# Patient Record
Sex: Female | Born: 1985 | Race: Black or African American | Hispanic: No | Marital: Married | Smoking: Former smoker
Health system: Southern US, Community
[De-identification: ages and names within clinical notes are randomized; demographics above are authoritative.]

## PROBLEM LIST (undated history)

## (undated) ENCOUNTER — Inpatient Hospital Stay (HOSPITAL_COMMUNITY): Payer: BC Managed Care – PPO

## (undated) DIAGNOSIS — F32A Depression, unspecified: Secondary | ICD-10-CM

## (undated) DIAGNOSIS — E1165 Type 2 diabetes mellitus with hyperglycemia: Secondary | ICD-10-CM

## (undated) DIAGNOSIS — F418 Other specified anxiety disorders: Secondary | ICD-10-CM

## (undated) DIAGNOSIS — E119 Type 2 diabetes mellitus without complications: Secondary | ICD-10-CM

## (undated) DIAGNOSIS — I10 Essential (primary) hypertension: Secondary | ICD-10-CM

## (undated) DIAGNOSIS — F419 Anxiety disorder, unspecified: Secondary | ICD-10-CM

## (undated) DIAGNOSIS — F329 Major depressive disorder, single episode, unspecified: Secondary | ICD-10-CM

## (undated) DIAGNOSIS — N970 Female infertility associated with anovulation: Secondary | ICD-10-CM

## (undated) HISTORY — DX: Other specified anxiety disorders: F41.8

## (undated) HISTORY — DX: Female infertility associated with anovulation: N97.0

## (undated) HISTORY — DX: Type 2 diabetes mellitus with hyperglycemia: E11.65

## (undated) HISTORY — DX: Essential (primary) hypertension: I10

## (undated) HISTORY — PX: CHOLECYSTECTOMY: SHX55

## (undated) HISTORY — DX: Morbid (severe) obesity due to excess calories: E66.01

---
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
## 2003-03-10 ENCOUNTER — Emergency Department (HOSPITAL_COMMUNITY): Admission: EM | Admit: 2003-03-10 | Discharge: 2003-03-10 | Payer: Self-pay | Admitting: Emergency Medicine

## 2003-03-10 ENCOUNTER — Encounter: Payer: Self-pay | Admitting: Emergency Medicine

## 2003-07-05 ENCOUNTER — Encounter: Admission: RE | Admit: 2003-07-05 | Discharge: 2003-07-05 | Payer: Self-pay | Admitting: Family Medicine

## 2003-07-05 ENCOUNTER — Other Ambulatory Visit: Admission: RE | Admit: 2003-07-05 | Discharge: 2003-07-05 | Payer: Self-pay | Admitting: Family Medicine

## 2003-07-05 ENCOUNTER — Encounter (INDEPENDENT_AMBULATORY_CARE_PROVIDER_SITE_OTHER): Payer: Self-pay | Admitting: *Deleted

## 2003-12-16 ENCOUNTER — Emergency Department (HOSPITAL_COMMUNITY): Admission: EM | Admit: 2003-12-16 | Discharge: 2003-12-16 | Payer: Self-pay | Admitting: Emergency Medicine

## 2004-04-04 ENCOUNTER — Encounter: Admission: RE | Admit: 2004-04-04 | Discharge: 2004-04-04 | Payer: Self-pay | Admitting: Family Medicine

## 2004-04-13 ENCOUNTER — Encounter: Admission: RE | Admit: 2004-04-13 | Discharge: 2004-04-13 | Payer: Self-pay | Admitting: Sports Medicine

## 2004-06-15 ENCOUNTER — Emergency Department (HOSPITAL_COMMUNITY): Admission: EM | Admit: 2004-06-15 | Discharge: 2004-06-15 | Payer: Self-pay | Admitting: *Deleted

## 2004-06-20 ENCOUNTER — Encounter: Admission: RE | Admit: 2004-06-20 | Discharge: 2004-06-20 | Payer: Self-pay | Admitting: Family Medicine

## 2004-06-20 ENCOUNTER — Other Ambulatory Visit: Admission: RE | Admit: 2004-06-20 | Discharge: 2004-06-20 | Payer: Self-pay | Admitting: Family Medicine

## 2004-06-20 ENCOUNTER — Encounter (INDEPENDENT_AMBULATORY_CARE_PROVIDER_SITE_OTHER): Payer: Self-pay | Admitting: *Deleted

## 2004-06-23 ENCOUNTER — Encounter: Admission: RE | Admit: 2004-06-23 | Discharge: 2004-06-23 | Payer: Self-pay | Admitting: Sports Medicine

## 2004-07-03 ENCOUNTER — Encounter (INDEPENDENT_AMBULATORY_CARE_PROVIDER_SITE_OTHER): Payer: Self-pay | Admitting: *Deleted

## 2004-07-03 LAB — CONVERTED CEMR LAB

## 2004-09-06 ENCOUNTER — Ambulatory Visit: Payer: Self-pay | Admitting: Family Medicine

## 2005-02-22 ENCOUNTER — Emergency Department (HOSPITAL_COMMUNITY): Admission: EM | Admit: 2005-02-22 | Discharge: 2005-02-22 | Payer: Self-pay | Admitting: Family Medicine

## 2005-02-26 ENCOUNTER — Ambulatory Visit: Payer: Self-pay | Admitting: Family Medicine

## 2007-01-31 ENCOUNTER — Encounter (INDEPENDENT_AMBULATORY_CARE_PROVIDER_SITE_OTHER): Payer: Self-pay | Admitting: *Deleted

## 2007-02-13 ENCOUNTER — Emergency Department (HOSPITAL_COMMUNITY): Admission: EM | Admit: 2007-02-13 | Discharge: 2007-02-13 | Payer: Self-pay | Admitting: Family Medicine

## 2007-07-06 ENCOUNTER — Emergency Department (HOSPITAL_COMMUNITY): Admission: EM | Admit: 2007-07-06 | Discharge: 2007-07-06 | Payer: Self-pay | Admitting: Emergency Medicine

## 2007-12-29 ENCOUNTER — Emergency Department (HOSPITAL_COMMUNITY): Admission: EM | Admit: 2007-12-29 | Discharge: 2007-12-29 | Payer: Self-pay | Admitting: Emergency Medicine

## 2008-05-02 ENCOUNTER — Inpatient Hospital Stay (HOSPITAL_COMMUNITY): Admission: AD | Admit: 2008-05-02 | Discharge: 2008-05-02 | Payer: Self-pay | Admitting: Obstetrics and Gynecology

## 2008-05-06 ENCOUNTER — Ambulatory Visit: Payer: Self-pay | Admitting: *Deleted

## 2008-05-06 DIAGNOSIS — E1169 Type 2 diabetes mellitus with other specified complication: Secondary | ICD-10-CM | POA: Insufficient documentation

## 2008-05-06 DIAGNOSIS — I10 Essential (primary) hypertension: Secondary | ICD-10-CM | POA: Insufficient documentation

## 2008-05-06 DIAGNOSIS — E119 Type 2 diabetes mellitus without complications: Secondary | ICD-10-CM | POA: Insufficient documentation

## 2008-05-06 DIAGNOSIS — N939 Abnormal uterine and vaginal bleeding, unspecified: Secondary | ICD-10-CM

## 2008-05-06 DIAGNOSIS — E1165 Type 2 diabetes mellitus with hyperglycemia: Secondary | ICD-10-CM

## 2008-05-06 DIAGNOSIS — IMO0002 Reserved for concepts with insufficient information to code with codable children: Secondary | ICD-10-CM

## 2008-05-06 DIAGNOSIS — N926 Irregular menstruation, unspecified: Secondary | ICD-10-CM | POA: Insufficient documentation

## 2008-05-06 HISTORY — DX: Type 2 diabetes mellitus with hyperglycemia: E11.65

## 2008-05-06 HISTORY — DX: Morbid (severe) obesity due to excess calories: E66.01

## 2008-05-06 HISTORY — DX: Essential (primary) hypertension: I10

## 2008-05-06 HISTORY — DX: Reserved for concepts with insufficient information to code with codable children: IMO0002

## 2008-05-06 LAB — CONVERTED CEMR LAB
Beta hcg, urine, semiquantitative: NEGATIVE
FSH: 6.4 milliintl units/mL
HCT: 37.4 % (ref 36.0–46.0)
Hemoglobin: 11.8 g/dL — ABNORMAL LOW (ref 12.0–15.0)
LH: 10.9 milliintl units/mL
MCHC: 31.6 g/dL (ref 30.0–36.0)
MCV: 83.7 fL (ref 78.0–100.0)
Platelets: 322 10*3/uL (ref 150–400)
Prolactin: 8.4 ng/mL
RBC: 4.47 M/uL (ref 3.87–5.11)
RDW: 14.9 % (ref 11.5–15.5)
Sex Hormone Binding: 26 nmol/L (ref 18–114)
TSH: 1.745 microintl units/mL (ref 0.350–5.50)
Testosterone Free: 11.4 pg/mL — ABNORMAL HIGH (ref 0.6–6.8)
Testosterone-% Free: 2.1 % (ref 0.4–2.4)
Testosterone: 55.44 ng/dL (ref 10–70)
WBC: 5.6 10*3/uL (ref 4.0–10.5)

## 2008-07-29 ENCOUNTER — Emergency Department (HOSPITAL_COMMUNITY): Admission: EM | Admit: 2008-07-29 | Discharge: 2008-07-29 | Payer: Self-pay | Admitting: Emergency Medicine

## 2008-09-01 ENCOUNTER — Encounter: Admission: RE | Admit: 2008-09-01 | Discharge: 2008-10-07 | Payer: Self-pay | Admitting: Occupational Medicine

## 2009-01-24 ENCOUNTER — Ambulatory Visit: Payer: Self-pay | Admitting: Family Medicine

## 2009-01-25 ENCOUNTER — Encounter: Payer: Self-pay | Admitting: Family Medicine

## 2009-02-07 ENCOUNTER — Ambulatory Visit: Payer: Self-pay | Admitting: Family Medicine

## 2009-02-10 ENCOUNTER — Encounter: Payer: Self-pay | Admitting: Sports Medicine

## 2009-08-12 ENCOUNTER — Encounter: Admission: RE | Admit: 2009-08-12 | Discharge: 2009-08-12 | Payer: Self-pay | Admitting: Internal Medicine

## 2009-09-09 ENCOUNTER — Ambulatory Visit (HOSPITAL_COMMUNITY): Admission: RE | Admit: 2009-09-09 | Discharge: 2009-09-09 | Payer: Self-pay | Admitting: Obstetrics

## 2009-12-09 ENCOUNTER — Encounter: Admission: RE | Admit: 2009-12-09 | Discharge: 2009-12-09 | Payer: Self-pay | Admitting: Occupational Medicine

## 2009-12-09 ENCOUNTER — Encounter: Payer: Self-pay | Admitting: Family Medicine

## 2010-01-09 ENCOUNTER — Ambulatory Visit: Payer: Self-pay | Admitting: Family Medicine

## 2010-01-09 DIAGNOSIS — M25559 Pain in unspecified hip: Secondary | ICD-10-CM | POA: Insufficient documentation

## 2010-01-09 DIAGNOSIS — M25569 Pain in unspecified knee: Secondary | ICD-10-CM | POA: Insufficient documentation

## 2010-01-10 ENCOUNTER — Encounter: Payer: Self-pay | Admitting: Family Medicine

## 2010-05-10 ENCOUNTER — Encounter: Payer: Self-pay | Admitting: Family Medicine

## 2010-07-05 ENCOUNTER — Ambulatory Visit: Payer: Self-pay | Admitting: Obstetrics & Gynecology

## 2010-07-05 ENCOUNTER — Inpatient Hospital Stay (HOSPITAL_COMMUNITY): Admission: AD | Admit: 2010-07-05 | Discharge: 2010-07-05 | Payer: Self-pay | Admitting: Obstetrics & Gynecology

## 2010-10-04 ENCOUNTER — Encounter
Admission: RE | Admit: 2010-10-04 | Discharge: 2010-11-21 | Payer: Self-pay | Source: Home / Self Care | Attending: Occupational Medicine | Admitting: Occupational Medicine

## 2011-01-02 NOTE — Miscellaneous (Signed)
Summary: [facility]  [facility]   Imported ByRanda Evens WATT 02/10/2009 11:21:21  _____________________________________________________________________  External Attachment:    Type:   Image     Comment:   External Document

## 2011-01-02 NOTE — Letter (Signed)
Summary: Generic Letter  [facility]  [address]   [place], [place] [address]   Phone: [phone]  Fax: [phone]    05/10/2010  ANDRENA MARGERUM [facility] [address] [place], [place]  [address]  Dear Ms. ISLEY,   We have not seen you at the [facility] clinic in over a year.  With your diabetes and blood pressure it is important to have regular medical care.  Please call us for an appointment or call us to let us know if you have found another doctor.     Sincerely,   Eustaquio Boyden  MD   Appended Document: Generic Letter letter mailed.

## 2011-01-02 NOTE — Assessment & Plan Note (Signed)
Summary: NP W/LEFT KNEE/HIP PAIN WORKERS COMP/JW   Vital Signs:  Patient Profile:   25 Years Old Female Weight:      363 pounds Pulse rate:   88 / minute BP sitting:   142 / 81  Vitals Entered By: Lillia Pauls CMA (January 24, 2009 3:45 PM)                 PCP:  Wilburt Finlay MD  Chief Complaint:  NP WC WITH L HIP AND L KNEE PAIN.  History of Present Illness: new patient sent for further evaluation and management by Dr Marny Lowenstein of left knee and hip pain, initial date of injury 07/29/08.  Fell forward onto knees on a carpeted (over cement or tile she thinks) floor. Had immediate knee pain and swelling. No initial hip pain, that occurred a few weeks later.  Knee pain is sharp,intermittent, at times is 10/10, other times 4/10. worse with prolonged standing or stair climbing. Hurts too bad to kneel on that knee. Initially had a lot of swelling--now intermittently it feels somewhat tight and looks a little puffy. Knee does not fell like it is going to give out, no leg weakness  After a few weeks from her initial fall she began to have rleft hip pain--that has worsened over time. she describes it as aching, always present, 6-8/10, worse with movement or extended standing. No giving way or catching sensation.  Pertinent PMH/PSH :long hx of morbid obesity, no hx of injurty or surgery to either hip or knee. Pertinent SH: she works 2 jobs, does a lot of standing and weight bearing.    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 Discussed possible complications of infection, bleeding or skin atrophy at site of injection. Possible side effect of avascular  necrosis (focal area of bone death), site atrophy, skin lightening  due to steroid use.Appropriate verbal time out taken Are cleaned and prepped in usual sterile fashion. A -2 cc--- cc kennalog plus ---4-cc 1% lidocaine without epinephrine was injected into the---left greater trochanteric bursa using a 3 1/2 inch 22 g spinal needle. Patient tolerated procedure well with no complications.   Patient given informed consent for injection. Discussed possible complications of infection, bleeding or skin atrophy at site of injection. Possible side effect of avascular necrosis (focal area of bone death) due to steroid use.Appropriate verbal time out taken Are cleaned and prepped in usual sterile fashion. A -1/2 cc--- cc kennalog plus ---1.5 cc-cc 1% lidocaine without epinephrine was injected into the--prepatellar bursa-. Patient tolerated procedure well with no complications.    Knee Exam  Knee Exam:    Right:    Inspection:  Normal    Palpation:  Normal    Left:    Palpation:  Abnormal    Stability:  stable    Tenderness:  patellar    Swelling:  no    Erythema:  no    left knee is very tender over area of pre patellar bursa. no tenderness of medial or lateral joint lines. Knee seems  ligamentously intact and I see no evident joint effusion--this exam is a little limited by habitus so we did US exam of knee and saw no effusion--there was a large collection of fluid in teh prepatellar bursa   Hip Exam  Hip Exam:    Right:    Inspection:  Normal    Palpation:  Normal    Stability:  stable    Tenderness:  no    Swelling:  no    Erythema:  no    Range of Motion:       Flexion-Active: 135       Extension-Active: 50       Internal Rotation-Active: 35       External Rotation-Active: 80    Left:    Inspection:  Normal    Palpation:  Abnormal       Location:  trochanteric bursa    Stability:  stable    Tenderness:  trochanteric bursa    Swelling:  no    Erythema:  no    Range of Motion:        Flexion-Active: 135       Extension-Active: 50       Internal Rotation-Active: 35       External Rotation-Active: 80    No pain on IR or ER. She has a little limit to her ROM due to habitus, but symmetrical motion r and l sides.      Impression & Recommendations:  Problem # 1:  GREATER TROCHANTERIC BURSITIS (ICD-726.5) Assessment: New I suspect her hip is bothering her from altered gait after her knee injury. we did injection today and will f/u in 1 week at which time we will discuss rehab program Orders: Joint Aspirate / Injection, Intermediate (20605)   Problem # 2:  BURSITIS, LEFT KNEE (ICD-726.60) prepatellar bursitis which sounds chronic at this point. Will try injection--rtc 1 week. discussed weight loss. Will discuss knee strengthenong program at next visit Orders: Joint Aspirate / Injection, Small ([id number])   Complete Medication List: 1)  Ortho-cyclen (28) 0.25-35 Mg-mcg Tabs (Norgestimate-eth estradiol) .... One tablet by mouth once daily as directed

## 2011-01-02 NOTE — Assessment & Plan Note (Signed)
Summary: FU HIP/JW   Vital Signs:  Patient Profile:   25 Years Old Female Pulse rate:   66 / minute BP sitting:   140 / 82  Vitals Entered By: Lillia Pauls CMA (February 07, 2009 3:03 PM)                 PCP:  Wilburt Finlay MD  Chief Complaint:  FU HIP PAIN.  History of Present Illness: f/u knee and hip pain. Both injections helped her--her knee is 75% better and her hip about 50% better. We had talked about starting a rehab program and she is ready for instruction        Physical Exam  General:     overweight-appearing.     Hip Exam  Hip Exam:    Right:    Inspection:  Normal    Palpation:  Normal    Left:    Inspection:  Normal       Location:  mild tenderness to deep palpation over greater trochanteric areas B   Knee Exam  Knee Exam:    Right:    Inspection:  Normal       Location:  very slight  tenderness prepatellar bursa but improved from previous exam    Painless full flexion and extension of knee.    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 One tablet by mouth once daily as directed

## 2011-01-02 NOTE — Letter (Signed)
Summary: *Consult Note  [facility]  [address]   [place], [place] [address]   Phone: [phone]  Fax: [phone]    Re:    Krista Adams DOB:    July 10, 1986   Dear:    Danae Chen you for requesting that we see the above patient for consultation.  A copy of the detailed office note will be sent under separate cover, for your review.  Evaluation today is consistent with: chronic prepatellar bursitis, greater trochanteric (hip ) bursitis   Our recommendation is for: we did injection therapy at todays visit--will consider rehab when she returns and see how she did with the injectiom. We also briefly discussed her obesity which she is aware contributes to the problem.   New Orders include: NONE   New Medications started today include: NONE:    After today's visit, the patients current medications include: 1)  ORTHO-CYCLEN (28) 0.25-35 MG-MCG  TABS (NORGESTIMATE-ETH ESTRADIOL) One tablet by mouth once daily as directed   Thank you for this consultation.  If you have any further questions regarding the care of this patient, please do not hesitate to contact me @ [phone] pager [phone]  Thank you for this opportunity to look after your patient.  Sincerely,   Denny Levy MD

## 2011-01-02 NOTE — Letter (Signed)
Summary: *Consult Note  [facility]  [address]   [place], [place] [address]   Phone: [phone]  Fax: [phone]    Re:    Krista Adams DOB:    04-24-1986   Dear:    Danae Chen you for requesting that we see the above patient for consultation.  A copy of the detailed office note will be sent under separate cover, for your review.  Evaluation today is consistent with: 1)knee contusion, no evidence of pre-patellar bursitis.2) chronic greater trochanteric bursitis.   Our recommendation is for:1) No work limitations.  2)weight reduction.  3)resume exercise program for greater trochanteric bursitis   N o medicine changes. I have referred her back to you for further discussion of work status.  After today's visit, the patients current medications include: 1)  ORTHO-CYCLEN (28) 0.25-35 MG-MCG  TABS (NORGESTIMATE-ETH ESTRADIOL) One tablet by mouth once daily as directed   Thank you for this consultation.  If you have any further questions regarding the care of this patient, please do not hesitate to contact me @ [phone].  Thank you for this opportunity to look after your patient.  Sincerely,   Denny Levy MD

## 2011-01-02 NOTE — Assessment & Plan Note (Signed)
Summary: WC,LEG/HIP PAIN,MC   Vital Signs:  Patient profile:   25 year old female Height:      65 inches Weight:      350 pounds BMI:     58.45 BP sitting:   133 / 85  Vitals Entered By: Lillia Pauls CMA (January 09, 2010 3:28 PM)  Primary Care Provider:  Eustaquio Boyden  MD  CC:  L knee and hip pain.  History of Present Illness: Seen in consult at request of Dr Alto Denver for furthr eval/management of left knee and left hip pain.  25 y/o morbidly obese female here for L knee and hip pain following fall at work1/06/2010. Second fall at work in lthe last 18 months. Had a similar injury >1 year ago. pt  evaluated at [facility] then and was given steroid injections in her knee and hip which she states did not help. She reports some degree of knee and hip pain have persisted since then,  She describes pain over left knee cap  area . Also  has  pain occasional shoots up in L hip area. pain Both of these types of pain areworsened by prolonged standing, but not necessarily made worse by walking. No or sensation that her knee is going to give out. Describes pain as 8/10. rest helps some.  She is currently working seated duty only.  Allergies (verified): No Known Drug Allergies  Review of Systems  The patient denies fever, weight loss, weight gain, muscle weakness, and difficulty walking.    Physical Exam  General:  morbidly obese female, NAD.  Msk:  Left knee has no obvious effusion, however exam is limited by habitus. She will barely let me examine her knee--even just palpating the patella and joint lines, because she says it is too painful. For this reasone, and with her informed consent, I injected 1 1/2 cc of 1% lidocaine into the pore-patellar bursa area so we could perform necessary exam maneuvers. After the injection, which she tolerated very well, exam of her patella revealed no ballottment, no crepitus. Her medial and lateral joint line were non tender to deep palpation. She has full  extension and flexion. The calf is non tender. Anterior drawer test is nomral with good end point.   Appley compression test was negative.  B hips have intact and full IR/ER that is painless. Minimally tender to palpation over area of greater trochanteric bursa. Gait is a bit wide stanced secondary to her habitus, but otherwise normal.   Additional Exam:  US exam was performed. Exam limited somewhat by large amount of adiposity but no effusion was noted. No mensical pathology was noted. The popliteal space did not have any cystic lesions.   Impression & Recommendations:  Problem # 1:  KNEE PAIN, LEFT (ICD-719.46) no evidence of bursitis on exam or U/S imaging. I see no obvious meniscal pathology and combined with negative clinical exam I think this is reassuring that we are not dealing with an acute meniscal tear. Given her habitus, she likely has some relatively early thinning and degeneration of both knee menisci, but I do not see anything acute. I think her pain is from a knee contusion and discussed this with her. She seemed reassured to some extent. Issuspect that it will continue to be painful for next 4-6 weeks (natural course of bone bruise may take 2-3 months before pain resolves totally. She does not seem to be in danger of further injuring her knee by returning to regular work status. I  told her to check back with you regarding her work capacity/status.  Problem # 2:  HIP PAIN, LEFT (ICD-719.45) patient reports minimal improvement with steroid injection in past; therefore, injection deferred. no further work up for now. She likely has some component of chronic greater trochanteric bursitis. We discussed briefly with her that weight reduction may help this and I advised her to return to her exercises (esp the ABduction leg raises form a lying on side position. She expressed understanding.  Complete Medication List: 1)  Ortho-cyclen (28) 0.25-35 Mg-mcg Tabs (Norgestimate-eth estradiol)  .... One tablet by mouth once daily as directed

## 2011-01-02 NOTE — Assessment & Plan Note (Signed)
Summary: bleeding x 3 wks wp   Vital Signs:  Patient Profile:   25 Years Old Female Weight:      346.2 pounds Temp:     98.4 degrees F Pulse rate:   84 / minute BP sitting:   138 / 85  (left arm)  Pt. in pain?   no  Vitals Entered By: Jacki Cones RN (May 06, 2008 3:53 PM)                  Visit Type:  Acute visit PCP:  Wilburt Finlay MD  Chief Complaint:  vaginal bleeding x3 weeks.  History of Present Illness:     This is a 25 year old female gravid 0 presenting with vaginal bleeding for three weeks.  She states she was seen at [facility] on Sunday and was evaluated with ultrasound and blood tests.  She was set up with an appointment at GYN clinic at [facility] next week, but she is leaving to go to [place] for a week and wanted to be seen earlier.  She states she has had bleeding requiring two to three pads/tampons a day for the past three weeks.  She has also noted some clots.  She states her periods are very irregular and it was three months time between this current episode of bleeding and her last period.   Her periods are anywhere from 1-3 months apart.  She states they have always been about seven days duration requiring 2-3 pads on the heaviest day.  She has never had bleeding that soaks through to her underwaer or to the sheets when sleeping.  In review of her records the [place] showed a uterus that is normal in size with normal endometrium.  Ovaries were dificult to visualize.  Her hemoglobin was in the 12 range.  She does have a history of hypertension and diabetes.  She denies smoking or history of deep venous thromboembolism.      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  CV      Denies chest pain or discomfort, fainting, fatigue, and lightheadness.  Resp      Denies cough.  GI      Complains of abdominal pain.      Denies change in bowel habits, constipation, diarrhea, nausea, and vomiting.  GU      Complains of abnormal vaginal bleeding.      Denies discharge and dysuria.   Physical Exam  General:     Well-appearing, morbidly obese female in no distress Head:     Normocephalic and atraumatic without obvious abnormalities. No apparent alopecia or balding. Lungs:     Normal respiratory effort, chest expands symmetrically. Lungs are clear to auscultation, no crackles or wheezes. Heart:     Normal rate and regular rhythm. S1 and S2 normal without gallop, murmur, click, rub or other extra sounds. Abdomen:     Soft, obese, mild  tenderness in suprapubic and bilateral lower quadrants without rebound tenderness or guarding.  Bowel sounds noted in all quadrants. Genitalia:     Normal external genitalia.  Vaginal mucosa is pink and moist.  Small amoutn of dark red blood in vagina.  Cervix is midline with blood from os.  Uterus and ovaries not palpable on bimanual exam secondary to body habitus.  Mild tenderness with displacement of uterus anteriorly and to right.   Neurologic:     No cranial nerve deficits noted. Station and gait are normal. Plantar reflexes are down-going bilaterally. DTRs are symmetrical throughout. Sensory, motor and coordinative functions appear intact. Psych:     Cognition and judgment appear intact. Alert and cooperative with normal attention span and concentration. No apparent delusions, illusions, hallucinations    Impression & Recommendations:  Problem # 1:  VAGINAL BLEEDING, ABNORMAL (ICD-626.9)    Will evaluate  today for sources of abnormal bleeding with TSH, prolaction, FSH, LH, testosterone.  Urine pregnancy negative.  Though limited ultrasoudn in past week was normal.  Patient likely with PCOS.  Will give prescription of OCP to control bleeding for now, but patient needs close follow-up.  She understands this and plans to follow at at clinic and at [facility] GYN clinic.   Her updated medication list for this problem includes:    Ortho-cyclen (28) 0.25-35 Mg-mcg Tabs (Norgestimate-eth estradiol) ..... One tablet by mouth once daily as directed  Orders: U Preg-[facility] (81025) TSH-[facility] [phone]) Prolactin-[facility] [phone]) Testosterone-[facility] [phone]) Miscellaneous Lab Charge-[facility] [phone]) CBC-[facility] (20254) [facility]- Est Level  3 (27062) LH-[facility] ([id number]) FSH-[facility] ([id number])   Problem # 2:  MORBID OBESITY (ICD-278.01)  Orders: [facility]- Est Level  3 (26948)   Problem # 3:  DIABETES MELLITUS, TYPE II, CONTROLLED (ICD-250.00) On Janumet but does not know dosing.  Problem # 4:  ESSENTIAL HYPERTENSION, BENIGN (ICD-401.1) On Benicar-HCT but does not know dosing.    Complete Medication List: 1)  Ortho-cyclen (28) 0.25-35 Mg-mcg Tabs (Norgestimate-eth estradiol) .... One tablet by mouth once daily as directed   Patient Instructions: 1)  Please schedule a follow-up appointment in 2 weeks. 2)  Use birth control pill as directed.   3)  Follow-up sooner if you have any concerns.     Prescriptions: ORTHO-CYCLEN (28) 0.25-35 MG-MCG  TABS (NORGESTIMATE-ETH ESTRADIOL) One tablet by mouth once daily as directed  #One pack x 1   Entered and Authorized by:   Karlton Lemon MD   Signed by:   Karlton Lemon MD on 05/06/2008   Method used:   Print then Give to Patient   RxID:   [id number]  ] Laboratory Results   Urine Tests  Date/Time Received: May 06, 2008 4:23 PM  Date/Time Reported: May 06, 2008 4:39 PM     Urine HCG: negative Comments: ...........test performed  by...........Marland KitchenTerese Door, CMA

## 2011-01-02 NOTE — Consult Note (Signed)
Summary: [facility]  [facility]   Imported By: Marily Memos 02/28/2010 13:41:51  _____________________________________________________________________  External Attachment:    Type:   Image     Comment:   External Document

## 2011-01-22 ENCOUNTER — Encounter: Payer: Self-pay | Admitting: *Deleted

## 2011-02-16 LAB — SAMPLE TO BLOOD BANK

## 2011-02-16 LAB — WET PREP, GENITAL
Trich, Wet Prep: NONE SEEN
Yeast Wet Prep HPF POC: NONE SEEN

## 2011-02-16 LAB — CBC
HCT: 38.9 % (ref 36.0–46.0)
Hemoglobin: 12.9 g/dL (ref 12.0–15.0)
MCH: 28.5 pg (ref 26.0–34.0)
MCHC: 33.1 g/dL (ref 30.0–36.0)
MCV: 86.1 fL (ref 78.0–100.0)
Platelets: 305 10*3/uL (ref 150–400)
RBC: 4.52 MIL/uL (ref 3.87–5.11)
RDW: 15 % (ref 11.5–15.5)
WBC: 4.5 10*3/uL (ref 4.0–10.5)

## 2011-02-16 LAB — GC/CHLAMYDIA PROBE AMP, GENITAL
Chlamydia, DNA Probe: NEGATIVE
GC Probe Amp, Genital: NEGATIVE

## 2011-02-16 LAB — POCT PREGNANCY, URINE: Preg Test, Ur: NEGATIVE

## 2011-08-23 LAB — INFLUENZA A AND B ANTIGEN (CONVERTED LAB)
Inflenza A Ag: NEGATIVE
Influenza B Ag: NEGATIVE

## 2011-08-23 LAB — POCT RAPID STREP A: Streptococcus, Group A Screen (Direct): NEGATIVE

## 2011-08-29 LAB — URINALYSIS, ROUTINE W REFLEX MICROSCOPIC
Bilirubin Urine: NEGATIVE
Glucose, UA: NEGATIVE
Ketones, ur: NEGATIVE
Leukocytes, UA: NEGATIVE
Nitrite: NEGATIVE
Protein, ur: NEGATIVE
Specific Gravity, Urine: >1.03 — ABNORMAL HIGH
Urobilinogen, UA: 0.2
pH: 5.5

## 2011-08-29 LAB — GC/CHLAMYDIA PROBE AMP, GENITAL
Chlamydia, DNA Probe: NEGATIVE
GC Probe Amp, Genital: NEGATIVE

## 2011-08-29 LAB — URINE MICROSCOPIC-ADD ON

## 2011-08-29 LAB — BASIC METABOLIC PANEL
BUN: 12
CO2: 28
Calcium: 9.3
Chloride: 104
Creatinine, Ser: 0.72
GFR calc Af Amer: >60
GFR calc non Af Amer: >60
Glucose, Bld: 155 — ABNORMAL HIGH
Potassium: 4.1
Sodium: 139

## 2011-08-29 LAB — CBC
HCT: 38.2
Hemoglobin: 12.7
MCHC: 33.3
MCV: 84.3
Platelets: 325
RBC: 4.52
RDW: 14.7
WBC: 6.4

## 2011-08-29 LAB — WET PREP, GENITAL
Trich, Wet Prep: NONE SEEN
Yeast Wet Prep HPF POC: NONE SEEN

## 2011-08-29 LAB — POCT PREGNANCY, URINE
Operator id: 13340
Preg Test, Ur: NEGATIVE

## 2011-09-21 ENCOUNTER — Ambulatory Visit (INDEPENDENT_AMBULATORY_CARE_PROVIDER_SITE_OTHER): Payer: Self-pay | Admitting: Family Medicine

## 2011-09-21 ENCOUNTER — Encounter: Payer: Self-pay | Admitting: Family Medicine

## 2011-09-21 VITALS — BP 157/94 | HR 66 | Temp 98.3°F | Ht 64.0 in | Wt 348.0 lb

## 2011-09-21 DIAGNOSIS — D2370 Other benign neoplasm of skin of unspecified lower limb, including hip: Secondary | ICD-10-CM

## 2011-09-21 DIAGNOSIS — Z23 Encounter for immunization: Secondary | ICD-10-CM

## 2011-09-21 DIAGNOSIS — I10 Essential (primary) hypertension: Secondary | ICD-10-CM

## 2011-09-21 DIAGNOSIS — D2270 Melanocytic nevi of unspecified lower limb, including hip: Secondary | ICD-10-CM

## 2011-09-21 DIAGNOSIS — IMO0001 Reserved for inherently not codable concepts without codable children: Secondary | ICD-10-CM

## 2011-09-21 LAB — CBC
HCT: 42 % (ref 36.0–46.0)
Hemoglobin: 13.4 g/dL (ref 12.0–15.0)
MCH: 27.2 pg (ref 26.0–34.0)
MCHC: 31.9 g/dL (ref 30.0–36.0)
MCV: 85.4 fL (ref 78.0–100.0)
Platelets: 303 10*3/uL (ref 150–400)
RBC: 4.92 MIL/uL (ref 3.87–5.11)
RDW: 14.4 % (ref 11.5–15.5)
WBC: 3.8 10*3/uL — ABNORMAL LOW (ref 4.0–10.5)

## 2011-09-21 LAB — COMPREHENSIVE METABOLIC PANEL
ALT: <8 U/L (ref 0–35)
AST: 12 U/L (ref 0–37)
Albumin: 4.2 g/dL (ref 3.5–5.2)
Alkaline Phosphatase: 106 U/L (ref 39–117)
BUN: 10 mg/dL (ref 6–23)
CO2: 25 mEq/L (ref 19–32)
Calcium: 9.8 mg/dL (ref 8.4–10.5)
Chloride: 99 mEq/L (ref 96–112)
Creat: 0.6 mg/dL (ref 0.50–1.10)
Glucose, Bld: 304 mg/dL — ABNORMAL HIGH (ref 70–99)
Potassium: 4.3 mEq/L (ref 3.5–5.3)
Sodium: 137 mEq/L (ref 135–145)
Total Bilirubin: 0.6 mg/dL (ref 0.3–1.2)
Total Protein: 7.4 g/dL (ref 6.0–8.3)

## 2011-09-21 LAB — LIPID PANEL
Cholesterol: 151 mg/dL (ref 0–200)
HDL: 39 mg/dL — ABNORMAL LOW (ref >39)
LDL Cholesterol: 87 mg/dL (ref 0–99)
Total CHOL/HDL Ratio: 3.9 Ratio
Triglycerides: 124 mg/dL (ref <150)
VLDL: 25 mg/dL (ref 0–40)

## 2011-09-21 LAB — POCT GLYCOSYLATED HEMOGLOBIN (HGB A1C): Hemoglobin A1C: 9.6

## 2011-09-21 NOTE — Patient Instructions (Signed)
It was nice meeting you today.  I would like for you to change over to Lantus as your long acting insulin.  I will send this over to the [facility] program.  Once you get this I would like for you to start at 75 units in the morning.  I would like for you to increase this by one unit each day that your blood glucose is over 150.  I will also forward your name to our health coach so that you can meet with her.  I will see you back in two weeks.  Please be sure to bring all your medications and what your sliding scale is.  Please take your blood pressure medicine before coming so that I can see what your blood pressure looks like on your medication.

## 2011-09-23 MED ORDER — INSULIN GLARGINE 100 UNIT/ML ~~LOC~~ SOLN: 75.0000 [IU] | SUBCUTANEOUS | Status: DC

## 2011-09-24 DIAGNOSIS — D2270 Melanocytic nevi of unspecified lower limb, including hip: Secondary | ICD-10-CM | POA: Insufficient documentation

## 2011-09-24 NOTE — Assessment & Plan Note (Signed)
>>  ASSESSMENT AND PLAN FOR DM (DIABETES MELLITUS), TYPE 2, UNCONTROLLED WRITTEN ON 09/24/2011 10:21 PM BY MATTHEWS, CODY   Hemoglobin A1c indicates that patient is still uncontrolled regarding her Glucose.   I would like to change her over to Lantus just because this is slightly longer acting and it may help with A little better glycemic control, she is agreeable to this. I will start her Lantus at 75 units and have her go up  Heone unit each day that her blood glucose is over 150. Unfortunately we do not have samples of Lantus today, but she think she has enough levemir to get her until she can get this filled at the [facility] program.  I did give her samples of NovoLog to hopefully get her to where she can get this filled at the [facility] program. I will have her keep a log of her sugars for the next couple weeks to see where we need to adjust her insulin.

## 2011-09-24 NOTE — Assessment & Plan Note (Addendum)
Hemoglobin A1c indicates that patient is still uncontrolled regarding her Glucose.   I would like to change her over to Lantus just because this is slightly longer acting and it may help with A little better glycemic control, she is agreeable to this. I will start her Lantus at 75 units and have her go up  Heone unit each day that her blood glucose is over 150. Unfortunately we do not have samples of Lantus today, but she think she has enough levemir to get her until she can get this filled at the [facility] program.  I did give her samples of NovoLog to hopefully get her to where she can get this filled at the [facility] program. I will have her keep a log of her sugars for the next couple weeks to see where we need to adjust her insulin.

## 2011-09-24 NOTE — Assessment & Plan Note (Signed)
Spot consistent with  Nevus Of the toe.  There is no asymmetry, irregular border, or color change. I reassured patient that this looks like a normal mole. I advised her to keep an eye on this area to be sure that it is unchanging.

## 2011-09-24 NOTE — Assessment & Plan Note (Signed)
Blood pressure elevated today, however patient has not taken her blood pressure medication.  A letter to her blood pressure is on her blood pressure medication I will have her  Continue the Benicar recheck her blood pressure  When she returns in 2 weeks.

## 2011-09-24 NOTE — Progress Notes (Signed)
  Subjective:    Patient ID: Krista Adams, female    DOB: [birth year], 25 y.o.   MRN: [id number]  HPI  1. Diabetes mellitus:  Patient is here to followup on her diabetes. She is recently returned to our office due to losing her insurance. She was being seen by Dr. Dorothyann Peng. She currently reports that she is on NovoLog sliding scale, Levemir 76 units q.a.m., victoza, and metformin. She has been out of NovoLog for the past 3 days. She is unsure of what her NovoLog sliding scale Is off the top of her head. She does still have a small amount of Levemir. She is currently applying for the orange- card and has been  Approved for the map program and hopes to have this in place by next week. Her sugars fasting have been in the low 200s on her current regimen. She typically runs in the 300s most of the time. She has been working on weight loss and has lost 20 pounds over the past year. She currently does walk for exercise.  2. Hypertension: Currently on Benicar for her hypertension. She has not taken his medication today. She states that when she has checked her blood pressure on her medication that she has been well controlled and has been well controlled at her previous physician's office on Benicar. She denies any headaches, chest pain, shortness of breath, nausea, vision changes. 3. Spot On foot: Has noticed dark area on side of her  Right third toe. Has been there for quite some time. Does not think it is change in size or shape. Area is nonpainful.   Review of Systems     Objective:   Physical Exam  Constitutional: No distress.       Obese   Neck: Neck supple. No thyromegaly present.  Cardiovascular: Normal rate and regular rhythm.  Exam reveals no gallop and no friction rub.   No murmur heard. Pulmonary/Chest: Effort normal and breath sounds normal. No respiratory distress.  Musculoskeletal: She exhibits no edema.  Neurological: She is alert.  Skin: Skin is warm and dry.            Assessment & Plan:

## 2011-10-15 NOTE — Progress Notes (Signed)
Scheduled co-visit for 11/14 2:15 pm.

## 2011-10-17 ENCOUNTER — Encounter: Payer: Self-pay | Admitting: Family Medicine

## 2011-10-17 ENCOUNTER — Ambulatory Visit (INDEPENDENT_AMBULATORY_CARE_PROVIDER_SITE_OTHER): Payer: Self-pay | Admitting: Family Medicine

## 2011-10-17 DIAGNOSIS — IMO0001 Reserved for inherently not codable concepts without codable children: Secondary | ICD-10-CM

## 2011-10-17 DIAGNOSIS — Z309 Encounter for contraceptive management, unspecified: Secondary | ICD-10-CM

## 2011-10-17 MED ORDER — LIRAGLUTIDE 18 MG/3ML ~~LOC~~ SOLN: 1.8000 mg | Freq: Every day | SUBCUTANEOUS | Status: DC

## 2011-10-17 MED ORDER — INSULIN ASPART 100 UNIT/ML ~~LOC~~ SOLN: SUBCUTANEOUS | Status: DC

## 2011-10-17 MED ORDER — INSULIN GLARGINE 100 UNIT/ML ~~LOC~~ SOLN: 75.0000 [IU] | SUBCUTANEOUS | Status: DC

## 2011-10-17 MED ORDER — METFORMIN HCL 1000 MG PO TABS: 1000.0000 mg | ORAL_TABLET | Freq: Every day | ORAL | Status: DC

## 2011-10-17 MED ORDER — OLMESARTAN MEDOXOMIL-HCTZ 40-25 MG PO TABS: 1.0000 | ORAL_TABLET | Freq: Every day | ORAL | Status: DC

## 2011-10-17 NOTE — Progress Notes (Signed)
  Subjective:    Patient ID: Krista Adams, female    DOB: [birth year], 25 y.o.   MRN: [id number]  HPI DIABETES Disease Monitoring: Blood Sugar ranges-140-230 Polyuria/phagia/dipsia- Denies      Visual problems- Denies  Medications: Compliance- Pt reports taking medications when she has them Hypoglycemic symptoms- none  Has had difficulty affording medications recently.  She is now enrolled in the MAP program and will be getting her medications through there.  OBESITY Current weight/BMI :  350 lbs 57 BMI   How long have been obese:  10 + years Course:  worsening Problems or symptoms it causes:  DM, HTN   Things have tried to improve:  Portion reduction/exercise   Patient Identified Concern:  Weight loss, future pregnancy Stage of Change Patient Is In:  Contemplation (thinking about making changes in next 6 months.) Patient Reported Barriers:  Laziness, habit, boyfriend doesn't support some of her behaviors. Patient Reported Perceived Benefits:  Feeling better, looking good, pregnancy Patient Reports Self-Efficacy:   Pt displays some self-efficacy Behavior Change Supports:  Mother is trying to lose weight, boyfriend occasionally works on weight loss.  Goals:  Lose weight, manage HTN/DM, pregnancy Patient Education:  We dicussed importance of exercise, types of exercise, eating small meals throughout the day versus once or twice a day.   CONTRACEPTION Education given regarding options for contraception, including barrier methods, injectable contraception, oral contraceptives.  She wishes to get pregnant at some point but not currently, as she desires to lose weight first.     Review of Systems     Objective:   Physical Exam  Constitutional:       Obese, pleasant, NAD         Assessment & Plan:

## 2011-10-17 NOTE — Patient Instructions (Addendum)
1. Follow up with MAP.  2. Start walking plan, follow up with Rosalita Chessman [phone] by next Wednesday.  3. Continue taking blood sugar and blood pressure every day.  4. Try keeping a food/activity journal for 24 hours.

## 2011-10-17 NOTE — Assessment & Plan Note (Signed)
Assessment:  Condition worsening. 2 lb weight gain since last appointment.     Plan: Pt concerned about weight gain and has set goal to start walking several times a week.  Pt will also start keeping food/activity journal to be reviewed.  Continued counseling with health educator and weekly phone calls.

## 2011-10-19 ENCOUNTER — Other Ambulatory Visit: Payer: Self-pay | Admitting: Family Medicine

## 2011-10-19 MED ORDER — INSULIN GLARGINE 100 UNIT/ML ~~LOC~~ SOLN: 75.0000 [IU] | SUBCUTANEOUS | Status: DC

## 2011-10-19 MED ORDER — INSULIN ASPART 100 UNIT/ML ~~LOC~~ SOLN: SUBCUTANEOUS | Status: DC

## 2011-10-19 NOTE — Telephone Encounter (Signed)
Ms. Ernest Mallick is calling about her MAP medicaitons.  She said that she thinks the Rx was for a vial, but she prefers the Pen for the Novolog.  Please call her if you have any questions.  They also need to know if it is the 70/30 or regular.  She needs this to happen today because she is completely out.

## 2011-10-19 NOTE — Telephone Encounter (Signed)
Calling back and wanted to reiterate any novolog that is called in needs to be in the pens.

## 2011-10-19 NOTE — Telephone Encounter (Signed)
Fwd. To PCP .Daiton Cowles  

## 2011-10-19 NOTE — Telephone Encounter (Signed)
Changed to pens and faxed back to [facility] pharmacy

## 2011-10-28 DIAGNOSIS — Z309 Encounter for contraceptive management, unspecified: Secondary | ICD-10-CM | POA: Insufficient documentation

## 2011-10-28 NOTE — Assessment & Plan Note (Signed)
>>  ASSESSMENT AND PLAN FOR DM (DIABETES MELLITUS), TYPE 2, UNCONTROLLED WRITTEN ON 10/28/2011  9:38 PM BY MATTHEWS, CODY  Difficulty obtaining medications contributing to non-compliance.  Now enrolled in MAP program.  Will have health coach continue to follow up to see is she is continuing to be compliant once she receives her meds.

## 2011-10-28 NOTE — Assessment & Plan Note (Signed)
Difficulty obtaining medications contributing to non-compliance.  Now enrolled in MAP program.  Will have health coach continue to follow up to see is she is continuing to be compliant once she receives her meds.

## 2011-10-28 NOTE — Assessment & Plan Note (Signed)
Patient with desire to get pregnant in the future after her diabetes is under better control and she has lost weight.  Discussed contraception options available to her. Was on OCP prior, does not wish to be on this currently.  Does not want injectable either. She elects to use condoms at this time.  Warned against getting pregnant while on ARB, states she will use condom each time during intercourse.  Given bag of condoms prior to leaving office today.

## 2011-10-31 ENCOUNTER — Telehealth: Payer: Self-pay | Admitting: Family Medicine

## 2011-10-31 NOTE — Telephone Encounter (Signed)
Called [facility] pharmacy.  Patient is waiting for order for Lantus & Novolog.  They have samples of Lantus she can pick up and will check on Novolog samples.  Called patient and informed her that she can pick up Lantus samples at [facility] pharmacy.  She will call our office back for Novolog samples if they do not have any.Gaylene Brooks, RN

## 2011-10-31 NOTE — Telephone Encounter (Signed)
Is having a hard time getting her insulin from MAP - she is now out and wants to know if we have any samples to last until they can get some for her.

## 2011-11-02 ENCOUNTER — Telehealth: Payer: Self-pay | Admitting: Home Health Services

## 2011-11-02 NOTE — Telephone Encounter (Signed)
Spoke with [facility].   Pt reports being able to pick up insulin yesterday and just started taking it again.  Pt's fasting glucose today was 300.  Informed pt that if it stays above 300 to contact us.  Pt reports walking 5 times this week in park near her home.  Pt's overall goal is weight loss, DM management, and future pregnancy.

## 2011-11-16 ENCOUNTER — Telehealth: Payer: Self-pay | Admitting: Family Medicine

## 2011-11-16 NOTE — Telephone Encounter (Signed)
Please call patient back regarding her meds.  She is out of her diabetes meds and have been trying to get in touch with the MAP office.  Please contact patient to help her with this

## 2011-11-16 NOTE — Telephone Encounter (Signed)
Returned call to patient.  She is out of Novolog, Lantus, metformin, and Victoza.  Has not been able to get in touch with MAP program.  Informed patient I will call MAP program and call her back.  Called MAP program and spoke with Rosey Bath.  Need refill approval for metformin and Victoza.  Approval given to refill metformin and Victoza this time with one additional refill.  They will give her samples of Novolog and Lantus.  Patient will need to pay $6 for metformin and they don't have samples of the Victoza.  They will call patient later today to let her know when she can come pick up meds.  Returned call to patient and informed that MAP program will call her later today when her samples are ready to be picked up.  Gaylene Brooks, RN

## 2011-11-19 ENCOUNTER — Ambulatory Visit (INDEPENDENT_AMBULATORY_CARE_PROVIDER_SITE_OTHER): Payer: Self-pay | Admitting: *Deleted

## 2011-11-19 DIAGNOSIS — Z111 Encounter for screening for respiratory tuberculosis: Secondary | ICD-10-CM

## 2011-11-19 NOTE — Telephone Encounter (Signed)
Patient in office for PPD and advised her to go by [facility]pt pharmacy for her samples.

## 2011-11-19 NOTE — Telephone Encounter (Signed)
Called MAP and they do have samples of victoza, lantus and metformin ready to pick up. They will have to check about samples of Novolog as they did not have this ready for her.   Tried to call patient , left message to call back.

## 2011-11-19 NOTE — Telephone Encounter (Signed)
Pt never got a call from MAP and is now out of her meds - wants to know if we can call MAP to see what she can do.

## 2011-11-21 ENCOUNTER — Ambulatory Visit (INDEPENDENT_AMBULATORY_CARE_PROVIDER_SITE_OTHER): Payer: Self-pay | Admitting: *Deleted

## 2011-11-21 DIAGNOSIS — Z111 Encounter for screening for respiratory tuberculosis: Secondary | ICD-10-CM

## 2011-11-21 DIAGNOSIS — IMO0001 Reserved for inherently not codable concepts without codable children: Secondary | ICD-10-CM

## 2011-11-21 LAB — TB SKIN TEST
Induration: 0
TB Skin Test: NEGATIVE mm

## 2011-12-07 ENCOUNTER — Telehealth: Payer: Self-pay | Admitting: Home Health Services

## 2011-12-07 NOTE — Telephone Encounter (Signed)
Spoke with [facility].  Pt not feeling well. Grandmother died last night.   Pt reports taking medications as prescribed but is about to run out.  Encouraged her to contact health department ASAP to get refills.  Pt reports blood sugars running over 300.  One day it was 230.  Pt will call them today.  Pt has not been walking recently because she has been taking care of her uncle.    Pt's goal this week is to start walking again, continue self-monitoring of blood sugar, and to get refills on meds.  Pt's overall goal is weight loss/DM management.

## 2012-01-21 ENCOUNTER — Other Ambulatory Visit: Payer: Self-pay | Admitting: Family Medicine

## 2012-01-21 MED ORDER — OLMESARTAN MEDOXOMIL-HCTZ 40-25 MG PO TABS: 1.0000 | ORAL_TABLET | Freq: Every day | ORAL | Status: DC

## 2012-02-06 ENCOUNTER — Other Ambulatory Visit: Payer: Self-pay | Admitting: Family Medicine

## 2012-02-06 MED ORDER — INSULIN GLARGINE 100 UNIT/ML ~~LOC~~ SOLN: SUBCUTANEOUS | Status: DC

## 2012-02-06 NOTE — Telephone Encounter (Signed)
Will send in refill to [facility].  Patient reports she is currently on 108U but Rx has 75U and she needs new Rx.  Will put sig as "use as prescribed" and PCP can make final decision on total amount of lantus pt should be getting at her next office visit.

## 2012-02-11 ENCOUNTER — Telehealth: Payer: Self-pay | Admitting: Family Medicine

## 2012-02-11 ENCOUNTER — Telehealth: Payer: Self-pay | Admitting: *Deleted

## 2012-02-11 MED ORDER — METFORMIN HCL 1000 MG PO TABS: 1000.0000 mg | ORAL_TABLET | Freq: Every day | ORAL | Status: DC

## 2012-02-11 MED ORDER — INSULIN GLARGINE 100 UNIT/ML ~~LOC~~ SOLN: 108.0000 [IU] | Freq: Every day | SUBCUTANEOUS | Status: DC

## 2012-02-11 NOTE — Telephone Encounter (Signed)
Rx called to pharmacy as directed for lantus insulin. Appointment scheduled with Dr. Ashley Royalty.

## 2012-02-11 NOTE — Telephone Encounter (Signed)
Ok to call in for lantus 108 units qhs dispense 5 pens, 0 refills.. She needs appt with me asap to continue getting refills and to f/u her diabetes

## 2012-02-11 NOTE — Telephone Encounter (Signed)
Dr. Fara Boros sent in refill request for Lantus.  See previous note. Patient had stated to RN that she is up to 108 units of the Lantus daily.  Dr. Erich Montane had wrote in use "as directed ". Need Dr. Ashley Royalty to verify and do new order with correct dosage. Pharmacy will not fill with "as directed order " patient had stated she had enough insulin to last until Monday.

## 2012-02-11 NOTE — Telephone Encounter (Signed)
Rx sent to [facility] at corner of [address].

## 2012-02-11 NOTE — Telephone Encounter (Signed)
Ms. Krista Adams need a new rx written for her Metformin to take to [facility] because she can get it cheaper than what she's paying the [facility] pharmacy.  Please fax to [facility] on [address], or print out rx and have patient pick up.

## 2012-02-27 ENCOUNTER — Encounter: Payer: Self-pay | Admitting: Family Medicine

## 2012-02-27 ENCOUNTER — Ambulatory Visit (INDEPENDENT_AMBULATORY_CARE_PROVIDER_SITE_OTHER): Payer: Self-pay | Admitting: Family Medicine

## 2012-02-27 VITALS — BP 118/82 | HR 78 | Temp 98.2°F | Ht 64.0 in | Wt 353.0 lb

## 2012-02-27 DIAGNOSIS — I10 Essential (primary) hypertension: Secondary | ICD-10-CM

## 2012-02-27 DIAGNOSIS — IMO0001 Reserved for inherently not codable concepts without codable children: Secondary | ICD-10-CM

## 2012-02-27 DIAGNOSIS — N926 Irregular menstruation, unspecified: Secondary | ICD-10-CM

## 2012-02-27 DIAGNOSIS — N939 Abnormal uterine and vaginal bleeding, unspecified: Secondary | ICD-10-CM

## 2012-02-27 LAB — GLUCOSE, CAPILLARY: Glucose-Capillary: 198 mg/dL — ABNORMAL HIGH (ref 70–99)

## 2012-02-27 LAB — POCT GLYCOSYLATED HEMOGLOBIN (HGB A1C): Hemoglobin A1C: 9.6

## 2012-02-27 LAB — TSH: TSH: 2.045 u[IU]/mL (ref 0.350–4.500)

## 2012-02-27 MED ORDER — METFORMIN HCL 1000 MG PO TABS: 1000.0000 mg | ORAL_TABLET | Freq: Every day | ORAL | Status: DC

## 2012-02-27 MED ORDER — AMLODIPINE BESYLATE 5 MG PO TABS: 5.0000 mg | ORAL_TABLET | Freq: Every day | ORAL | Status: DC

## 2012-02-27 MED ORDER — HYDROCHLOROTHIAZIDE 25 MG PO TABS: 25.0000 mg | ORAL_TABLET | Freq: Every day | ORAL | Status: DC

## 2012-02-27 NOTE — Patient Instructions (Addendum)
Thank you for coming in today, it was good to see you I would like for you to stop your victoza I want you to split up your lantus, 60 units in the morning and 60 units in the evening.  If your blood sugar is >200, I would like for you to go up 1 unit in the morning and one unit in the evening. (Example blood sugar 225, take 61 units in the morning and 61 in the evening, if above 200 the next day go up to 62 units in the morning and 62 units in the evening)  For your novolog use the following sliding scale: CBG 70 - 120: 0 units CBG 121 - 150: 3 units CBG 151 - 200: 4 units CBG 201 - 250: 7 units CBG 251 - 300: 11 units CBG 301 - 350: 15 units CBG 351 - 400: 20 units  For your blood pressure I want you to start taking the new medication, I want to see you back in 1 week to recheck your blood pressure on the new medication and to see how your blood sugars are doing.

## 2012-03-07 ENCOUNTER — Other Ambulatory Visit: Payer: Self-pay | Admitting: Family Medicine

## 2012-03-07 ENCOUNTER — Encounter: Payer: Self-pay | Admitting: Family Medicine

## 2012-03-07 MED ORDER — INSULIN GLARGINE 100 UNIT/ML ~~LOC~~ SOLN: 108.0000 [IU] | Freq: Every day | SUBCUTANEOUS | Status: DC

## 2012-03-09 NOTE — Assessment & Plan Note (Signed)
>>  ASSESSMENT AND PLAN FOR DM (DIABETES MELLITUS), TYPE 2, UNCONTROLLED WRITTEN ON 03/09/2012 10:25 PM BY MATTHEWS, CODY  A1c unchanged, still not well controlled.  Having significant vomiting with victoza.  Also has forgotten what her SSI is supposed to be.  Plan to discontinue victoza.  Change to lantus and novolog only.  Given new SSI.  Will have her split lantus up as well, and have her titrate up by 2 units each day that her glucose is over 200.  I strongly advised her against getting pregnant as her diabetes is not well controlled.  I will discontinue her ARB however so no complications arise if she were to get pregnant.

## 2012-03-09 NOTE — Assessment & Plan Note (Addendum)
A1c unchanged, still not well controlled.  Having significant vomiting with victoza.  Also has forgotten what her SSI is supposed to be.  Plan to discontinue victoza.  Change to lantus and novolog only.  Given new SSI.  Will have her split lantus up as well, and have her titrate up by 2 units each day that her glucose is over 200.  I strongly advised her against getting pregnant as her diabetes is not well controlled.  I will discontinue her ARB however so no complications arise if she were to get pregnant.

## 2012-03-09 NOTE — Assessment & Plan Note (Signed)
Well controlled, however patient trying to get pregnant although I have strongly advised against this.  Will change her from ARB to CCB and have her f/u in 1 week for BP recheck.

## 2012-03-09 NOTE — Progress Notes (Signed)
  Subjective:    Patient ID: Krista Adams, female    DOB: 01-Dec-1986, 26 y.o.   MRN: [id number]  HPI Comments: Admits she wishes to have a child and is actively trying to get pregnant.   Knows she is not healthy and menstrual cycles irregular, but she is going to continue to try.   Diabetes She presents for her follow-up diabetic visit. She has type 2 diabetes mellitus. Her disease course has been worsening. There are no hypoglycemic associated symptoms. Pertinent negatives for hypoglycemia include no headaches. Pertinent negatives for diabetes include no blurred vision and no chest pain. Symptoms are stable. Risk factors for coronary artery disease include hypertension. Current diabetic treatments: Currently on insulin, victoza and metformin.  She has nausea and vomting quite often when taking victoza.  She is compliant with treatment most of the time. Her weight is increasing steadily. She is following a generally unhealthy diet. When asked about meal planning, she reported none. She rarely participates in exercise. Home blood sugar record trend: Does not bring log but reports glucose usually around  170's fasting. ACE inhibitor/angiotensin II receptor blocker: Currently taking ARB.  Hypertension This is a chronic problem. Progression since onset: improved. The problem is controlled. Associated symptoms include peripheral edema. Pertinent negatives include no blurred vision, chest pain, headaches, palpitations or shortness of breath. There are no associated agents to hypertension. Past treatments include angiotensin blockers and diuretics. The current treatment provides significant improvement. There are no compliance problems.  No signs of end organ damage.     Review of Systems  Eyes: Negative for blurred vision.  Respiratory: Negative for shortness of breath.   Cardiovascular: Negative for chest pain and palpitations.  Neurological: Negative for headaches.       Objective:   Physical  Exam  Constitutional:       Morbidly obese  Neck: Neck supple. No thyromegaly present.  Cardiovascular: Normal rate, regular rhythm and normal heart sounds.   Pulmonary/Chest: Effort normal and breath sounds normal.  Musculoskeletal: She exhibits no edema.  Psychiatric:       Very tearful when discussing desire to be pregnant.  States she feels like she is getting too old to get pregnant, wanted to be pregnant by no later than 23.            Assessment & Plan:

## 2012-03-10 ENCOUNTER — Telehealth: Payer: Self-pay | Admitting: Family Medicine

## 2012-03-10 NOTE — Telephone Encounter (Signed)
RX called to [facility] for Lantus insulin. They have a current RX on file for Novolog. Notified [facility] to cancel RX for Lantus.  Message left on voicemail of this and samples are ready to pick up. Novolog  one sample   [employer] lot # [id number] exp date 12/2013 )and Lantus one sample   ([employer] lot # [id number] exp date 05/2014 )

## 2012-03-10 NOTE — Telephone Encounter (Signed)
Is having trouble getting her insulin and the insulin was sent to the wrong place. Needs to go to [facility] - she is also out of everything and wants to know if we have any samples for her novalog and lantus until she can get it from the MAP

## 2012-03-11 ENCOUNTER — Telehealth: Payer: Self-pay | Admitting: Family Medicine

## 2012-03-11 NOTE — Telephone Encounter (Signed)
The Rx for Lantus went to [facility] and it needs to go to the MAP program.  Patient is completely out of this medication.

## 2012-03-11 NOTE — Telephone Encounter (Signed)
Lantus Solostar 100 units/ml,  Inject 108 units subcutaneous daily at bedtime.  Dispense 5 pens with 12 [phone].  Advised original RX was sent to [facility] on [address] if they could just transfer RX.  Spoke with Sherol to let her know I had called in her insulin. Krista Adams

## 2012-03-13 ENCOUNTER — Ambulatory Visit (INDEPENDENT_AMBULATORY_CARE_PROVIDER_SITE_OTHER): Payer: Self-pay | Admitting: Family Medicine

## 2012-03-13 ENCOUNTER — Encounter: Payer: Self-pay | Admitting: Family Medicine

## 2012-03-13 VITALS — BP 146/80 | HR 80 | Ht 64.0 in | Wt 358.0 lb

## 2012-03-13 DIAGNOSIS — F341 Dysthymic disorder: Secondary | ICD-10-CM

## 2012-03-13 DIAGNOSIS — F418 Other specified anxiety disorders: Secondary | ICD-10-CM

## 2012-03-13 DIAGNOSIS — IMO0001 Reserved for inherently not codable concepts without codable children: Secondary | ICD-10-CM

## 2012-03-13 DIAGNOSIS — N970 Female infertility associated with anovulation: Secondary | ICD-10-CM

## 2012-03-13 DIAGNOSIS — I10 Essential (primary) hypertension: Secondary | ICD-10-CM

## 2012-03-13 HISTORY — DX: Female infertility associated with anovulation: N97.0

## 2012-03-13 LAB — CBC
HCT: 40.1 % (ref 36.0–46.0)
Hemoglobin: 13 g/dL (ref 12.0–15.0)
MCH: 27.7 pg (ref 26.0–34.0)
MCHC: 32.4 g/dL (ref 30.0–36.0)
MCV: 85.3 fL (ref 78.0–100.0)
Platelets: 316 10*3/uL (ref 150–400)
RBC: 4.7 MIL/uL (ref 3.87–5.11)
RDW: 14.6 % (ref 11.5–15.5)
WBC: 6.5 10*3/uL (ref 4.0–10.5)

## 2012-03-13 LAB — POCT URINE PREGNANCY: Preg Test, Ur: NEGATIVE

## 2012-03-13 MED ORDER — MEDROXYPROGESTERONE ACETATE 10 MG PO TABS: 10.0000 mg | ORAL_TABLET | Freq: Every day | ORAL | Status: DC

## 2012-03-13 MED ORDER — METOPROLOL SUCCINATE ER 25 MG PO TB24: 25.0000 mg | ORAL_TABLET | Freq: Every day | ORAL | Status: DC

## 2012-03-13 MED ORDER — CITALOPRAM HYDROBROMIDE 20 MG PO TABS: 20.0000 mg | ORAL_TABLET | Freq: Every day | ORAL | Status: DC

## 2012-03-13 NOTE — Patient Instructions (Addendum)
Thank you for coming in today, it was good to see you I would like for you to continue to increase your lantus, for each day your fasting sugar is more than 150 I want you to go up one unit in the morning and one unit at night. I want you to take the medication (medroxyprogesterone) for your bleeding, let me know if your bleeding is not slowing or stopping.  I would like for you to follow up in 7-10 days.  Stop taking your amlodipine.  Start taking metoprolol.  I have sent over a medication to the MAP program (citalopram) to see if this helps with your nerves.

## 2012-03-18 DIAGNOSIS — F418 Other specified anxiety disorders: Secondary | ICD-10-CM

## 2012-03-18 HISTORY — DX: Other specified anxiety disorders: F41.8

## 2012-03-18 NOTE — Assessment & Plan Note (Signed)
BP not at goal.  ARB stopped previously due to her trying to get pregnant, despite me continuing to strongly advise against this 2/2 to her other comorbidities.  Does not like side effects of amlodipine, will change to metoprolol and have her back in 7-10 to f/u diabetes, htn and uterine bleeding.

## 2012-03-18 NOTE — Progress Notes (Signed)
  Subjective:    Patient ID: Krista Adams, female    DOB: [birth year], 26 y.o.   MRN: [id number]  HPI 1.  Diabetes:  Has stopped victoza and currently using metformin,  Lantus and novolog.  Has been using novolog per SSI, however has not been titrating lantus up as instructed previously.  Still has desire to get pregnant, therefore ARB d/c'ed previously.  Glucose averaging in the 200's, but has seen some in the 170's which were unheard of before changing insulin regimen.  She states she is feeling a little better with the lower glucose.  Trying to make lifestyle changes as well.    2. Heavy menses:  Has had constant bleeding over the past 3 weeks.  Using multiple pads per day, unable to give an exact number.  Also passing clots.  Does not seem to be slowing any.  Has history of irregular menses, can go months without having menstrual period.  Typically long when she does have a period.  Not interested in long term OCP.  Denies dizziness.  3. HTN:  ARB stopped at previous appt due to patient desire to be pregnant.  Amlodipine started, however she has had significant lower extremity swelling since addition of this medication.  Wanting to change to a different medication.  BP still not well controlled.  Denies headache, palpitations, weakness, nausea or vomiting, chest pain.  3. Anxiety:  Has had increasing "nerve problems" over the past year.  States she gets frustrated easily and feels bad about herself quite often.  Endorses some anhedonia and sleep difficulties.  Denies any thoughts of SI or HI.    Review of Systems     Objective:   Physical Exam  Constitutional: She is oriented to person, place, and time.       Obese, nad   Cardiovascular: Normal rate and regular rhythm.   Pulmonary/Chest: Breath sounds normal.  Abdominal: Soft. There is no tenderness.  Musculoskeletal: She exhibits edema.       1+  Neurological: She is alert and oriented to person, place, and time.  Psychiatric: Her  behavior is normal. Judgment and thought content normal.       Tearful at times when talking about desire to be pregnant and feelings of anxiety and depression           Assessment & Plan:

## 2012-03-18 NOTE — Assessment & Plan Note (Signed)
PHQ-9 with score of 14, no SI or HI.  I think she would benefit from a tral of SSRI to see if this helps to improve her symptoms.

## 2012-03-18 NOTE — Assessment & Plan Note (Addendum)
UPT negative.  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  Seems to be a long standing problem.  Will give provera short term, check CBC.  Will likely need endometrial biopsy to rule out other pathological causes of prolonged bleeding, once this episode has resolved.

## 2012-03-18 NOTE — Assessment & Plan Note (Addendum)
Control somewhat better.  Instructed to continue SS for novolog, and titrate lantus up as directed in patient instructions.

## 2012-03-18 NOTE — Assessment & Plan Note (Signed)
>>  ASSESSMENT AND PLAN FOR DM (DIABETES MELLITUS), TYPE 2, UNCONTROLLED WRITTEN ON 03/18/2012  9:24 PM BY MATTHEWS, CODY  Control somewhat better.  Instructed to continue SS for novolog, and titrate lantus up as directed in patient instructions.

## 2012-03-21 ENCOUNTER — Ambulatory Visit: Payer: Self-pay | Admitting: Family Medicine

## 2012-03-28 ENCOUNTER — Other Ambulatory Visit: Payer: Self-pay | Admitting: Family Medicine

## 2012-03-28 ENCOUNTER — Telehealth: Payer: Self-pay | Admitting: Family Medicine

## 2012-03-28 DIAGNOSIS — N970 Female infertility associated with anovulation: Secondary | ICD-10-CM

## 2012-03-28 MED ORDER — CITALOPRAM HYDROBROMIDE 20 MG PO TABS: 20.0000 mg | ORAL_TABLET | Freq: Every day | ORAL | Status: DC

## 2012-03-28 MED ORDER — METOPROLOL SUCCINATE ER 25 MG PO TB24: 25.0000 mg | ORAL_TABLET | Freq: Every day | ORAL | Status: DC

## 2012-03-28 NOTE — Telephone Encounter (Signed)
Fwd. To PCP for refills 

## 2012-03-28 NOTE — Telephone Encounter (Signed)
The [facility] did not receive the Rx's for Celexa or Toprol XL.  Can these please be reprinted and faxed.  She was supposed to come in for a BP check but cannot do that until she has been on the medication.

## 2012-03-31 NOTE — Telephone Encounter (Signed)
Will route to Dr. Molli Hazard to refill meds to [facility] pharmacy.  Gaylene Brooks, RN

## 2012-03-31 NOTE — Telephone Encounter (Signed)
RX were filled on 03/28/2012 called MAP and gave rx verbally to pharmacist. Patient notified.

## 2012-03-31 NOTE — Telephone Encounter (Signed)
Pt is still having problems with getting her meds from [facility] - they say they still don't have the scripts.

## 2012-04-02 ENCOUNTER — Ambulatory Visit: Payer: Self-pay | Admitting: Family Medicine

## 2012-04-03 ENCOUNTER — Telehealth: Payer: Self-pay | Admitting: *Deleted

## 2012-04-14 ENCOUNTER — Other Ambulatory Visit: Payer: Self-pay | Admitting: Family Medicine

## 2012-04-14 MED ORDER — INSULIN GLARGINE 100 UNIT/ML ~~LOC~~ SOLN: SUBCUTANEOUS | Status: DC

## 2012-04-23 NOTE — Telephone Encounter (Signed)
Can use ibuprofen 600mg  over the counter q8hours until seen on Friday.  Per patient instructions and verbal instructions she was supposed to follow up with me 7-10 days after visit on 4/11 to recheck bleeding but never returned.

## 2012-04-23 NOTE — Telephone Encounter (Signed)
Patient is calling because she finished the medication that was prescribed to hopefully slow down her cycle, but it actually boosted it.  Patient is having severe cramping and bleeding so I suggested she come in to be seen today but due to meetings with work, she is not able to come until Friday.  She is scheduled to see Dr. Rivka Safer but is hoping for some pain medication or something so that she can at least find a comfortable position and be able to get some rest.

## 2012-04-24 NOTE — Telephone Encounter (Signed)
Pt advised OK to take ibuprofen per Dr Ashley Royalty and to keep appt tomr. Pt agreed

## 2012-04-25 ENCOUNTER — Ambulatory Visit (INDEPENDENT_AMBULATORY_CARE_PROVIDER_SITE_OTHER): Payer: Self-pay | Admitting: Family Medicine

## 2012-04-25 ENCOUNTER — Encounter: Payer: Self-pay | Admitting: Family Medicine

## 2012-04-25 VITALS — BP 160/94 | HR 66 | Ht 64.0 in | Wt 355.6 lb

## 2012-04-25 DIAGNOSIS — N920 Excessive and frequent menstruation with regular cycle: Secondary | ICD-10-CM | POA: Insufficient documentation

## 2012-04-25 LAB — POCT HEMOGLOBIN: Hemoglobin: 11.7 g/dL — AB (ref 12.2–16.2)

## 2012-04-25 NOTE — Progress Notes (Signed)
  Subjective:   Patient ID: Krista Adams, female DOB: Apr 26, 1986 26 y.o. MRN: [id number] HPI:  1. Painful menstrual cramping and heavy period.  Synopsis: started one week ago, heavy cramps, never been this painful. Normally starts in lower back, now in lower abdomen. 10/10 pain.  Location: pelvic and abdominal  Onset: has been acute  Time period of: 1 week(s).  Severity is described as moderate to severe.  Aggravating: menses, all positions.  Alleviating: ibuprofen, heat pad.  Associated sx/sn: no fever, cold but no chills, hot spells during the last week.  Normal urination. No vaginal pain and discharge.  Contraception: nothing, has used condoms.   Meenarche: 12 - irregular, not always heavy.   History  Substance Use Topics  . Smoking status: Never Smoker   . Smokeless tobacco: Not on file  . Alcohol Use: No    Review of Systems: Pertinent items are noted in HPI.  Labs Reviewed: 4/11 normal h/h.  Lab Results  Component Value Date   HGB 11.7* 04/25/2012   Reviewed Chart Review for last notes.     Objective:   Filed Vitals:   04/25/12 0956  BP: 160/94  Pulse: 66  Height: 5\' 4"  (1.626 m)  Weight: 355 lb 9.6 oz (161.299 kg)  Body mass index is 61.04 kg/(m^2). Physical Exam: General: morbidly obese aaf, pleasant Abdomen: soft and non-tender without masses, organomegaly or hernias noted.  No guarding or rebound Lungs:  Normal respiratory effort, chest expands symmetrically. Lungs are clear to auscultation, no crackles or wheezes. Heart - Regular rate and rhythm.  No murmurs, gallops or rubs.    Extremities:   Non-tender, No cyanosis, edema, or deformity noted. Vaginal exam: deferred.  Assessment & Plan:

## 2012-04-25 NOTE — Assessment & Plan Note (Signed)
Patient has one week of heavy bleeding and 10/10 cramping. She says she has never had this bad of a period before. Its getting better today without cramps, and slowing of bleeding. Lab Results  Component Value Date   HGB 11.7* 04/25/2012  no profound anemia. Advised to wait this one out and if it happens again, could consider birth control pills to stop menses.

## 2012-04-25 NOTE — Patient Instructions (Signed)
It was great to see you today!  Schedule an appointment to see me as needed.  You have a heavy period with more pain than usual.   Im glad it is starting to resolve.  If you continue to have heavy painful periods we may have to treat you with birth control pills.  I will check your blood count today to make sure you aren't bleeding too much.

## 2012-05-06 ENCOUNTER — Ambulatory Visit (INDEPENDENT_AMBULATORY_CARE_PROVIDER_SITE_OTHER): Payer: Self-pay | Admitting: Family Medicine

## 2012-05-06 ENCOUNTER — Encounter: Payer: Self-pay | Admitting: Family Medicine

## 2012-05-06 VITALS — BP 156/88 | HR 61 | Temp 97.7°F | Ht 64.0 in | Wt 357.5 lb

## 2012-05-06 DIAGNOSIS — IMO0001 Reserved for inherently not codable concepts without codable children: Secondary | ICD-10-CM

## 2012-05-06 DIAGNOSIS — F341 Dysthymic disorder: Secondary | ICD-10-CM

## 2012-05-06 DIAGNOSIS — N970 Female infertility associated with anovulation: Secondary | ICD-10-CM

## 2012-05-06 DIAGNOSIS — F418 Other specified anxiety disorders: Secondary | ICD-10-CM

## 2012-05-06 DIAGNOSIS — I10 Essential (primary) hypertension: Secondary | ICD-10-CM

## 2012-05-06 MED ORDER — INSULIN GLARGINE 100 UNIT/ML ~~LOC~~ SOLN: SUBCUTANEOUS | Status: DC

## 2012-05-06 MED ORDER — METOPROLOL SUCCINATE ER 25 MG PO TB24: 25.0000 mg | ORAL_TABLET | Freq: Every day | ORAL | Status: DC

## 2012-05-06 MED ORDER — INSULIN ASPART 100 UNIT/ML ~~LOC~~ SOLN: SUBCUTANEOUS | Status: DC

## 2012-05-06 MED ORDER — CITALOPRAM HYDROBROMIDE 40 MG PO TABS: 40.0000 mg | ORAL_TABLET | Freq: Every day | ORAL | Status: DC

## 2012-05-06 NOTE — Patient Instructions (Signed)
I want you to split up your lantus, 60 units in the morning and 60 units in the evening. If your blood sugar is >200, I would like for you to go up 1 unit in the morning and one unit in the evening. (Example blood sugar 225, take 61 units in the morning and 61 in the evening, if above 200 the next day go up to 62 units in the morning and 62 units in the evening)   For your novolog use the following sliding scale with each meal: CBG 70 - 120: 0 units CBG 121 - 150: 3 units CBG 151 - 200: 4 units CBG 201 - 250: 7 units CBG 251 - 300: 11 units CBG 301 - 350: 15 units CBG 351 - 400: 20 units  I will see you back in one month.

## 2012-05-08 NOTE — Assessment & Plan Note (Signed)
Increase citalopram to 40 mg follow to see if this has helped in one month.

## 2012-05-08 NOTE — Assessment & Plan Note (Signed)
>>  ASSESSMENT AND PLAN FOR DM (DIABETES MELLITUS), TYPE 2, UNCONTROLLED WRITTEN ON 05/08/2012 10:31 PM BY MATTHEWS, CODY   Spent a significant amount of time reviewing her sliding scale insulin and Lantus administration schedule. Went over the differences between the 2 insulins again. She is going to try to titrate her weight is up as directed period all have her followup in one month to see how she's doing. Instructed to keep a log of her blood sugars.

## 2012-05-08 NOTE — Assessment & Plan Note (Signed)
Blood pressure elevated today however she is not taking her metoprolol right now. We'll follow blood pressure in one month.

## 2012-05-08 NOTE — Assessment & Plan Note (Signed)
Currently no exercise or dietary changes. Her weight is up 2 pounds in the past month. Discuss trying to walk and dietary changes to help with weight loss.

## 2012-05-08 NOTE — Assessment & Plan Note (Addendum)
Spent a significant amount of time reviewing her sliding scale insulin and Lantus administration schedule. Went over the differences between the 2 insulins again. She is going to try to titrate her weight is up as directed period all have her followup in one month to see how she's doing. Instructed to keep a log of her blood sugars.

## 2012-05-08 NOTE — Progress Notes (Signed)
  Subjective:    Patient ID: Krista Adams, female    DOB: 05-Sep-1986, 26 y.o.   MRN: [id number]  HPI  1. Diabetes: Diabetes has been poorly controlled in the past. She has had difficulty obtaining her medications over the past few weeks and the [facility]. She is also a unclear on her insulin administration at this time. She has only been using NovoLog once per day. She has been using her Lantus twice per day. She reports that her blood glucoses usually in the low to mid 200s fasting. Sometimes getting over 300 after meals. She denies any feelings of increased thirst or urination. Denies chest pain or nausea.  2. Hypertension: Blood pressure control has not been optimal in the past. As above she has had trouble obtaining medications from the  [facility] and does not have her metoprolol. She does not check her blood pressure at home. She denies palpitations, headache, shortness of breath.  3. Anxiety/depression: Currently taking Celexa 20 mg. Works fairly well but feels like she can have better effect at higher dose. She denies any thoughts of hurting herself or others. Does endorse some episodes of anhedonia and tearfulness.   Review of Systems  Per history of present illness    Objective:   Physical Exam  Constitutional: She is oriented to person, place, and time.       Morbidly obese female, nad   Cardiovascular: Normal rate, regular rhythm and normal heart sounds.   Pulmonary/Chest: Effort normal and breath sounds normal.  Musculoskeletal: She exhibits no edema.  Neurological: She is alert and oriented to person, place, and time.  Psychiatric: She has a normal mood and affect. Her behavior is normal.          Assessment & Plan:

## 2012-06-24 ENCOUNTER — Encounter: Payer: Self-pay | Admitting: Family Medicine

## 2012-06-24 ENCOUNTER — Ambulatory Visit (INDEPENDENT_AMBULATORY_CARE_PROVIDER_SITE_OTHER): Payer: Self-pay | Admitting: Family Medicine

## 2012-06-24 VITALS — BP 166/77 | HR 83 | Temp 98.2°F | Ht 64.0 in | Wt 354.0 lb

## 2012-06-24 DIAGNOSIS — I10 Essential (primary) hypertension: Secondary | ICD-10-CM

## 2012-06-24 DIAGNOSIS — Z111 Encounter for screening for respiratory tuberculosis: Secondary | ICD-10-CM

## 2012-06-24 DIAGNOSIS — G4700 Insomnia, unspecified: Secondary | ICD-10-CM

## 2012-06-24 DIAGNOSIS — N390 Urinary tract infection, site not specified: Secondary | ICD-10-CM

## 2012-06-24 DIAGNOSIS — R350 Frequency of micturition: Secondary | ICD-10-CM

## 2012-06-24 DIAGNOSIS — R300 Dysuria: Secondary | ICD-10-CM

## 2012-06-24 LAB — POCT UA - MICROSCOPIC ONLY

## 2012-06-24 LAB — POCT URINALYSIS DIPSTICK
Glucose, UA: >1000
Leukocytes, UA: NEGATIVE
Spec Grav, UA: 1.01

## 2012-06-24 MED ORDER — CEPHALEXIN 500 MG PO CAPS: 500.0000 mg | ORAL_CAPSULE | Freq: Two times a day (BID) | ORAL | Status: AC

## 2012-06-24 MED ORDER — CEPHALEXIN 500 MG PO CAPS: 500.0000 mg | ORAL_CAPSULE | Freq: Two times a day (BID) | ORAL | Status: DC

## 2012-06-24 NOTE — Patient Instructions (Addendum)
Thank you for coming in today, it was good to see you I am giving you a medication called Keflex for a urinary tract infection Be sure to increase your lantus as directed.  I will see you back in 1-2 months or sooner as needed Try benadryl and sleep hygiene to help with sleep.

## 2012-06-25 ENCOUNTER — Ambulatory Visit: Payer: Self-pay

## 2012-06-27 ENCOUNTER — Ambulatory Visit (INDEPENDENT_AMBULATORY_CARE_PROVIDER_SITE_OTHER): Payer: Self-pay | Admitting: *Deleted

## 2012-06-27 DIAGNOSIS — IMO0001 Reserved for inherently not codable concepts without codable children: Secondary | ICD-10-CM

## 2012-06-27 DIAGNOSIS — Z111 Encounter for screening for respiratory tuberculosis: Secondary | ICD-10-CM

## 2012-06-27 LAB — TB SKIN TEST
Induration: 0 mm
TB Skin Test: NEGATIVE

## 2012-06-29 DIAGNOSIS — G4700 Insomnia, unspecified: Secondary | ICD-10-CM | POA: Insufficient documentation

## 2012-06-29 DIAGNOSIS — N390 Urinary tract infection, site not specified: Secondary | ICD-10-CM | POA: Insufficient documentation

## 2012-06-29 NOTE — Progress Notes (Signed)
  Subjective:    Patient ID: Krista Adams, female    DOB: 1985/12/17, 26 y.o.   MRN: [id number]  HPI  1.? UTI:  Has had a couple days of dysuria, urinary frequency and mild abdominal pain.  She denies vaginal discharge, odor, blood in urine.  She has been using AZO for pain relief, which has helped.  Glucose has still not been under great control.  She denies fever/ chills, flank pain.   2.  HTN:  BP elevated today.  She states that she just took medications about 10 minutes prior to arriving for appt. Today.  She is typically compliant with her medications. She denies chest pain, headache, shortness of breath, palpitations, dizziness or vision changes.   3. Increased drowsiness:  Has had increased daytime sleepiness for a few weeks.  She states she does have trouble sleeping at night.  Last night could not fall asleep until 2am. Has not tried anything to help sleep.  Unsure if she snores a lot at night.  Has been trying to find job but too sleepy to go some days.    Review of Systems Per HPI    Objective:   Physical Exam  Constitutional: She is oriented to person, place, and time.       Morbidly obese female, NAD  Cardiovascular: Normal rate and regular rhythm.   Pulmonary/Chest: Effort normal and breath sounds normal.  Abdominal: There is no tenderness.       Obese   Musculoskeletal: She exhibits no edema.  Neurological: She is alert and oriented to person, place, and time.          Assessment & Plan:

## 2012-06-29 NOTE — Assessment & Plan Note (Signed)
UA does not exactly indicate UTI but unable to interpret fully due to prescence of AZO.  Given symptoms will go ahead and treat empirically.  Did have a large amount of glucose in urine so symptoms could be from glucosuria or possible yeast colonization.

## 2012-06-29 NOTE — Assessment & Plan Note (Signed)
Insomnia leading to daytime drowsiness.  Discussed with her sleep hygiene.  Can use OTC sleep aid such as benadryl if she wants.  She likely has OSA contributing as well. given her body habitus, however can not afford sleep study at this time.

## 2012-06-29 NOTE — Assessment & Plan Note (Signed)
Not well controlled today, however she has just taken her medication prior to arrival for appt. If elevated at next appt will need to escalate therapy.

## 2012-07-11 ENCOUNTER — Other Ambulatory Visit: Payer: Self-pay | Admitting: Family Medicine

## 2012-07-11 MED ORDER — INSULIN ASPART 100 UNIT/ML ~~LOC~~ SOLN: SUBCUTANEOUS | Status: DC

## 2012-08-26 ENCOUNTER — Other Ambulatory Visit: Payer: Self-pay | Admitting: Family Medicine

## 2012-08-26 MED ORDER — INSULIN GLARGINE 100 UNIT/ML ~~LOC~~ SOLN: SUBCUTANEOUS | Status: DC

## 2012-10-13 ENCOUNTER — Encounter: Payer: Self-pay | Admitting: Home Health Services

## 2012-10-14 ENCOUNTER — Encounter: Payer: Self-pay | Admitting: Home Health Services

## 2013-01-19 ENCOUNTER — Telehealth: Payer: Self-pay | Admitting: Family Medicine

## 2013-01-19 NOTE — Telephone Encounter (Signed)
Paperwork from the Division of [employer] to be completed by Ashley Royalty.

## 2013-01-28 NOTE — Telephone Encounter (Signed)
Calling to inquire about paperwork that was brought in on 2/17. Would like to know if paperwork has been completed and faxed.

## 2013-01-29 NOTE — Telephone Encounter (Signed)
Will fwd. To Dr.Matthews. ? Krista Adams

## 2013-02-03 NOTE — Telephone Encounter (Signed)
Left message for patient to return call. Paperwork is at front ready for her to pick up. She needs to have her eye doctor complete the rest, see message from Dr Ashley Royalty.Kemari Narez, Rodena Medin

## 2013-02-03 NOTE — Telephone Encounter (Signed)
Paperwork completed.   She needs to have her eye doctor complete section 3 of the form since she is diabetic and wears glasses. She also needs a follow up appointment, last seen 06/2012.

## 2013-02-05 ENCOUNTER — Telehealth: Payer: Self-pay | Admitting: Family Medicine

## 2013-02-05 NOTE — Telephone Encounter (Signed)
Pt called to say she is out of her insulin and has no money to buy any right now.  Wants to know if we have any samples

## 2013-02-05 NOTE — Telephone Encounter (Addendum)
Phone number is incorrect.  Attempted call back twice and reached another person. Will await call back.

## 2013-02-05 NOTE — Telephone Encounter (Signed)
Pt returned call - phn number corrected

## 2013-02-05 NOTE — Telephone Encounter (Signed)
Spoke with patient and she has contacted [facility] pharmacy and is waiting for call back.. She is out of insulin and requests samples. Advised can give limited supply. Will give 2 solostar pens  and one Novolog. Advised she can come by to pick up now.  She plans  to continue getting from [facility].  Samples of Lantus insulin solostar were given to the patient, quantity 2 pens , [id number] exp date 05/2015 and Novolog lot # [id number] exp date 12/2014.    Advised patient that Dr. Ashley Royalty  advises to schedule appointment as soon as possible for follow up.Marland Kitchen

## 2013-02-05 NOTE — Telephone Encounter (Signed)
Patient picked up samples. Gave her Barbara's phone number  to call to schedule appointment to renew [employer] card.

## 2013-02-16 ENCOUNTER — Encounter: Payer: Self-pay | Admitting: Family Medicine

## 2013-02-16 NOTE — Telephone Encounter (Signed)
Error

## 2013-02-18 ENCOUNTER — Encounter: Payer: Self-pay | Admitting: *Deleted

## 2013-03-17 ENCOUNTER — Ambulatory Visit (INDEPENDENT_AMBULATORY_CARE_PROVIDER_SITE_OTHER): Payer: No Typology Code available for payment source | Admitting: *Deleted

## 2013-03-17 DIAGNOSIS — Z111 Encounter for screening for respiratory tuberculosis: Secondary | ICD-10-CM

## 2013-03-17 NOTE — Progress Notes (Signed)
Patient here today for nurse visit for PPD for employer.  Placed PPD and patient will return on 03/19/13 after 4:15 pm to have site read.  Patient states she thought appt was scheduled for her to have diabetes follow-up with PCP.  Patient will check her work schedule and call back to make appt with PCP.    Gaylene Brooks, RN

## 2013-03-19 ENCOUNTER — Encounter: Payer: Self-pay | Admitting: Family Medicine

## 2013-03-19 ENCOUNTER — Ambulatory Visit (INDEPENDENT_AMBULATORY_CARE_PROVIDER_SITE_OTHER): Payer: No Typology Code available for payment source | Admitting: Family Medicine

## 2013-03-19 ENCOUNTER — Encounter: Payer: Self-pay | Admitting: *Deleted

## 2013-03-19 VITALS — BP 166/83 | HR 67 | Ht 64.0 in | Wt 348.0 lb

## 2013-03-19 DIAGNOSIS — E1165 Type 2 diabetes mellitus with hyperglycemia: Secondary | ICD-10-CM

## 2013-03-19 DIAGNOSIS — I10 Essential (primary) hypertension: Secondary | ICD-10-CM

## 2013-03-19 DIAGNOSIS — N926 Irregular menstruation, unspecified: Secondary | ICD-10-CM

## 2013-03-19 DIAGNOSIS — Z3009 Encounter for other general counseling and advice on contraception: Secondary | ICD-10-CM

## 2013-03-19 DIAGNOSIS — IMO0001 Reserved for inherently not codable concepts without codable children: Secondary | ICD-10-CM

## 2013-03-19 LAB — CBC
HCT: 40.6 % (ref 36.0–46.0)
Hemoglobin: 13.3 g/dL (ref 12.0–15.0)
MCH: 27.3 pg (ref 26.0–34.0)
MCHC: 32.8 g/dL (ref 30.0–36.0)
MCV: 83.4 fL (ref 78.0–100.0)
Platelets: 326 10*3/uL (ref 150–400)
RBC: 4.87 MIL/uL (ref 3.87–5.11)
RDW: 14.4 % (ref 11.5–15.5)
WBC: 7.2 10*3/uL (ref 4.0–10.5)

## 2013-03-19 LAB — COMPREHENSIVE METABOLIC PANEL
ALT: <8 U/L (ref 0–35)
AST: 14 U/L (ref 0–37)
Albumin: 4.1 g/dL (ref 3.5–5.2)
Alkaline Phosphatase: 103 U/L (ref 39–117)
BUN: 9 mg/dL (ref 6–23)
CO2: 25 mEq/L (ref 19–32)
Calcium: 9.4 mg/dL (ref 8.4–10.5)
Chloride: 105 mEq/L (ref 96–112)
Creat: 0.58 mg/dL (ref 0.50–1.10)
Glucose, Bld: 88 mg/dL (ref 70–99)
Potassium: 3.7 mEq/L (ref 3.5–5.3)
Sodium: 139 mEq/L (ref 135–145)
Total Bilirubin: 0.6 mg/dL (ref 0.3–1.2)
Total Protein: 7.2 g/dL (ref 6.0–8.3)

## 2013-03-19 LAB — TB SKIN TEST
Induration: 0 mm
TB Skin Test: NEGATIVE

## 2013-03-19 LAB — LDL CHOLESTEROL, DIRECT: Direct LDL: 113 mg/dL — ABNORMAL HIGH

## 2013-03-19 LAB — GLUCOSE, CAPILLARY: Glucose-Capillary: 91 mg/dL (ref 70–99)

## 2013-03-19 LAB — POCT GLYCOSYLATED HEMOGLOBIN (HGB A1C): Hemoglobin A1C: 10

## 2013-03-19 MED ORDER — METFORMIN HCL 500 MG PO TABS: 500.0000 mg | ORAL_TABLET | Freq: Two times a day (BID) | ORAL | Status: DC

## 2013-03-19 MED ORDER — GABAPENTIN 300 MG PO CAPS: 300.0000 mg | ORAL_CAPSULE | Freq: Every day | ORAL | Status: DC

## 2013-03-19 MED ORDER — LISINOPRIL 10 MG PO TABS: 10.0000 mg | ORAL_TABLET | Freq: Every day | ORAL | Status: DC

## 2013-03-19 NOTE — Patient Instructions (Addendum)
General Instructions: I am going to restart your lisinopril for your blood pressure You should use a method of birth control to prevent pregnancy while using this medication I don't think you are healthy enough for pregnancy currently Increase your lantus to 70 units twice per day and follow up with me in one month.  Bring your log of sugars with you.    Treatment Goals:  Goals (1 Years of Data) as of 03/19/13         As of Today 06/24/12 05/06/12 04/25/12 03/13/12     Blood Pressure    . Blood Pressure < 130/80  166/83 166/77 156/88 160/94 146/80     Result Component    . HEMOGLOBIN A1C < 8.0  10.0          Progress Toward Treatment Goals:  Treatment Goal 03/19/2013  Hemoglobin A1C deteriorated  Blood pressure deteriorated    Self Care Goals & Plans:  Self Care Goal 03/19/2013  Manage my medications take my medicines as prescribed; bring my medications to every visit; refill my medications on time  Monitor my health keep track of my blood glucose; bring my glucose meter and log to each visit; keep track of my weight  Eat healthy foods drink diet soda or water instead of juice or soda; eat more vegetables; eat foods that are low in salt; eat baked foods instead of fried foods; eat fruit for snacks and desserts  Be physically active find an activity I enjoy; join a walking program; take a walk every day    Home Blood Glucose Monitoring 03/19/2013  Check my blood sugar 4 times a day  When to check my blood sugar before meals; at bedtime     Care Management & Community Referrals:  Referral 03/19/2013  Referrals made for care management support health educator

## 2013-03-29 ENCOUNTER — Encounter: Payer: Self-pay | Admitting: Family Medicine

## 2013-03-29 DIAGNOSIS — Z3009 Encounter for other general counseling and advice on contraception: Secondary | ICD-10-CM | POA: Insufficient documentation

## 2013-03-29 NOTE — Assessment & Plan Note (Signed)
Had long discussion with her regarding her health and pregnancy.  I discussed that I don't think she is healthy enough for a pregnancy at this time given her uncontrolled diabetes and HTN.  She states that she will plan to use contraception since we are restarting lisionpril.  Declines prescription for contraception.

## 2013-03-29 NOTE — Assessment & Plan Note (Signed)
>>  ASSESSMENT AND PLAN FOR DM (DIABETES MELLITUS), TYPE 2, UNCONTROLLED WRITTEN ON 03/29/2013 10:28 PM BY MATTHEWS, CODY  Continues to be poorly controlled, question her compliance to medication.  She admits she is not compliant to her diet and exercise regimen.   COntinue to increase lantus, may need to switch her to a fixed dose of novolog instead of sliding scale.   Will follow up in one month, instructed to bring log of sugars.

## 2013-03-29 NOTE — Progress Notes (Signed)
  Subjective:    Patient ID: Krista Adams, female    DOB: 1986-09-12, 27 y.o.   MRN: [id number]  Diabetes   1. CHRONIC DIABETES  Disease Monitoring  Blood Sugar Ranges: 250's, typically does not check however  Polyuria: No   Visual problems: no   Medication Compliance: yes, but does miss doses occasionally  Medication Side Effects  Hypoglycemia: no   Preventitive Health Care  Eye Exam: Due, plans to have upcoming  Diet pattern: Generally poor, erratic eating  Exercise: Minimal    2. CHRONIC HYPERTENSION  Disease Monitoring  Blood pressure range: Does not monitor at home  Chest pain: no   Dyspnea: no   Claudication: no   Medication compliance: yes  Medication Side Effects  Lightheadedness: no   Urinary frequency: no   Edema: yes    Preventitive Healthcare:  Salt Restriction: No  3. Family Planning:  Patient reports that she has wanted to get pregnant for some time.  She has been unsuccessful over the past few years.  Stopped lisinopril last year as she was planning on continuing to get pregnant.  She is willing to restart lisinopril given continued HTN and diabetes  Past Medical History  Diagnosis Date  . Essential hypertension, benign 05/06/2008    Qualifier: Diagnosis of  By: Darrol Angel MD, Harrold Donath    . Morbid obesity 05/06/2008    Qualifier: Diagnosis of  By: Darrol Angel MD, Harrold Donath    . DM (diabetes mellitus), type 2, uncontrolled 05/06/2008    Qualifier: Diagnosis of  By: Lanier Prude  MD, Cathrine Muster    . Anovulatory (dysfunctional uterine) bleeding 03/13/2012  . Depression with anxiety 03/18/2012     Review of Systems Per HPI    Objective:   Physical Exam  Constitutional:  Morbidly obese, nad   Cardiovascular: Normal rate and regular rhythm.   Pulmonary/Chest: Effort normal and breath sounds normal.  Musculoskeletal: She exhibits edema (trace).  Neurological: She is alert.          Assessment & Plan:

## 2013-03-29 NOTE — Assessment & Plan Note (Addendum)
Continues to be poorly controlled, question her compliance to medication.  She admits she is not compliant to her diet and exercise regimen.   COntinue to increase lantus, may need to switch her to a fixed dose of novolog instead of sliding scale.   Will follow up in one month, instructed to bring log of sugars.

## 2013-03-29 NOTE — Assessment & Plan Note (Addendum)
Poorly controlled.  Continue metoprolol.  Discussed restarting lisinopril given continued elevated blood pressure and diabetes.  She is willing to restart this .  Discussed avoiding pregnancy while taking this medication.  Follow up in one month, check Cr.

## 2013-04-03 ENCOUNTER — Encounter: Payer: Self-pay | Admitting: Family Medicine

## 2013-04-17 ENCOUNTER — Ambulatory Visit: Payer: No Typology Code available for payment source | Admitting: Family Medicine

## 2013-04-24 ENCOUNTER — Ambulatory Visit: Payer: No Typology Code available for payment source | Admitting: Family Medicine

## 2013-05-04 ENCOUNTER — Other Ambulatory Visit: Payer: Self-pay | Admitting: *Deleted

## 2013-05-04 ENCOUNTER — Other Ambulatory Visit: Payer: Self-pay | Admitting: Family Medicine

## 2013-05-04 MED ORDER — METOPROLOL SUCCINATE ER 25 MG PO TB24: 25.0000 mg | ORAL_TABLET | Freq: Every day | ORAL | Status: DC

## 2013-05-04 NOTE — Telephone Encounter (Signed)
Requested Prescriptions   Pending Prescriptions Disp Refills  . metoprolol succinate (TOPROL-XL) 25 MG 24 hr tablet 90 tablet 2    Sig: Take 1 tablet (25 mg total) by mouth daily. To replace amlodipine   [facility]. Wyatt Haste, RN-BSN

## 2013-06-11 ENCOUNTER — Other Ambulatory Visit: Payer: Self-pay

## 2013-06-23 ENCOUNTER — Encounter: Payer: Self-pay | Admitting: Family Medicine

## 2013-06-23 ENCOUNTER — Ambulatory Visit (INDEPENDENT_AMBULATORY_CARE_PROVIDER_SITE_OTHER): Payer: No Typology Code available for payment source | Admitting: Family Medicine

## 2013-06-23 ENCOUNTER — Ambulatory Visit
Admission: RE | Admit: 2013-06-23 | Discharge: 2013-06-23 | Disposition: A | Discharge status: Home / Self Care | Payer: No Typology Code available for payment source | Source: Ambulatory Visit | Attending: Family Medicine | Admitting: Family Medicine

## 2013-06-23 ENCOUNTER — Telehealth: Payer: Self-pay | Admitting: *Deleted

## 2013-06-23 VITALS — BP 161/98 | HR 66 | Temp 98.7°F | Wt 354.0 lb

## 2013-06-23 DIAGNOSIS — M25552 Pain in left hip: Secondary | ICD-10-CM

## 2013-06-23 DIAGNOSIS — M25559 Pain in unspecified hip: Secondary | ICD-10-CM

## 2013-06-23 MED ORDER — OXYCODONE-ACETAMINOPHEN 5-325 MG PO TABS: 1.0000 | ORAL_TABLET | Freq: Three times a day (TID) | ORAL | Status: DC | PRN

## 2013-06-23 MED ORDER — MORPHINE SULFATE 10 MG/ML IJ SOLN
4.0000 mg | Freq: Once | INTRAMUSCULAR | Status: AC
  Administered 2013-06-23: 4 mg via INTRAMUSCULAR

## 2013-06-23 NOTE — Assessment & Plan Note (Addendum)
Unsure of etiology bust most likely MSK Concerning for fracture vs strain. Low likelyhood of DVT but on differential L hip film.  (NO ACUTE PROCESS NOTED, MINIMAL HIP ARTHRITIS) Percocet x15  Massage and heat therapy.  Rest.

## 2013-06-23 NOTE — Progress Notes (Signed)
Krista Adams is a 27 y.o. female who presents to [facility] today for SD appt for leg pain  L leg pain. Started yesterday. Entire thigh from knee to hip joint. Painful to the touch. Swollen. No redness. Denies any trauma to affected leg, falls, change in sensation, fevers. Nausea from pain. Ibuprofen 600mg  w/o relief. Gradual worsened. Worse w/ movement.   The following portions of the patient's history were reviewed and updated as appropriate: allergies, current medications, past medical history, family and social history, and problem list.  Patient is a smoker.   Past Medical History  Diagnosis Date  . Essential hypertension, benign 05/06/2008    Qualifier: Diagnosis of  By: Darrol Angel MD, Harrold Donath    . Morbid obesity 05/06/2008    Qualifier: Diagnosis of  By: Darrol Angel MD, Harrold Donath    . DM (diabetes mellitus), type 2, uncontrolled 05/06/2008    Qualifier: Diagnosis of  By: Lanier Prude  MD, Cathrine Muster    . Anovulatory (dysfunctional uterine) bleeding 03/13/2012  . Depression with anxiety 03/18/2012    ROS as above otherwise neg.    Medications reviewed. Current Outpatient Prescriptions  Medication Sig Dispense Refill  . citalopram (CELEXA) 40 MG tablet Take 1 tablet (40 mg total) by mouth daily.  30 tablet  3  . gabapentin (NEURONTIN) 300 MG capsule Take 1 capsule (300 mg total) by mouth at bedtime.  90 capsule  1  . insulin aspart (NOVOLOG FLEXPEN) 100 UNIT/ML injection Use 1-20 units with each meal, per home sliding scale.  5 pen  12  . insulin glargine (LANTUS) 100 UNIT/ML injection 70 Units 2 (two) times daily. Inject 60 units qam and 60 units qpm, increase as instructed if fasting blood sugar >150      . lisinopril (PRINIVIL,ZESTRIL) 10 MG tablet Take 1 tablet (10 mg total) by mouth daily.  90 tablet  1  . metFORMIN (GLUCOPHAGE) 500 MG tablet Take 1 tablet (500 mg total) by mouth 2 (two) times daily with a meal.  60 tablet  2  . metoprolol succinate (TOPROL-XL) 25 MG 24 hr tablet Take 1 tablet (25 mg total)  by mouth daily.  90 tablet  2  . [DISCONTINUED] Liraglutide (VICTOZA) 18 MG/3ML SOLN Inject 0.3 mLs (1.8 mg total) into the skin daily.  6 mL  6  . [DISCONTINUED] olmesartan-hydrochlorothiazide (BENICAR HCT) 40-25 MG per tablet Take 1 tablet by mouth daily.  30 tablet  3   No current facility-administered medications for this visit.    Exam: BP 161/98  Pulse 66  Temp(Src) 98.7 F (37.1 C) (Oral)  Wt 354 lb (160.573 kg)  BMI 60.73 kg/m2  LMP 06/20/2013 Gen: Well NAD HEENT: EOMI,  MMM MSK: point tenderness in the L hip joint and in the SI joint area. ROM limited secondary to body habitus adn pain. Pain worst w/ hip extension and FABERS. Flexion w/ minimal pain. Soft tissue palpation w/ referred pain. L leg caliber similar to R. Minimal pain on internal and external rotation of hip  No results found for this or any previous visit (from the past 72 hour(s)).  Spent greater than in direct pt care.

## 2013-06-23 NOTE — Telephone Encounter (Signed)
Message copied by Henri Medal on Tue Jun 23, 2013  1:34 PM ------      Message from: [facility], DAVID J      Created: Tue Jun 23, 2013 12:41 PM       PLEASE call pt to inform of results of films. Cont w/ instructions as directed. Likely MSK strain. F/u PRN.  ------

## 2013-06-23 NOTE — Telephone Encounter (Signed)
Pt is aware of message from Dr. Konrad Dolores.

## 2013-06-23 NOTE — Patient Instructions (Addendum)
You need to go to [facility] for your xray. This will help [place] know if you have a broken bone Please rest your leg for the next few days.  Please only use the percocet as needed. THis will not be a chronic medicine for you Please use gentle massage and heat on your leg Please call if you develop severe leg swelling, chest pain, or shortness of breath This may all be from muscle strain.

## 2013-07-10 ENCOUNTER — Encounter (HOSPITAL_COMMUNITY): Payer: Self-pay | Admitting: Emergency Medicine

## 2013-07-10 ENCOUNTER — Emergency Department (INDEPENDENT_AMBULATORY_CARE_PROVIDER_SITE_OTHER)
Admission: EM | Admit: 2013-07-10 | Discharge: 2013-07-10 | Disposition: A | Discharge status: Home / Self Care | Payer: No Typology Code available for payment source | Source: Home / Self Care | Attending: Family Medicine | Admitting: Family Medicine

## 2013-07-10 ENCOUNTER — Emergency Department (INDEPENDENT_AMBULATORY_CARE_PROVIDER_SITE_OTHER): Payer: No Typology Code available for payment source

## 2013-07-10 DIAGNOSIS — M5380 Other specified dorsopathies, site unspecified: Secondary | ICD-10-CM

## 2013-07-10 DIAGNOSIS — G8929 Other chronic pain: Secondary | ICD-10-CM

## 2013-07-10 DIAGNOSIS — M25559 Pain in unspecified hip: Secondary | ICD-10-CM

## 2013-07-10 DIAGNOSIS — M62830 Muscle spasm of back: Secondary | ICD-10-CM

## 2013-07-10 MED ORDER — CYCLOBENZAPRINE HCL 10 MG PO TABS: 10.0000 mg | ORAL_TABLET | Freq: Two times a day (BID) | ORAL | Status: DC | PRN

## 2013-07-10 MED ORDER — CELECOXIB 200 MG PO CAPS: 200.0000 mg | ORAL_CAPSULE | Freq: Two times a day (BID) | ORAL | Status: DC

## 2013-07-10 MED ORDER — HYDROCODONE-ACETAMINOPHEN 5-325 MG PO TABS: 2.0000 | ORAL_TABLET | Freq: Three times a day (TID) | ORAL | Status: DC | PRN

## 2013-07-10 NOTE — ED Notes (Addendum)
Pt c/o lower back pain onset Monday. Pain is constant... Radiates down to bilateral legs; increases w/activity Denies: inj/trauma, abd pain, urinary sxs... 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 All w/no relief. Alert w/no signs of acute distress.

## 2013-07-10 NOTE — ED Provider Notes (Signed)
CSN: [id number]     Arrival date & time 07/10/13  1021 History     First MD Initiated Contact with Patient 07/10/13 1047     Chief Complaint  Patient presents with  . Back Pain   (Consider location/radiation/quality/duration/timing/severity/associated sxs/prior Treatment) HPI Comments: 27 year old female with history of diabetes, morbid obesity and chronic left hip pain. Here complaining of low back pain and left hip exacerbation for one week. 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 On the other hand denies known injury or recent falls that may have triggered her pain. 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 Reports her pain is currently severe. Has taken ibuprofen and tramadol inconsistently with minimal improvement. Reports low back pain with radiation to the left hip without radiation to the leg or left food. Denies lower extremity weakness, numbness or paresthesias. Denies urinary symptoms like frequency, dysuria or hematuria. Also denies urinary incontinence.   Past Medical History  Diagnosis Date  . Essential hypertension, benign 05/06/2008    Qualifier: Diagnosis of  By: Darrol Angel MD, Harrold Donath    . Morbid obesity 05/06/2008    Qualifier: Diagnosis of  By: Darrol Angel MD, Harrold Donath    . DM (diabetes mellitus), type 2, uncontrolled 05/06/2008    Qualifier: Diagnosis of  By: Lanier Prude  MD, Cathrine Muster    . Anovulatory (dysfunctional uterine) bleeding 03/13/2012  . Depression with anxiety 03/18/2012   History reviewed. No pertinent past surgical history. No family history on file. History  Substance Use Topics  . Smoking status: Current Every Day Smoker    Types: Cigars  . Smokeless tobacco: Not on file  . Alcohol Use: No   OB History   Grav Para Term Preterm Abortions TAB SAB Ect Mult Living                  Review of Systems  Constitutional: Negative for fever, chills, diaphoresis, appetite change and fatigue.  Gastrointestinal: Negative for nausea, vomiting, abdominal pain, diarrhea and constipation.  Genitourinary: Negative for frequency, hematuria, flank pain, vaginal bleeding, vaginal discharge and pelvic pain.  Musculoskeletal: Positive for back pain.  Skin: Negative for rash.  Neurological: Negative for weakness and numbness.    Allergies  Review of patient's allergies indicates no known allergies.  Home Medications   Current Outpatient Rx  Name  Route  Sig  Dispense  Refill  . gabapentin (NEURONTIN) 300 MG capsule   Oral   Take 1 capsule (300 mg total) by mouth at bedtime.   90 capsule   1   . insulin aspart (NOVOLOG FLEXPEN) 100 UNIT/ML injection      Use 1-20 units with each meal, per home sliding scale.   5 pen   12     To replace novolog vial   . insulin glargine (LANTUS) 100 UNIT/ML injection      70 Units 2 (two) times daily. Inject 60 units qam and 60 units qpm, increase as instructed if fasting blood sugar >150         . lisinopril (PRINIVIL,ZESTRIL) 10 MG tablet   Oral   Take 1 tablet (10 mg total) by mouth daily.   90 tablet   1   . metFORMIN (GLUCOPHAGE) 500 MG tablet   Oral   Take 1 tablet (500 mg total)  by mouth 2 (two) times daily with a meal.   60 tablet   2   . metoprolol succinate (TOPROL-XL) 25 MG 24 hr tablet   Oral   Take 1 tablet (25 mg total) by mouth daily.   90 tablet   2   . celecoxib (CELEBREX) 200 MG capsule   Oral   Take 1 capsule (200 mg total) by mouth 2 (two) times daily.   20 capsule   0   . EXPIRED: citalopram (CELEXA) 40 MG tablet   Oral   Take 1 tablet (40 mg total) by mouth daily.   30 tablet   3   . cyclobenzaprine (FLEXERIL) 10 MG tablet   Oral   Take 1 tablet (10 mg total) by mouth 2 (two) times daily as needed for muscle spasms.   20 tablet   0   . HYDROcodone-acetaminophen (NORCO/VICODIN) 5-325 MG  per tablet   Oral   Take 2 tablets by mouth every 8 (eight) hours as needed for pain.   15 tablet   0    BP 156/71  Pulse 71  Temp(Src) 98.4 F (36.9 C) (Oral)  Resp 22  SpO2 99%  LMP 06/20/2013 Physical Exam  Nursing note and vitals reviewed. Constitutional: She is oriented to person, place, and time.  Morbidly obese. Tearful. Standing in antalgic position semi reclaimed over examen table.   HENT:  Mouth/Throat: Oropharynx is clear and moist.  Eyes: No scleral icterus.  Cardiovascular: Normal heart sounds.   Pulmonary/Chest: Breath sounds normal.  Abdominal: Soft. There is no tenderness.  Massively obese  Musculoskeletal:  Central spine with no scoliosis or kyphosis. No cooperative to perform anterior flexion and posterior extension due to pain. Patient able to walk on tip toes and heels with no difficulty foot drop or reported pain exacerbation. No bone prominence tenderness. Tenderness to palpation in bilateral lumbar paravertebral muscles.   Intact sensation and symmetric + DTRs (rotullian and achillean) in low extremities. There is also reported pain in left hip. ROM difficult to asess due to body habitus and patient cooperation.. No obvious swelling or erythema on top of the joint. Patient is weight bearing.    Neurological: She is alert and oriented to person, place, and time.  Skin: No rash noted. She is not diaphoretic.    ED Course   Procedures (including critical care time)  Labs Reviewed - No data to display Dg Lumbar Spine Complete  07/10/2013   *RADIOLOGY REPORT*  Clinical Data: Low back pain radiates the larynx.  LUMBAR SPINE - COMPLETE 4+ VIEW  Comparison: None.  Findings: No evidence for fracture.  No subluxation. Intervertebral disc spaces are preserved throughout.  The facets are well-aligned bilaterally. SI joints are normal.  IMPRESSION: Normal exam.   Original Report Authenticated By: Kennith Center, M.D.   1. Muscle spasm of back   2. Chronic hip pain,  left     MDM  Treated with celebrex,  flexeril, norco 325/5mg  #15 tabs.  Asked to follow up with her PCP to discussed chronic pain management. Was made aware that we do not refill narcotics at [facility]. Supportive care including rehab exercises discussed with patient and provided in writing.   Sharin Grave, MD 07/11/13 [phone]

## 2013-08-15 ENCOUNTER — Encounter (HOSPITAL_COMMUNITY): Payer: Self-pay | Admitting: Emergency Medicine

## 2013-08-15 ENCOUNTER — Emergency Department (INDEPENDENT_AMBULATORY_CARE_PROVIDER_SITE_OTHER)
Admission: EM | Admit: 2013-08-15 | Discharge: 2013-08-15 | Disposition: A | Discharge status: Home / Self Care | Payer: No Typology Code available for payment source | Source: Home / Self Care | Attending: Family Medicine | Admitting: Family Medicine

## 2013-08-15 DIAGNOSIS — N970 Female infertility associated with anovulation: Secondary | ICD-10-CM

## 2013-08-15 LAB — POCT I-STAT, CHEM 8
BUN: 11 mg/dL (ref 6–23)
Calcium, Ion: 1.16 mmol/L (ref 1.12–1.23)
Chloride: 106 mEq/L (ref 96–112)
Creatinine, Ser: 0.8 mg/dL (ref 0.50–1.10)
Glucose, Bld: 143 mg/dL — ABNORMAL HIGH (ref 70–99)
HCT: 43 % (ref 36.0–46.0)
Hemoglobin: 14.6 g/dL (ref 12.0–15.0)
Potassium: 4.1 mEq/L (ref 3.5–5.1)
Sodium: 142 mEq/L (ref 135–145)
TCO2: 26 mmol/L (ref 0–100)

## 2013-08-15 MED ORDER — NORGESTIMATE-ETH ESTRADIOL 0.25-35 MG-MCG PO TABS: 1.0000 | ORAL_TABLET | Freq: Every day | ORAL | Status: DC

## 2013-08-15 MED ORDER — MELOXICAM 15 MG PO TABS: 15.0000 mg | ORAL_TABLET | Freq: Every day | ORAL | Status: DC | PRN

## 2013-08-15 NOTE — ED Notes (Signed)
Pt c/o heavy menstrual bleeding x12 days Has noticed blood clots... Going through 6 pads per day Sxs also include: back pain, nauseas Denies fevers Alert w/no signs of acute distress.

## 2013-08-15 NOTE — ED Provider Notes (Signed)
Krista Adams is a 27 y.o. female who presents to Urgent Care today for heavy menstrual bleeding now for 12 days. Patient has had frequent episodes of this in the past however her bleeding currently is heavier than usual. She denies any lightheadedness chest pains palpitations or dizziness. She feels well otherwise. She notes menstrual cramping as well. She has had trials of birth control pills which have worked in the past as well as progesterone. She has been offered Mirena IUD but has declined. Her most recent GYN ultrasound was about 4 years ago. She denies any fevers or chills nausea vomiting or diarrhea.    Past Medical History  Diagnosis Date  . Essential hypertension, benign 05/06/2008    Qualifier: Diagnosis of  By: Darrol Angel MD, Harrold Donath    . Morbid obesity 05/06/2008    Qualifier: Diagnosis of  By: Darrol Angel MD, Harrold Donath    . DM (diabetes mellitus), type 2, uncontrolled 05/06/2008    Qualifier: Diagnosis of  By: Lanier Prude  MD, Cathrine Muster    . Anovulatory (dysfunctional uterine) bleeding 03/13/2012  . Depression with anxiety 03/18/2012   History  Substance Use Topics  . Smoking status: Current Every Day Smoker    Types: Cigars  . Smokeless tobacco: Not on file  . Alcohol Use: No   ROS as above Medications reviewed. No current facility-administered medications for this encounter.   Current Outpatient Prescriptions  Medication Sig Dispense Refill  . insulin aspart (NOVOLOG FLEXPEN) 100 UNIT/ML injection Use 1-20 units with each meal, per home sliding scale.  5 pen  12  . insulin glargine (LANTUS) 100 UNIT/ML injection 70 Units 2 (two) times daily. Inject 60 units qam and 60 units qpm, increase as instructed if fasting blood sugar >150      . celecoxib (CELEBREX) 200 MG capsule Take 1 capsule (200 mg total) by mouth 2 (two) times daily.  20 capsule  0  . citalopram (CELEXA) 40 MG tablet Take 1 tablet (40 mg total) by mouth daily.  30 tablet  3  . cyclobenzaprine (FLEXERIL) 10 MG tablet Take 1  tablet (10 mg total) by mouth 2 (two) times daily as needed for muscle spasms.  20 tablet  0  . gabapentin (NEURONTIN) 300 MG capsule Take 1 capsule (300 mg total) by mouth at bedtime.  90 capsule  1  . HYDROcodone-acetaminophen (NORCO/VICODIN) 5-325 MG per tablet Take 2 tablets by mouth every 8 (eight) hours as needed for pain.  15 tablet  0  . lisinopril (PRINIVIL,ZESTRIL) 10 MG tablet Take 1 tablet (10 mg total) by mouth daily.  90 tablet  1  . meloxicam (MOBIC) 15 MG tablet Take 1 tablet (15 mg total) by mouth daily as needed for pain.  30 tablet  0  . metFORMIN (GLUCOPHAGE) 500 MG tablet Take 1 tablet (500 mg total) by mouth 2 (two) times daily with a meal.  60 tablet  2  . metoprolol succinate (TOPROL-XL) 25 MG 24 hr tablet Take 1 tablet (25 mg total) by mouth daily.  90 tablet  2  . norgestimate-ethinyl estradiol (ORTHO-CYCLEN,SPRINTEC,PREVIFEM) 0.25-35 MG-MCG tablet Take 1 tablet by mouth daily.  1 Package  1  . [DISCONTINUED] Liraglutide (VICTOZA) 18 MG/3ML SOLN Inject 0.3 mLs (1.8 mg total) into the skin daily.  6 mL  6  . [DISCONTINUED] olmesartan-hydrochlorothiazide (BENICAR HCT) 40-25 MG per tablet Take 1 tablet by mouth daily.  30 tablet  3    Exam:  BP 137/66  Pulse 73  Temp(Src) 98.1 F (36.7  C) (Oral)  Resp 17  SpO2 97%  LMP 08/15/2013 Gen: Well NAD HEENT: EOMI,  MMM Lungs: CTABL Nl WOB Heart: RRR no MRG Abd: NABS, nondistended mildly tender to palpation no rebound or guarding Exts: Non edematous BL  LE, warm and well perfused.   Results for orders placed during the hospital encounter of 08/15/13 (from the past 24 hour(s))  POCT I-STAT, CHEM 8     Status: Abnormal   Collection Time    08/15/13  1:20 PM      Result Value Range   Sodium 142  135 - 145 mEq/L   Potassium 4.1  3.5 - 5.1 mEq/L   Chloride 106  96 - 112 mEq/L   BUN 11  6 - 23 mg/dL   Creatinine, Ser 1.61  0.50 - 1.10 mg/dL   Glucose, Bld 096 (*) 70 - 99 mg/dL   Calcium, Ion 0.45  4.09 - 1.23 mmol/L    TCO2 26  0 - 100 mmol/L   Hemoglobin 14.6  12.0 - 15.0 g/dL   HCT 81.1  91.4 - 78.2 %   No results found.  Assessment and Plan: 27 y.o. female with heavy menstrual bleeding with a history of dysfunctional uterine bleeding.  Plan the patient patient back on Sprintec birth control pill. Additionally I will use meloxicam for cramping. Followup with primary care provider or OB/GYN.  Emphasize weight loss.  Discussed warning signs or symptoms. Please see discharge instructions. Patient expresses understanding.      Rodolph Bong, MD 08/15/13 1340

## 2013-08-26 ENCOUNTER — Encounter: Payer: Self-pay | Admitting: Family Medicine

## 2013-08-26 ENCOUNTER — Ambulatory Visit (INDEPENDENT_AMBULATORY_CARE_PROVIDER_SITE_OTHER): Payer: No Typology Code available for payment source | Admitting: Family Medicine

## 2013-08-26 VITALS — BP 147/84 | HR 65 | Temp 98.8°F | Ht 64.0 in | Wt 345.0 lb

## 2013-08-26 DIAGNOSIS — E1165 Type 2 diabetes mellitus with hyperglycemia: Secondary | ICD-10-CM

## 2013-08-26 DIAGNOSIS — IMO0001 Reserved for inherently not codable concepts without codable children: Secondary | ICD-10-CM

## 2013-08-26 DIAGNOSIS — N926 Irregular menstruation, unspecified: Secondary | ICD-10-CM

## 2013-08-26 DIAGNOSIS — I10 Essential (primary) hypertension: Secondary | ICD-10-CM

## 2013-08-26 LAB — POCT GLYCOSYLATED HEMOGLOBIN (HGB A1C): Hemoglobin A1C: 8.1

## 2013-08-26 LAB — GLUCOSE, CAPILLARY: Glucose-Capillary: 233 mg/dL — ABNORMAL HIGH (ref 70–99)

## 2013-08-26 MED ORDER — LISINOPRIL 10 MG PO TABS: 10.0000 mg | ORAL_TABLET | Freq: Every day | ORAL | Status: DC

## 2013-08-26 MED ORDER — INSULIN ASPART 100 UNIT/ML ~~LOC~~ SOLN: SUBCUTANEOUS | Status: DC

## 2013-08-26 MED ORDER — METFORMIN HCL 500 MG PO TABS: 500.0000 mg | ORAL_TABLET | Freq: Two times a day (BID) | ORAL | Status: DC

## 2013-08-26 MED ORDER — BLOOD GLUCOSE MONITORING SUPPL W/DEVICE KIT: PACK | Status: DC

## 2013-08-26 MED ORDER — METOPROLOL SUCCINATE ER 25 MG PO TB24: 25.0000 mg | ORAL_TABLET | Freq: Every day | ORAL | Status: DC

## 2013-08-26 MED ORDER — INSULIN GLARGINE 100 UNIT/ML ~~LOC~~ SOLN: 70.0000 [IU] | Freq: Two times a day (BID) | SUBCUTANEOUS | Status: DC

## 2013-08-26 NOTE — Patient Instructions (Addendum)
It was nice to meet you today!  For diabetes: I sent in a refill on your insulin and metformin, also a prescription for a glucose meter kit. Let us know if it is too expensive for you. Check your sugars four times daily and write down the #'s, bring that with you to your next visit. If less than 80 please eat something sugary and call our office. Keep up the great work with weight loss! Have your eye doctor send their records to [place].  For blood pressure: I refilled all your BP meds.  Come back in 2 weeks to recheck the blood pressure and see how you are doing.  I recommend you have a pap smear. This is an important test to screen for cervical cancer. Please schedule a separate appointment to have this test done.   Be well, Dr. Pollie Meyer

## 2013-08-26 NOTE — Progress Notes (Signed)
Patient ID: Krista Adams, female   DOB: August 08, 1986, 27 y.o.   MRN: [id number]  HPI:  Diabetes: patient reports for f/u of diabetes. States she has been out of her novolog for one day and out of her lantus for 3 weeks. Prior to running out had taken Lantus 70 units BID and then novolog sliding scale up to 25 units with meals. Needs a new glucometer. Has felt tired today and had a poor appetite & headache, which she attributes to her blood sugars being out of control. Has had a recent eye exam.  Weight loss: has been working hard to lose weight. She exercises every morning (does stomach crunches, running in place, situps, arm circles). Also has been watching what she's eating. She has been working out for 3 weeks now and has found that she has more energy. She is very motivated to continue losing weight. Has cut out breads, eats rice cakes instead of chips, also eats fruit, carrots, salads.   Blood pressure: has been out of her BP medicine x 5 days. Normally takes lisinopril 10mg  daily. Does not check her BP at home. See ROS below.  ROS: See HPI. No CP, SOB, no swelling in legs. + HA today.  PMFSH: works as Lawyer  PHYSICAL EXAM: BP 157/127  Pulse 65  Temp(Src) 98.8 F (37.1 C) (Oral)  Ht 5\' 4"  (1.626 m)  Wt 345 lb (156.491 kg)  BMI 59.19 kg/m2  LMP 08/15/2013 Gen: NAD, well appearing, friendly HEENT: PERRL Heart: RRR Lungs: CTAB, NWOB Neuro: grossly nonfocal, speech intact Ext: normal diabetic foot exam bilaterally (1+ DP pulses bilat, normal monofilament exam)  ASSESSMENT/PLAN:  # Health maintenance:  -recommended pap smear to patient  # Teratogenic medications: -reminded patient that her medicines are not all compatible with pregnancy, so she needs to let us know if she decides to try to get pregnant so we can adjust her medicines.  See problem based charting for additional assessment/plan.   FOLLOW UP: F/u in 2 weeks for BP recheck & f/u on blood sugars.

## 2013-08-27 ENCOUNTER — Other Ambulatory Visit: Payer: Self-pay | Admitting: Family Medicine

## 2013-08-27 ENCOUNTER — Telehealth: Payer: Self-pay | Admitting: Family Medicine

## 2013-08-27 ENCOUNTER — Encounter: Payer: Self-pay | Admitting: Pharmacist

## 2013-08-27 MED ORDER — INSULIN GLARGINE 100 UNIT/ML ~~LOC~~ SOLN: 70.0000 [IU] | Freq: Two times a day (BID) | SUBCUTANEOUS | Status: DC

## 2013-08-27 MED ORDER — INSULIN ASPART 100 UNIT/ML ~~LOC~~ SOLN: SUBCUTANEOUS | Status: DC

## 2013-08-27 NOTE — Telephone Encounter (Signed)
Will fwd. To Dr.McIntyre for review. .Krista Adams  

## 2013-08-27 NOTE — Progress Notes (Signed)
Novolog sample provided 08/27/13 - patient to pick up Lot [id number] Exp 05-02-14 Quantity: 3 vials  Lantus sample provided 08/27/13 - patient to pick up Lot [id number] Exp 01-30-15 Quantity: 3 vials

## 2013-08-27 NOTE — Telephone Encounter (Signed)
Pt is having problem getting her insulin since her insurance does not cover her insulin. She has [employer] and doesn't know what to do. Because she would have to pay 400.00 ti get her insulin. She can not go to [facility] because she has [place]. She was wondering if we had samples or she used to take a pill a few years ago called Tramden or pradmon. She would like Dr. Pollie Meyer to help her find a way to get this taken care of. JW

## 2013-08-27 NOTE — Telephone Encounter (Signed)
Returned call to patient. It is possible that her insurance would prefer different brands of insulin (perhaps Levemir & Humalog instead of Lantus & Novolog). Will set aside one month's worth of samples for her so that she can have insulin for now, but I did instruct her to call her insurance company and find out what kind of insulin they will cover. She agrees to do this and will call us back with that information.  Latrelle Dodrill, MD

## 2013-08-31 NOTE — Assessment & Plan Note (Signed)
Praised her efforts at weight loss, encouraged continued work with diet and exercise.

## 2013-08-31 NOTE — Assessment & Plan Note (Addendum)
A1c 8.1, which is fairly good given that she has likely had very uncontrolled sugars for the last several weeks secondary to being out of insulin. Rx given for new meter kit today. Will also refill all insulin and have her f/u in 2 weeks to ensure her CBG's are better controlled. She reports feeling poorly today secondary to hyperglycemia, but a CBG was only 233, so very unlikely to be DKA or similar severely hyperglycemic state with this relatively unelevated CBG.  Cardiac: need to address lipids & aspirin at future visit Renal: on ACE inhibitor Eye: pt to send eye records to [place] Foot: normal foot exam today, repeat in 1 year

## 2013-08-31 NOTE — Assessment & Plan Note (Signed)
BP elevated today but has not been taking medicine x 5 days due to running out. Will refill meds & have pt f/u in 2 weeks for BP recheck.

## 2013-08-31 NOTE — Assessment & Plan Note (Signed)
>>  ASSESSMENT AND PLAN FOR DM (DIABETES MELLITUS), TYPE 2, UNCONTROLLED WRITTEN ON 08/31/2013 11:25 PM BY MCINTYRE, BRITTANY J, MD  A1c 8.1, which is fairly good given that she has likely had very uncontrolled sugars for the last several weeks secondary to being out of insulin. Rx given for new meter kit today. Will also refill all insulin and have her f/u in 2 weeks to ensure her CBG's are better controlled. She reports feeling poorly today secondary to hyperglycemia, but a CBG was only 233, so very unlikely to be DKA or similar severely hyperglycemic state with this relatively unelevated CBG.  Cardiac: need to address lipids & aspirin at future visit Renal: on ACE inhibitor Eye: pt to send eye records to [place] Foot: normal foot exam today, repeat in 1 year

## 2013-10-10 ENCOUNTER — Inpatient Hospital Stay (HOSPITAL_COMMUNITY): Payer: No Typology Code available for payment source

## 2013-10-10 ENCOUNTER — Inpatient Hospital Stay (HOSPITAL_COMMUNITY)
Admission: AD | Admit: 2013-10-10 | Discharge: 2013-10-10 | Disposition: A | Discharge status: Home / Self Care | Payer: No Typology Code available for payment source | Source: Ambulatory Visit | Attending: Family Medicine | Admitting: Family Medicine

## 2013-10-10 ENCOUNTER — Encounter (HOSPITAL_COMMUNITY): Payer: Self-pay | Admitting: *Deleted

## 2013-10-10 DIAGNOSIS — Z794 Long term (current) use of insulin: Secondary | ICD-10-CM | POA: Insufficient documentation

## 2013-10-10 DIAGNOSIS — N925 Other specified irregular menstruation: Secondary | ICD-10-CM | POA: Insufficient documentation

## 2013-10-10 DIAGNOSIS — N898 Other specified noninflammatory disorders of vagina: Secondary | ICD-10-CM

## 2013-10-10 DIAGNOSIS — N939 Abnormal uterine and vaginal bleeding, unspecified: Secondary | ICD-10-CM

## 2013-10-10 DIAGNOSIS — I10 Essential (primary) hypertension: Secondary | ICD-10-CM | POA: Insufficient documentation

## 2013-10-10 DIAGNOSIS — N949 Unspecified condition associated with female genital organs and menstrual cycle: Secondary | ICD-10-CM | POA: Insufficient documentation

## 2013-10-10 DIAGNOSIS — E119 Type 2 diabetes mellitus without complications: Secondary | ICD-10-CM | POA: Insufficient documentation

## 2013-10-10 DIAGNOSIS — N938 Other specified abnormal uterine and vaginal bleeding: Secondary | ICD-10-CM | POA: Insufficient documentation

## 2013-10-10 LAB — CBC
HCT: 36.8 % (ref 36.0–46.0)
Hemoglobin: 11.9 g/dL — ABNORMAL LOW (ref 12.0–15.0)
MCH: 27.6 pg (ref 26.0–34.0)
MCHC: 32.3 g/dL (ref 30.0–36.0)
MCV: 85.4 fL (ref 78.0–100.0)
Platelets: 296 10*3/uL (ref 150–400)
RBC: 4.31 MIL/uL (ref 3.87–5.11)
RDW: 15.1 % (ref 11.5–15.5)
WBC: 5.2 10*3/uL (ref 4.0–10.5)

## 2013-10-10 LAB — WET PREP, GENITAL
Trich, Wet Prep: NONE SEEN
Yeast Wet Prep HPF POC: NONE SEEN

## 2013-10-10 LAB — URINALYSIS, ROUTINE W REFLEX MICROSCOPIC
Bilirubin Urine: NEGATIVE
Glucose, UA: NEGATIVE mg/dL
Ketones, ur: NEGATIVE mg/dL
Leukocytes, UA: NEGATIVE
Nitrite: NEGATIVE
Protein, ur: NEGATIVE mg/dL
Specific Gravity, Urine: 1.025 (ref 1.005–1.030)
Urobilinogen, UA: 0.2 mg/dL (ref 0.0–1.0)
pH: 6 (ref 5.0–8.0)

## 2013-10-10 LAB — GLUCOSE, CAPILLARY: Glucose-Capillary: 119 mg/dL — ABNORMAL HIGH (ref 70–99)

## 2013-10-10 LAB — URINE MICROSCOPIC-ADD ON

## 2013-10-10 LAB — HCG, SERUM, QUALITATIVE: Preg, Serum: NEGATIVE

## 2013-10-10 MED ORDER — IBUPROFEN 600 MG PO TABS: 600.0000 mg | ORAL_TABLET | Freq: Four times a day (QID) | ORAL | Status: DC | PRN

## 2013-10-10 MED ORDER — MEGESTROL ACETATE 40 MG PO TABS: ORAL_TABLET | ORAL | Status: DC

## 2013-10-10 MED ORDER — KETOROLAC TROMETHAMINE 60 MG/2ML IM SOLN
60.0000 mg | Freq: Once | INTRAMUSCULAR | Status: AC
  Administered 2013-10-10: 60 mg via INTRAMUSCULAR
  Filled 2013-10-10: qty 2

## 2013-10-10 NOTE — MAU Provider Note (Signed)
History     CSN: [id number]  Arrival date and time: 10/10/13 1214   First Provider Initiated Contact with Patient 10/10/13 1246      Chief Complaint  Patient presents with  . Vaginal Bleeding   HPI Abby Potash 27 y.o. Client comes to MAU today with heavy bleeding since Oct.2.  Sandi Mealy also an extended time in Sept.  Has been over 2 years since she has seen a GYN doctor.  Has previously used birth control pills or nuvaring to control irregular bleeding.  Current diagnoses are Diabetes Type 2 on insulin, morbid obesity and hypertension.  Today the bleeding has been heavy with blood clots and cramping.  Has not taken any medication for pain today.  Has bled through her clothes a number of times.  Is very tired of bleeding and also is not feeling good.  States she is weak and has no appetite.  OB History   Grav Para Term Preterm Abortions TAB SAB Ect Mult Living                  Past Medical History  Diagnosis Date  . Essential hypertension, benign 05/06/2008    Qualifier: Diagnosis of  By: Darrol Angel MD, Harrold Donath    . Morbid obesity 05/06/2008    Qualifier: Diagnosis of  By: Darrol Angel MD, Harrold Donath    . DM (diabetes mellitus), type 2, uncontrolled 05/06/2008    Qualifier: Diagnosis of  By: Lanier Prude  MD, Cathrine Muster    . Anovulatory (dysfunctional uterine) bleeding 03/13/2012  . Depression with anxiety 03/18/2012    History reviewed. No pertinent past surgical history.  Family History  Problem Relation Age of Onset  . Diabetes Mother   . Diabetes Maternal Aunt   . Diabetes Maternal Grandmother     History  Substance Use Topics  . Smoking status: Current Every Day Smoker    Types: Cigars  . Smokeless tobacco: Not on file  . Alcohol Use: No    Allergies: No Known Allergies  Prescriptions prior to admission  Medication Sig Dispense Refill  . Blood Glucose Monitoring Suppl W/DEVICE KIT Check blood sugar 4 times daily.  1 each  0  . celecoxib (CELEBREX) 200 MG capsule Take 1 capsule (200  mg total) by mouth 2 (two) times daily.  20 capsule  0  . citalopram (CELEXA) 40 MG tablet Take 1 tablet (40 mg total) by mouth daily.  30 tablet  3  . cyclobenzaprine (FLEXERIL) 10 MG tablet Take 1 tablet (10 mg total) by mouth 2 (two) times daily as needed for muscle spasms.  20 tablet  0  . gabapentin (NEURONTIN) 300 MG capsule Take 1 capsule (300 mg total) by mouth at bedtime.  90 capsule  1  . HYDROcodone-acetaminophen (NORCO/VICODIN) 5-325 MG per tablet Take 2 tablets by mouth every 8 (eight) hours as needed for pain.  15 tablet  0  . insulin aspart (NOVOLOG FLEXPEN) 100 UNIT/ML injection Use 1-20 units with each meal, per home sliding scale.  5 pen  12  . insulin aspart (NOVOLOG) 100 UNIT/ML injection Dose per home sliding scale 3 times daily  3 vial  0  . insulin glargine (LANTUS) 100 UNIT/ML injection Inject 0.7 mLs (70 Units total) into the skin 2 (two) times daily.  10 mL  2  . insulin glargine (LANTUS) 100 UNIT/ML injection Inject 0.7 mLs (70 Units total) into the skin 2 (two) times daily.  3 vial  0  . lisinopril (PRINIVIL,ZESTRIL) 10 MG tablet Take  1 tablet (10 mg total) by mouth daily.  90 tablet  1  . meloxicam (MOBIC) 15 MG tablet Take 1 tablet (15 mg total) by mouth daily as needed for pain.  30 tablet  0  . metFORMIN (GLUCOPHAGE) 500 MG tablet Take 1 tablet (500 mg total) by mouth 2 (two) times daily with a meal.  180 tablet  1  . metoprolol succinate (TOPROL-XL) 25 MG 24 hr tablet Take 1 tablet (25 mg total) by mouth daily.  90 tablet  1  . norgestimate-ethinyl estradiol (ORTHO-CYCLEN,SPRINTEC,PREVIFEM) 0.25-35 MG-MCG tablet Take 1 tablet by mouth daily.  1 Package  1    Review of Systems  Constitutional: Negative for fever.  Gastrointestinal: Positive for abdominal pain. Negative for nausea, vomiting, diarrhea and constipation.  Genitourinary:       No vaginal discharge. Vaginal bleeding. No dysuria.   Physical Exam   Blood pressure 152/86, pulse 74, temperature 98 F  (36.7 C), temperature source Oral, resp. rate 18.  Physical Exam  Nursing note and vitals reviewed. Constitutional: She is oriented to person, place, and time. She appears well-developed and well-nourished.  Morbid obesity  HENT:  Head: Normocephalic.  Eyes: EOM are normal.  Neck: Neck supple.  GI: Soft. There is tenderness. There is no rebound and no guarding.  Genitourinary:  Speculum exam: Vagina - Small amount of dark blood in vagina, no odor Cervix - barely able to visualize with speculum Bimanual exam: Cervix closed Uterus unable to size due to habitus Adnexa exam limited due to habitus GC/Chlam, wet prep done Chaperone present for exam.  Musculoskeletal: Normal range of motion.  Neurological: She is alert and oriented to person, place, and time.  Skin: Skin is warm and dry.  Psychiatric: She has a normal mood and affect.    MAU Course  Procedures Results for orders placed during the hospital encounter of 10/10/13 (from the past 24 hour(s))  GLUCOSE, CAPILLARY     Status: Abnormal   Collection Time    10/10/13 12:38 PM      Result Value Range   Glucose-Capillary 119 (*) 70 - 99 mg/dL  CBC     Status: Abnormal   Collection Time    10/10/13 12:46 PM      Result Value Range   WBC 5.2  4.0 - 10.5 K/uL   RBC 4.31  3.87 - 5.11 MIL/uL   Hemoglobin 11.9 (*) 12.0 - 15.0 g/dL   HCT 56.2  13.0 - 86.5 %   MCV 85.4  78.0 - 100.0 fL   MCH 27.6  26.0 - 34.0 pg   MCHC 32.3  30.0 - 36.0 g/dL   RDW 78.4  69.6 - 29.5 %   Platelets 296  150 - 400 K/uL  HCG, SERUM, QUALITATIVE     Status: None   Collection Time    10/10/13 12:46 PM      Result Value Range   Preg, Serum NEGATIVE  NEGATIVE  WET PREP, GENITAL     Status: Abnormal   Collection Time    10/10/13 12:59 PM      Result Value Range   Yeast Wet Prep HPF POC NONE SEEN  NONE SEEN   Trich, Wet Prep NONE SEEN  NONE SEEN   Clue Cells Wet Prep HPF POC FEW (*) NONE SEEN   WBC, Wet Prep HPF POC FEW (*) NONE SEEN   URINALYSIS, ROUTINE W REFLEX MICROSCOPIC     Status: Abnormal   Collection Time    10/10/13  1:44 PM      Result Value Range   Color, Urine YELLOW  YELLOW   APPearance HAZY (*) CLEAR   Specific Gravity, Urine 1.025  1.005 - 1.030   pH 6.0  5.0 - 8.0   Glucose, UA NEGATIVE  NEGATIVE mg/dL   Hgb urine dipstick LARGE (*) NEGATIVE   Bilirubin Urine NEGATIVE  NEGATIVE   Ketones, ur NEGATIVE  NEGATIVE mg/dL   Protein, ur NEGATIVE  NEGATIVE mg/dL   Urobilinogen, UA 0.2  0.0 - 1.0 mg/dL   Nitrite NEGATIVE  NEGATIVE   Leukocytes, UA NEGATIVE  NEGATIVE  URINE MICROSCOPIC-ADD ON     Status: Abnormal   Collection Time    10/10/13  1:44 PM      Result Value Range   Squamous Epithelial / LPF FEW (*) RARE   WBC, UA 0-2  <3 WBC/hpf   RBC / HPF 11-20  <3 RBC/hpf   Bacteria, UA RARE  RARE    MDM CLINICAL DATA: Abnormal vaginal bleeding  EXAM:  TRANSABDOMINAL AND TRANSVAGINAL ULTRASOUND OF PELVIS  TECHNIQUE:  Both transabdominal and transvaginal ultrasound examinations of the  pelvis were performed. Transabdominal technique was performed for  global imaging of the pelvis including uterus, ovaries, adnexal  regions, and pelvic cul-de-sac. It was necessary to proceed with  endovaginal exam following the transabdominal exam to visualize the  endometrium and bilateral adnexae.  COMPARISON: 09/09/2009  FINDINGS:  Uterus  Measurements: 7.3 x 3.5 x 4.4 cm. No fibroids or other mass  visualized.  Endometrium  Thickness: 12 mm. No focal abnormality visualized.  Right ovary  Not visualized transabdominally or transvaginally.  Left ovary  Not visualized transabdominally or transvaginally.  Other findings  No free fluid.  IMPRESSION:  Endometrial complex measures 12 mm.  Bilateral ovaries are not discretely visualized.   1500 Dr. Debroah Loop consulted re: plan of care  Assessment and Plan  Abnormal vaginal bleeding.  Plan rx Megace 40 mg PO BID til the bleeding stops and the one PO Qday  until seen in the GYN clinic. Message seen for a follow up in the GYN clinic.  Client to be called. Rx ibuprofen 600 mg one PO q 6 hours with food for pain. Continue your other medications as prescribed. You can take an over the counter iron supplement if desired.  Zuria Fosdick 10/10/2013, 1:02 PM

## 2013-10-10 NOTE — MAU Note (Signed)
Patient presents to MAu with c/o of bleeding off and on for the last 2 months. Progressively getting worse. Currently having to wear 2 pads and a tampon. States saturating all within an hour. REports quarter size clots. States feeling light headed, weak, and exhausted. Reports cramping in back.

## 2013-10-13 LAB — GC/CHLAMYDIA PROBE AMP
CT Probe RNA: POSITIVE — AB
GC Probe RNA: NEGATIVE

## 2013-10-14 ENCOUNTER — Ambulatory Visit (INDEPENDENT_AMBULATORY_CARE_PROVIDER_SITE_OTHER): Payer: No Typology Code available for payment source | Admitting: General Practice

## 2013-10-14 ENCOUNTER — Telehealth: Payer: Self-pay | Admitting: General Practice

## 2013-10-14 VITALS — BP 152/94 | HR 77 | Temp 97.9°F | Ht 64.0 in | Wt 337.5 lb

## 2013-10-14 DIAGNOSIS — A749 Chlamydial infection, unspecified: Secondary | ICD-10-CM

## 2013-10-14 MED ORDER — AZITHROMYCIN 1 G PO PACK
1.0000 g | PACK | Freq: Once | ORAL | Status: AC
  Administered 2013-10-14: 1 g via ORAL

## 2013-10-14 NOTE — Telephone Encounter (Signed)
Patient called and left message stating she has a question and would like a call back.Called patient back and she stated she just wanted to make sure it is okay to drink alcohol since she got the pills today (zithromax). Told patient there wasn't any contraindication to alcohol with that medication so she shouldn't have a problem with it. Patient verbalized understanding and had no further questions

## 2013-10-14 NOTE — Progress Notes (Signed)
Patient states she forgot to take her BP medication this morning before she left the house- instructed patient to take

## 2013-11-20 ENCOUNTER — Encounter: Payer: No Typology Code available for payment source | Admitting: Obstetrics & Gynecology

## 2014-01-15 ENCOUNTER — Telehealth: Payer: Self-pay | Admitting: Family Medicine

## 2014-01-15 NOTE — Telephone Encounter (Signed)
Patient is not able to afford insulin. Was on MAP but not able to use program because she now has healthcare through the Healthcare Act. I asking for samples of insulins. She has been without for a while now.

## 2014-01-15 NOTE — Telephone Encounter (Signed)
Called pt. LMVM to call back.  Last OV in our office 08/2013. Pt needs OV. Krista Adams, Krista Adams

## 2014-02-11 ENCOUNTER — Ambulatory Visit: Payer: No Typology Code available for payment source | Admitting: Family Medicine

## 2014-03-01 ENCOUNTER — Ambulatory Visit (INDEPENDENT_AMBULATORY_CARE_PROVIDER_SITE_OTHER): Payer: No Typology Code available for payment source | Admitting: *Deleted

## 2014-03-01 DIAGNOSIS — Z111 Encounter for screening for respiratory tuberculosis: Secondary | ICD-10-CM

## 2014-03-03 ENCOUNTER — Encounter: Payer: Self-pay | Admitting: *Deleted

## 2014-03-03 ENCOUNTER — Ambulatory Visit (INDEPENDENT_AMBULATORY_CARE_PROVIDER_SITE_OTHER): Payer: No Typology Code available for payment source | Admitting: *Deleted

## 2014-03-03 DIAGNOSIS — Z111 Encounter for screening for respiratory tuberculosis: Secondary | ICD-10-CM

## 2014-03-03 LAB — TB SKIN TEST
Induration: 0 mm
TB Skin Test: NEGATIVE

## 2014-03-10 ENCOUNTER — Telehealth: Payer: Self-pay | Admitting: Family Medicine

## 2014-03-10 MED ORDER — LISINOPRIL 10 MG PO TABS: 10.0000 mg | ORAL_TABLET | Freq: Every day | ORAL | Status: DC

## 2014-03-10 NOTE — Telephone Encounter (Signed)
Rx sent in. Patient should keep scheduled appointment. Leeanne Rio, MD

## 2014-03-10 NOTE — Telephone Encounter (Signed)
Will fwd to PCP for refill. Pt has upcoming appt 04/05/14. Mauricia Area

## 2014-03-10 NOTE — Telephone Encounter (Signed)
Pt called to get a refill on her BP medication. She only has one week left. jw

## 2014-03-31 ENCOUNTER — Encounter (HOSPITAL_COMMUNITY): Payer: Self-pay | Admitting: Emergency Medicine

## 2014-03-31 ENCOUNTER — Emergency Department (HOSPITAL_COMMUNITY)
Admission: EM | Admit: 2014-03-31 | Discharge: 2014-03-31 | Disposition: A | Discharge status: Home / Self Care | Payer: No Typology Code available for payment source | Attending: Emergency Medicine | Admitting: Emergency Medicine

## 2014-03-31 DIAGNOSIS — S0993XA Unspecified injury of face, initial encounter: Secondary | ICD-10-CM | POA: Insufficient documentation

## 2014-03-31 DIAGNOSIS — Z79899 Other long term (current) drug therapy: Secondary | ICD-10-CM | POA: Insufficient documentation

## 2014-03-31 DIAGNOSIS — E1165 Type 2 diabetes mellitus with hyperglycemia: Secondary | ICD-10-CM

## 2014-03-31 DIAGNOSIS — Z794 Long term (current) use of insulin: Secondary | ICD-10-CM | POA: Insufficient documentation

## 2014-03-31 DIAGNOSIS — F17200 Nicotine dependence, unspecified, uncomplicated: Secondary | ICD-10-CM | POA: Insufficient documentation

## 2014-03-31 DIAGNOSIS — S199XXA Unspecified injury of neck, initial encounter: Secondary | ICD-10-CM | POA: Insufficient documentation

## 2014-03-31 DIAGNOSIS — M79602 Pain in left arm: Secondary | ICD-10-CM

## 2014-03-31 DIAGNOSIS — I10 Essential (primary) hypertension: Secondary | ICD-10-CM | POA: Insufficient documentation

## 2014-03-31 DIAGNOSIS — Y9389 Activity, other specified: Secondary | ICD-10-CM | POA: Insufficient documentation

## 2014-03-31 DIAGNOSIS — S46909A Unspecified injury of unspecified muscle, fascia and tendon at shoulder and upper arm level, unspecified arm, initial encounter: Secondary | ICD-10-CM | POA: Insufficient documentation

## 2014-03-31 DIAGNOSIS — IMO0001 Reserved for inherently not codable concepts without codable children: Secondary | ICD-10-CM | POA: Insufficient documentation

## 2014-03-31 DIAGNOSIS — F341 Dysthymic disorder: Secondary | ICD-10-CM | POA: Insufficient documentation

## 2014-03-31 DIAGNOSIS — Y92410 Unspecified street and highway as the place of occurrence of the external cause: Secondary | ICD-10-CM | POA: Insufficient documentation

## 2014-03-31 DIAGNOSIS — Z8742 Personal history of other diseases of the female genital tract: Secondary | ICD-10-CM | POA: Insufficient documentation

## 2014-03-31 DIAGNOSIS — S4980XA Other specified injuries of shoulder and upper arm, unspecified arm, initial encounter: Secondary | ICD-10-CM | POA: Insufficient documentation

## 2014-03-31 MED ORDER — HYDROCODONE-ACETAMINOPHEN 5-325 MG PO TABS: 2.0000 | ORAL_TABLET | ORAL | Status: DC | PRN

## 2014-03-31 MED ORDER — METHOCARBAMOL 500 MG PO TABS
500.0000 mg | ORAL_TABLET | Freq: Once | ORAL | Status: AC
  Administered 2014-03-31: 500 mg via ORAL
  Filled 2014-03-31: qty 1

## 2014-03-31 MED ORDER — IBUPROFEN 800 MG PO TABS: 800.0000 mg | ORAL_TABLET | Freq: Three times a day (TID) | ORAL | Status: DC

## 2014-03-31 MED ORDER — HYDROCODONE-ACETAMINOPHEN 5-325 MG PO TABS
2.0000 | ORAL_TABLET | Freq: Once | ORAL | Status: AC
  Administered 2014-03-31: 2 via ORAL
  Filled 2014-03-31: qty 2

## 2014-03-31 MED ORDER — IBUPROFEN 400 MG PO TABS
800.0000 mg | ORAL_TABLET | Freq: Once | ORAL | Status: AC
  Administered 2014-03-31: 800 mg via ORAL
  Filled 2014-03-31: qty 2

## 2014-03-31 MED ORDER — METHOCARBAMOL 500 MG PO TABS: 500.0000 mg | ORAL_TABLET | Freq: Two times a day (BID) | ORAL | Status: DC
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
## 2014-04-05 ENCOUNTER — Ambulatory Visit: Payer: No Typology Code available for payment source | Admitting: Family Medicine

## 2014-04-06 NOTE — ED Provider Notes (Signed)
Medical screening examination/treatment/procedure(s) were performed by non-physician practitioner and as supervising physician I was immediately available for consultation/collaboration.   EKG Interpretation None       Oisin Yoakum K Linker, MD 04/06/14 0717 

## 2014-04-12 ENCOUNTER — Encounter (HOSPITAL_COMMUNITY): Payer: Self-pay | Admitting: Emergency Medicine

## 2014-04-12 ENCOUNTER — Emergency Department (INDEPENDENT_AMBULATORY_CARE_PROVIDER_SITE_OTHER)
Admission: EM | Admit: 2014-04-12 | Discharge: 2014-04-12 | Disposition: A | Discharge status: Home / Self Care | Payer: No Typology Code available for payment source | Source: Home / Self Care | Attending: Family Medicine | Admitting: Family Medicine

## 2014-04-12 ENCOUNTER — Emergency Department (INDEPENDENT_AMBULATORY_CARE_PROVIDER_SITE_OTHER): Payer: No Typology Code available for payment source

## 2014-04-12 DIAGNOSIS — S40019A Contusion of unspecified shoulder, initial encounter: Secondary | ICD-10-CM

## 2014-04-12 DIAGNOSIS — S139XXA Sprain of joints and ligaments of unspecified parts of neck, initial encounter: Secondary | ICD-10-CM

## 2014-04-12 DIAGNOSIS — S161XXA Strain of muscle, fascia and tendon at neck level, initial encounter: Secondary | ICD-10-CM

## 2014-04-12 DIAGNOSIS — Y92410 Unspecified street and highway as the place of occurrence of the external cause: Secondary | ICD-10-CM

## 2014-04-12 DIAGNOSIS — S40012A Contusion of left shoulder, initial encounter: Secondary | ICD-10-CM

## 2014-04-12 MED ORDER — KETOROLAC TROMETHAMINE 30 MG/ML IJ SOLN
60.0000 mg | Freq: Once | INTRAMUSCULAR | Status: AC
  Administered 2014-04-12: 60 mg via INTRAMUSCULAR

## 2014-04-12 MED ORDER — KETOROLAC TROMETHAMINE 60 MG/2ML IM SOLN
INTRAMUSCULAR | Status: AC
  Filled 2014-04-12: qty 2

## 2014-04-12 MED ORDER — METHYLPREDNISOLONE (PAK) 4 MG PO TABS: ORAL_TABLET | ORAL | Status: DC

## 2014-04-12 NOTE — Discharge Instructions (Signed)
While your xrays were without evidence of acute injury, it would appear that you are still quite uncomfortable. Please begin taking new medication as prescribed and I would like you to call [facility] tomorrow for a follow up appointment. They may wish to refer you for an MRI of your neck to try to better understand why you are still so uncomfortable.  Cervical Sprain A cervical sprain is an injury in the neck in which the strong, fibrous tissues (ligaments) that connect your neck bones stretch or tear. Cervical sprains can range from mild to severe. Severe cervical sprains can cause the neck vertebrae to be unstable. This can lead to damage of the spinal cord and can result in serious nervous system problems. The amount of time it takes for a cervical sprain to get better depends on the cause and extent of the injury. Most cervical sprains heal in 1 to 3 weeks. CAUSES  Severe cervical sprains may be caused by:   Contact sport injuries (such as from football, rugby, wrestling, hockey, auto racing, gymnastics, diving, martial arts, or boxing).   Motor vehicle collisions.   Whiplash injuries. This is an injury from a sudden forward-and backward whipping movement of the head and neck.  Falls.  Mild cervical sprains may be caused by:   Being in an awkward position, such as while cradling a telephone between your ear and shoulder.   Sitting in a chair that does not offer proper support.   Working at a poorly Landscape architect station.   Looking up or down for long periods of time.  SYMPTOMS   Pain, soreness, stiffness, or a burning sensation in the front, back, or sides of the neck. This discomfort may develop immediately after the injury or slowly, 24 hours or more after the injury.   Pain or tenderness directly in the middle of the back of the neck.   Shoulder or upper back pain.   Limited ability to move the neck.   Headache.   Dizziness.   Weakness,  numbness, or tingling in the hands or arms.   Muscle spasms.   Difficulty swallowing or chewing.   Tenderness and swelling of the neck.  DIAGNOSIS  Most of the time your health care provider can diagnose a cervical sprain by taking your history and doing a physical exam. Your health care provider will ask about previous neck injuries and any known neck problems, such as arthritis in the neck. X-rays may be taken to find out if there are any other problems, such as with the bones of the neck. Other tests, such as a CT scan or MRI, may also be needed.  TREATMENT  Treatment depends on the severity of the cervical sprain. Mild sprains can be treated with rest, keeping the neck in place (immobilization), and pain medicines. Severe cervical sprains are immediately immobilized. Further treatment is done to help with pain, muscle spasms, and other symptoms and may include:  Medicines, such as pain relievers, numbing medicines, or muscle relaxants.   Physical therapy. This may involve stretching exercises, strengthening exercises, and posture training. Exercises and improved posture can help stabilize the neck, strengthen muscles, and help stop symptoms from returning.  HOME CARE INSTRUCTIONS   Put ice on the injured area.   Put ice in a plastic bag.   Place a towel between your skin and the bag.   Leave the ice on for 15 20 minutes, 3 4 times a day.   If your injury was  severe, you may have been given a cervical collar to wear. A cervical collar is a two-piece collar designed to keep your neck from moving while it heals.  Do not remove the collar unless instructed by your health care provider.  If you have long hair, keep it outside of the collar.  Ask your health care provider before making any adjustments to your collar. Minor adjustments may be required over time to improve comfort and reduce pressure on your chin or on the back of your head.  Ifyou are allowed to remove the  collar for cleaning or bathing, follow your health care provider's instructions on how to do so safely.  Keep your collar clean by wiping it with mild soap and water and drying it completely. If the collar you have been given includes removable pads, remove them every 1 2 days and hand wash them with soap and water. Allow them to air dry. They should be completely dry before you wear them in the collar.  If you are allowed to remove the collar for cleaning and bathing, wash and dry the skin of your neck. Check your skin for irritation or sores. If you see any, tell your health care provider.  Do not drive while wearing the collar.   Only take over-the-counter or prescription medicines for pain, discomfort, or fever as directed by your health care provider.   Keep all follow-up appointments as directed by your health care provider.   Keep all physical therapy appointments as directed by your health care provider.   Make any needed adjustments to your workstation to promote good posture.   Avoid positions and activities that make your symptoms worse.   Warm up and stretch before being active to help prevent problems.  SEEK MEDICAL CARE IF:   Your pain is not controlled with medicine.   You are unable to decrease your pain medicine over time as planned.   Your activity level is not improving as expected.  SEEK IMMEDIATE MEDICAL CARE IF:   You develop any bleeding.  You develop stomach upset.  You have signs of an allergic reaction to your medicine.   Your symptoms get worse.   You develop new, unexplained symptoms.   You have numbness, tingling, weakness, or paralysis in any part of your body.  MAKE SURE YOU:   Understand these instructions.  Will watch your condition.  Will get help right away if you are not doing well or get worse. Document Released: 09/16/2007 Document Revised: 09/09/2013 Document Reviewed: 05/27/2013 [facility] Patient Information 2014  [place].  Contusion A contusion is a deep bruise. Contusions are the result of an injury that caused bleeding under the skin. The contusion may turn blue, purple, or yellow. Minor injuries will give you a painless contusion, but more severe contusions may stay painful and swollen for a few weeks.  CAUSES  A contusion is usually caused by a blow, trauma, or direct force to an area of the body. SYMPTOMS   Swelling and redness of the injured area.  Bruising of the injured area.  Tenderness and soreness of the injured area.  Pain. DIAGNOSIS  The diagnosis can be made by taking a history and physical exam. An X-ray, CT scan, or MRI may be needed to determine if there were any associated injuries, such as fractures. TREATMENT  Specific treatment will depend on what area of the body was injured. In general, the best treatment for a contusion is resting, icing, elevating, and applying cold  compresses to the injured area. Over-the-counter medicines may also be recommended for pain control. Ask your caregiver what the best treatment is for your contusion. HOME CARE INSTRUCTIONS   Put ice on the injured area.  Put ice in a plastic bag.  Place a towel between your skin and the bag.  Leave the ice on for 15-20 minutes, 03-04 times a day.  Only take over-the-counter or prescription medicines for pain, discomfort, or fever as directed by your caregiver. Your caregiver may recommend avoiding anti-inflammatory medicines (aspirin, ibuprofen, and naproxen) for 48 hours because these medicines may increase bruising.  Rest the injured area.  If possible, elevate the injured area to reduce swelling. SEEK IMMEDIATE MEDICAL CARE IF:   You have increased bruising or swelling.  You have pain that is getting worse.  Your swelling or pain is not relieved with medicines. MAKE SURE YOU:   Understand these instructions.  Will watch your condition.  Will get help right away if you are not doing  well or get worse. Document Released: 08/29/2005 Document Revised: 02/11/2012 Document Reviewed: 09/24/2011 [facility] Patient Information 2014 [place], [place].
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
## 2014-04-15 NOTE — ED Provider Notes (Signed)
Medical screening examination/treatment/procedure(s) were performed by a resident physician or non-physician practitioner and as the supervising physician I was immediately available for consultation/collaboration.  Lynne Leader, MD    Gregor Hams, MD 04/15/14 [phone]

## 2014-05-04 ENCOUNTER — Ambulatory Visit (INDEPENDENT_AMBULATORY_CARE_PROVIDER_SITE_OTHER): Payer: No Typology Code available for payment source | Admitting: Family Medicine

## 2014-05-04 ENCOUNTER — Encounter: Payer: Self-pay | Admitting: Family Medicine

## 2014-05-04 VITALS — BP 132/80 | HR 67 | Ht 64.0 in | Wt 300.0 lb

## 2014-05-04 DIAGNOSIS — IMO0002 Reserved for concepts with insufficient information to code with codable children: Secondary | ICD-10-CM

## 2014-05-04 DIAGNOSIS — I10 Essential (primary) hypertension: Secondary | ICD-10-CM

## 2014-05-04 DIAGNOSIS — IMO0001 Reserved for inherently not codable concepts without codable children: Secondary | ICD-10-CM

## 2014-05-04 DIAGNOSIS — E1165 Type 2 diabetes mellitus with hyperglycemia: Secondary | ICD-10-CM

## 2014-05-04 LAB — POCT GLYCOSYLATED HEMOGLOBIN (HGB A1C): Hemoglobin A1C: 9

## 2014-05-04 MED ORDER — INSULIN NPH ISOPHANE & REGULAR (70-30) 100 UNIT/ML ~~LOC~~ SUSP: SUBCUTANEOUS | Status: DC

## 2014-05-04 MED ORDER — "NEEDLE (DISP) 19G X 1"" MISC": Status: DC

## 2014-05-04 MED ORDER — INSULIN SYRINGES (DISPOSABLE) U-100 1 ML MISC: Status: DC
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
## 2014-05-05 NOTE — Assessment & Plan Note (Signed)
Blood pressure initially elevated however improved by end of visit. -continue current dose of Lisinopril

## 2014-05-05 NOTE — Assessment & Plan Note (Signed)
Uncontrolled diabetes due to not taking insulin (not able to afford due to change in insurance) -After a discussion with the pharmacy team at [facility] we elected to transition patient to Novolin 70/30 80 units in the AM and 40 units in the PM -Patient to schedule follow up with PCP in the next 1-2 weeks -If patient still unable to afford Insulin may need to apply directly to drug companies for patient assistance

## 2014-05-05 NOTE — Assessment & Plan Note (Signed)
>>  ASSESSMENT AND PLAN FOR DM (DIABETES MELLITUS), TYPE 2, UNCONTROLLED WRITTEN ON 05/05/2014  2:55 PM BY FLETKE, Ronnell Freshwater, MD  Uncontrolled diabetes due to not taking insulin (not able to afford due to change in insurance) -After a discussion with the pharmacy team at [facility] we elected to transition patient to Novolin 70/30 80 units in the AM and 40 units in the PM -Patient to schedule follow up with PCP in the next 1-2 weeks -If patient still unable to afford Insulin may need to apply directly to drug companies for patient assistance
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
## 2014-05-07 ENCOUNTER — Encounter: Payer: Self-pay | Admitting: Family Medicine

## 2014-05-07 ENCOUNTER — Ambulatory Visit (INDEPENDENT_AMBULATORY_CARE_PROVIDER_SITE_OTHER): Payer: No Typology Code available for payment source | Admitting: Family Medicine

## 2014-05-07 VITALS — BP 130/100 | HR 101 | Temp 98.1°F | Ht 64.0 in | Wt 298.3 lb

## 2014-05-07 DIAGNOSIS — R209 Unspecified disturbances of skin sensation: Secondary | ICD-10-CM

## 2014-05-07 DIAGNOSIS — M25512 Pain in left shoulder: Secondary | ICD-10-CM

## 2014-05-07 DIAGNOSIS — R200 Anesthesia of skin: Secondary | ICD-10-CM

## 2014-05-07 DIAGNOSIS — M5410 Radiculopathy, site unspecified: Secondary | ICD-10-CM

## 2014-05-07 DIAGNOSIS — M6281 Muscle weakness (generalized): Secondary | ICD-10-CM

## 2014-05-07 DIAGNOSIS — M5412 Radiculopathy, cervical region: Secondary | ICD-10-CM

## 2014-05-07 DIAGNOSIS — M25519 Pain in unspecified shoulder: Secondary | ICD-10-CM

## 2014-05-07 MED ORDER — GABAPENTIN 300 MG PO CAPS: 300.0000 mg | ORAL_CAPSULE | Freq: Three times a day (TID) | ORAL | Status: DC

## 2014-05-07 NOTE — Patient Instructions (Addendum)
Dear Ms. Krista Adams,   Thank you for coming to clinic today. Please read below regarding the issues that we discussed.   1. MRI - we will get this approved and then contact you.   2. Pain - I sent a prescription to the pharmacy for neurotin. Take one tablet at night and the work your way up to 3 per day as needed.   Please follow up in clinic after your MRI is completed. Please call earlier if you have any questions or concerns.   Sincerely,   Dr. Maricela Bo
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
## 2014-05-09 DIAGNOSIS — R200 Anesthesia of skin: Secondary | ICD-10-CM | POA: Insufficient documentation

## 2014-05-09 DIAGNOSIS — M25512 Pain in left shoulder: Secondary | ICD-10-CM | POA: Insufficient documentation

## 2014-05-09 NOTE — Assessment & Plan Note (Signed)
A: Pt with tenderness very out of proportion to muscle strength and ROM P: may be related to radiculopathy; start neurontin 300 mg QHS and titrated up as tolerated

## 2014-05-09 NOTE — Assessment & Plan Note (Signed)
It does not fit a single cervical distribution, but the weakness of the arm is concerning. I will order a cervical spine MRI for further evaluation.

## 2014-05-17 ENCOUNTER — Telehealth: Payer: Self-pay

## 2014-05-17 NOTE — Telephone Encounter (Signed)
Received prior authorization from insurance company today.  It is valid for 30 days.  Called pt to see her preferred time and found out she is severely claustrophobic.  She will need medication prior to procedure.  She informed me that she will be out of town for the next few weeks and she will call me back to schedule.  I am going to schedule her at [place] because they have an open MRI. Shelly

## 2014-05-24 ENCOUNTER — Telehealth: Payer: Self-pay | Admitting: Family Medicine

## 2014-05-26 NOTE — Telephone Encounter (Signed)
Called Pt about DM care. When Pt calls back, please schedule appointment to check LDL and BP.

## 2014-05-28 ENCOUNTER — Other Ambulatory Visit: Payer: No Typology Code available for payment source
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
## 2014-06-02 ENCOUNTER — Other Ambulatory Visit: Payer: Self-pay | Admitting: Family Medicine

## 2014-06-03 ENCOUNTER — Telehealth: Payer: Self-pay | Admitting: Family Medicine

## 2014-06-03 NOTE — Telephone Encounter (Signed)
To [facility] red team - please call pt and let her know I have refilled her medicine but she needs to schedule an appointment to follow up on her blood pressure. Thanks!  Leeanne Rio, MD

## 2014-06-07 ENCOUNTER — Telehealth: Payer: Self-pay | Admitting: *Deleted

## 2014-06-07 NOTE — Telephone Encounter (Signed)
Called and informed patient of appointment for MRI cervical spine on 06/14/14 at 8:30 pm at [facility], [place].Korey Arroyo, Kevin Fenton

## 2014-06-07 NOTE — Telephone Encounter (Signed)
Left message to return call. Read message from Dr Ardelia Mems.Claryce Friel, Kevin Fenton

## 2014-06-14 ENCOUNTER — Inpatient Hospital Stay: Admission: RE | Admit: 2014-06-14 | Payer: No Typology Code available for payment source | Source: Ambulatory Visit

## 2014-07-08 ENCOUNTER — Inpatient Hospital Stay (HOSPITAL_COMMUNITY)
Admission: AD | Admit: 2014-07-08 | Discharge: 2014-07-08 | Disposition: A | Discharge status: Home / Self Care | Payer: No Typology Code available for payment source | Source: Ambulatory Visit | Attending: Obstetrics & Gynecology | Admitting: Obstetrics & Gynecology

## 2014-07-08 ENCOUNTER — Inpatient Hospital Stay (HOSPITAL_COMMUNITY): Payer: No Typology Code available for payment source

## 2014-07-08 ENCOUNTER — Encounter (HOSPITAL_COMMUNITY): Payer: Self-pay | Admitting: *Deleted

## 2014-07-08 DIAGNOSIS — N8500 Endometrial hyperplasia, unspecified: Secondary | ICD-10-CM | POA: Insufficient documentation

## 2014-07-08 DIAGNOSIS — K6289 Other specified diseases of anus and rectum: Secondary | ICD-10-CM | POA: Insufficient documentation

## 2014-07-08 DIAGNOSIS — N926 Irregular menstruation, unspecified: Secondary | ICD-10-CM | POA: Insufficient documentation

## 2014-07-08 DIAGNOSIS — N938 Other specified abnormal uterine and vaginal bleeding: Secondary | ICD-10-CM | POA: Insufficient documentation

## 2014-07-08 DIAGNOSIS — R109 Unspecified abdominal pain: Secondary | ICD-10-CM | POA: Insufficient documentation

## 2014-07-08 DIAGNOSIS — N925 Other specified irregular menstruation: Secondary | ICD-10-CM | POA: Insufficient documentation

## 2014-07-08 DIAGNOSIS — N949 Unspecified condition associated with female genital organs and menstrual cycle: Secondary | ICD-10-CM | POA: Insufficient documentation

## 2014-07-08 DIAGNOSIS — F17200 Nicotine dependence, unspecified, uncomplicated: Secondary | ICD-10-CM | POA: Insufficient documentation

## 2014-07-08 DIAGNOSIS — I10 Essential (primary) hypertension: Secondary | ICD-10-CM | POA: Insufficient documentation

## 2014-07-08 DIAGNOSIS — E119 Type 2 diabetes mellitus without complications: Secondary | ICD-10-CM | POA: Insufficient documentation

## 2014-07-08 HISTORY — DX: Anxiety disorder, unspecified: F41.9

## 2014-07-08 HISTORY — DX: Major depressive disorder, single episode, unspecified: F32.9

## 2014-07-08 HISTORY — DX: Type 2 diabetes mellitus without complications: E11.9

## 2014-07-08 HISTORY — DX: Depression, unspecified: F32.A

## 2014-07-08 LAB — URINALYSIS, ROUTINE W REFLEX MICROSCOPIC
Bilirubin Urine: NEGATIVE
Glucose, UA: NEGATIVE mg/dL
Ketones, ur: 15 mg/dL — AB
Leukocytes, UA: NEGATIVE
Nitrite: NEGATIVE
Protein, ur: NEGATIVE mg/dL
Specific Gravity, Urine: 1.015 (ref 1.005–1.030)
Urobilinogen, UA: 1 mg/dL (ref 0.0–1.0)
pH: 7 (ref 5.0–8.0)

## 2014-07-08 LAB — COMPREHENSIVE METABOLIC PANEL
ALT: <5 U/L (ref 0–35)
AST: 12 U/L (ref 0–37)
Albumin: 3.7 g/dL (ref 3.5–5.2)
Alkaline Phosphatase: 78 U/L (ref 39–117)
Anion gap: 13 (ref 5–15)
BUN: 9 mg/dL (ref 6–23)
CO2: 23 mEq/L (ref 19–32)
Calcium: 9.1 mg/dL (ref 8.4–10.5)
Chloride: 103 mEq/L (ref 96–112)
Creatinine, Ser: 0.66 mg/dL (ref 0.50–1.10)
GFR calc Af Amer: >90 mL/min (ref >90)
GFR calc non Af Amer: >90 mL/min (ref >90)
Glucose, Bld: 220 mg/dL — ABNORMAL HIGH (ref 70–99)
Potassium: 4.7 mEq/L (ref 3.7–5.3)
Sodium: 139 mEq/L (ref 137–147)
Total Bilirubin: 0.5 mg/dL (ref 0.3–1.2)
Total Protein: 7.4 g/dL (ref 6.0–8.3)

## 2014-07-08 LAB — CBC
HCT: 41.6 % (ref 36.0–46.0)
Hemoglobin: 13.5 g/dL (ref 12.0–15.0)
MCH: 28.5 pg (ref 26.0–34.0)
MCHC: 32.5 g/dL (ref 30.0–36.0)
MCV: 87.8 fL (ref 78.0–100.0)
Platelets: 245 10*3/uL (ref 150–400)
RBC: 4.74 MIL/uL (ref 3.87–5.11)
RDW: 13.9 % (ref 11.5–15.5)
WBC: 8.5 10*3/uL (ref 4.0–10.5)

## 2014-07-08 LAB — WET PREP, GENITAL
Clue Cells Wet Prep HPF POC: NONE SEEN
Trich, Wet Prep: NONE SEEN
Yeast Wet Prep HPF POC: NONE SEEN

## 2014-07-08 LAB — POCT PREGNANCY, URINE: Preg Test, Ur: NEGATIVE

## 2014-07-08 LAB — URINE MICROSCOPIC-ADD ON

## 2014-07-08 MED ORDER — OXYCODONE-ACETAMINOPHEN 5-325 MG PO TABS: 1.0000 | ORAL_TABLET | ORAL | Status: DC | PRN

## 2014-07-08 MED ORDER — IOHEXOL 300 MG/ML  SOLN
50.0000 mL | INTRAMUSCULAR | Status: DC
  Administered 2014-07-08: 50 mL via ORAL

## 2014-07-08 MED ORDER — ONDANSETRON 8 MG PO TBDP
8.0000 mg | ORAL_TABLET | Freq: Once | ORAL | Status: AC
  Administered 2014-07-08: 8 mg via ORAL
  Filled 2014-07-08: qty 1

## 2014-07-08 MED ORDER — IOHEXOL 300 MG/ML  SOLN
100.0000 mL | Freq: Once | INTRAMUSCULAR | Status: AC | PRN
  Administered 2014-07-08: 100 mL via INTRAVENOUS

## 2014-07-08 MED ORDER — OXYCODONE-ACETAMINOPHEN 5-325 MG PO TABS
2.0000 | ORAL_TABLET | Freq: Once | ORAL | Status: AC
  Administered 2014-07-08: 2 via ORAL
  Filled 2014-07-08: qty 2

## 2014-07-08 MED ORDER — KETOROLAC TROMETHAMINE 60 MG/2ML IM SOLN
60.0000 mg | Freq: Once | INTRAMUSCULAR | Status: AC
  Administered 2014-07-08: 60 mg via INTRAMUSCULAR
  Filled 2014-07-08: qty 2

## 2014-07-08 NOTE — Discharge Instructions (Signed)
Endometrial Biopsy Endometrial biopsy is a procedure in which a tissue sample is taken from inside the uterus. The tissue sample is then looked at under a microscope to see if the tissue is normal or abnormal. The endometrium is the lining of the uterus. This procedure helps determine where you are in your menstrual cycle and how hormone levels are affecting the lining of the uterus. This procedure may also be used to evaluate uterine bleeding or to diagnose endometrial cancer, tuberculosis, polyps, or inflammatory conditions.  LET YOUR HEALTH CARE PROVIDER KNOW ABOUT:  Any allergies you have.  All medicines you are taking, including vitamins, herbs, eye drops, creams, and over-the-counter medicines.  Previous problems you or members of your family have had with the use of anesthetics.  Any blood disorders you have.  Previous surgeries you have had.  Medical conditions you have.  Possibility of pregnancy. RISKS AND COMPLICATIONS Generally, this is a safe procedure. However, as with any procedure, complications can occur. Possible complications include:  Bleeding.  Pelvic infection.  Puncture of the uterine wall with the biopsy device (rare). BEFORE THE PROCEDURE   Keep a record of your menstrual cycles as directed by your health care provider. You may need to schedule your procedure for a specific time in your cycle.  You may want to bring a sanitary pad to wear home after the procedure.  Arrange for someone to drive you home after the procedure if you will be given a medicine to help you relax (sedative). PROCEDURE   You may be given a sedative to relax you.  You will lie on an exam table with your feet and legs supported as in a pelvic exam.  Your health care provider will insert an instrument (speculum) into your vagina to see your cervix.  Your cervix will be cleansed with an antiseptic solution. A medicine (local anesthetic) will be used to numb the cervix.  A forceps  instrument (tenaculum) will be used to hold your cervix steady for the biopsy.  A thin, rodlike instrument (uterine sound) will be inserted through your cervix to determine the length of your uterus and the location where the biopsy sample will be removed.  A thin, flexible tube (catheter) will be inserted through your cervix and into the uterus. The catheter is used to collect the biopsy sample from your endometrial tissue.  The catheter and speculum will then be removed, and the tissue sample will be sent to a lab for examination. AFTER THE PROCEDURE  You will rest in a recovery area until you are ready to go home.  You may have mild cramping and a small amount of vaginal bleeding for a few days after the procedure. This is normal.  Make sure you find out how to get your test results. Document Released: 03/22/2005 Document Revised: 07/22/2013 Document Reviewed: 05/06/2013 ExitCare Patient Information 2015 [employer]. This information is not intended to replace advice given to you by your health care provider. Make sure you discuss any questions you have with your health care provider.  

## 2014-07-08 NOTE — MAU Note (Signed)
Pt refused blood to be drawn.Pt states she is in too much pain

## 2014-07-08 NOTE — MAU Provider Note (Signed)
History     CSN: [id number]  Arrival date and time: 07/08/14 1631   First Provider Initiated Contact with Patient 07/08/14 1730      Chief Complaint  Patient presents with  . Abdominal Pain  . Vaginal Bleeding   Patient is a 28 y.o. female presenting with abdominal pain and vaginal bleeding.  Abdominal Pain The primary symptoms of the illness include abdominal pain and nausea. The primary symptoms of the illness do not include fever, vomiting or diarrhea.  Additional symptoms associated with the illness include chills. Symptoms associated with the illness do not include constipation.  Vaginal Bleeding Associated symptoms include abdominal pain, chills and nausea. Pertinent negatives include no fever or vomiting.    Krista Adams 28 y.o. pt is not pregnant G0P0 and  presents to MAU for evaluation of 10/10 intermittent cramping pelvic pain since 07/01/14 that has worsened. Pt reports has never had pain this bad. Patient's last menstrual period was 07/01/2014. Pain radiates to her "lower butt." Pt reports a clear discharge, nausea and chills. Denies itching, urinary symptoms, vomiting, diarrhea or constipation. Reports has had chlamydia during the last year. No new partners, has been with current partner for 13 years. Is currently not using any contraception, is trying to get pregnant.  Pt took Tramadol at 8 am and did not help.   Pt is diabetic and hypertensive sees [facility] Pt states that she has had irregular periods.  Her LMP was 07/01/2014 Pt previously has seen [facility] Pt had sex several weeks ago before the pain started REview of chart noted that pt had been here 10/2013 for abnormal bleeding and was prescribed Megace Ultrasound at that point also was difficult visualization but noted endometrial thickness 69mm Pt states she has not had a follow up    Past Medical History  Diagnosis Date  . Essential hypertension, benign 05/06/2008    Qualifier: Diagnosis of  By:  Jamal Collin MD, Ovid Curd    . Morbid obesity 05/06/2008    Qualifier: Diagnosis of  By: Jamal Collin MD, Ovid Curd    . DM (diabetes mellitus), type 2, uncontrolled 05/06/2008    Qualifier: Diagnosis of  By: Drue Flirt  MD, Merrily Brittle    . Anovulatory (dysfunctional uterine) bleeding 03/13/2012  . Depression with anxiety 03/18/2012  . Diabetes mellitus without complication   . Depression   . Anxiety     History reviewed. No pertinent past surgical history.  Family History  Problem Relation Age of Onset  . Diabetes Mother   . Diabetes Maternal Aunt   . Diabetes Maternal Grandmother     History  Substance Use Topics  . Smoking status: Current Every Day Smoker -- 1.00 packs/day    Types: Cigars  . Smokeless tobacco: Not on file  . Alcohol Use: No    Allergies: No Known Allergies  Prescriptions prior to admission  Medication Sig Dispense Refill  . insulin NPH-regular Human (NOVOLIN 70/30) (70-30) 100 UNIT/ML injection Inject 80 units in the morning and 40 units before dinner.  5 vial  1  . lisinopril (PRINIVIL,ZESTRIL) 10 MG tablet TAKE ONE TABLET BY MOUTH ONCE DAILY  30 tablet  0  . megestrol (MEGACE) 40 MG tablet Take 40 mg by mouth daily.      . Blood Glucose Monitoring Suppl W/DEVICE KIT Check blood sugar 4 times daily.  1 each  0  . Insulin Syringes, Disposable, U-100 1 ML MISC Inject 80 units of insulin 70/30 in the AM and 40 units at night.  100 each  1  . NEEDLE, DISP, 19 G (BD DISP NEEDLES) 19G X 1" MISC Use to inject insulin as prescribed.  100 each  1    Review of Systems  Constitutional: Positive for chills and malaise/fatigue. Negative for fever.  HENT: Negative.   Eyes: Negative.   Respiratory: Negative.   Cardiovascular: Negative.   Gastrointestinal: Positive for nausea and abdominal pain. Negative for vomiting, diarrhea and constipation.  Genitourinary: Negative.   Neurological: Negative.    Physical Exam   Blood pressure 178/99, pulse 97, temperature 98.6 F (37 C),  temperature source Oral, resp. rate 20, height $RemoveBe'5\' 4"'pnSnMhanG$  (1.626 m), weight 297 lb 9.6 oz (134.99 kg), last menstrual period 07/01/2014, SpO2 100.00%.  Physical Exam  Constitutional: She is oriented to person, place, and time. She appears well-developed and well-nourished.  HENT:  Head: Normocephalic.  Neck: Normal range of motion.  Cardiovascular: Normal rate, regular rhythm and normal heart sounds.   Respiratory: Effort normal and breath sounds normal.  GI: Soft. She exhibits no distension. There is tenderness in the right lower quadrant, suprapubic area and left lower quadrant. There is guarding. There is no rebound.  Genitourinary:  Mod amount of bright red blood with tissue appearing matter from cervix- cervix closed; uterus unable to outline due to habitus- no CMT; no rebound  Musculoskeletal: Normal range of motion.  Neurological: She is alert and oriented to person, place, and time.  Skin: Skin is warm and dry.  Psychiatric: Her mood appears anxious.    MAU Course  Procedures Results for orders placed during the hospital encounter of 07/08/14 (from the past 24 hour(s))  POCT PREGNANCY, URINE     Status: None   Collection Time    07/08/14  5:29 PM      Result Value Ref Range   Preg Test, Ur NEGATIVE  NEGATIVE  pt writhing in the bed- lab unable to draw blood until pt stops writhing Toradal 60 IM given Pt c/o nausea- zofran 8 mg ODT and Percocet 2 tabs given US Transvaginal Non-ob  07/08/2014   CLINICAL DATA:  Pelvic pain and bleeding.  Irregular menses.  EXAM: TRANSABDOMINAL AND TRANSVAGINAL ULTRASOUND OF PELVIS  TECHNIQUE: Both transabdominal and transvaginal ultrasound examinations of the pelvis were performed. Transabdominal technique was performed for global imaging of the pelvis including uterus, ovaries, adnexal regions, and pelvic cul-de-sac. It was necessary to proceed with endovaginal exam following the transabdominal exam to visualize the ovaries and uterus.  COMPARISON:   Ultrasound 10/10/2013  FINDINGS: Uterus  Measurements: 8 x 4 x 3.6 cm. Uterus is not well visualized due to patient size. No fibroids or other mass visualized.  Endometrium  Thickness: .  Endometrium not visualized.  Right ovary  Measurements: . Not visualized  Left ovary  Measurements: . Not visualized  Other findings  No free fluid.  IMPRESSION: Exam quality is markedly limited by obesity. Nose cyst or mass or free fluid identified.   Electronically Signed   By: Franchot Gallo M.D.   On: 07/08/2014 20:38   US Pelvis Complete  07/08/2014   CLINICAL DATA:  Pelvic pain and bleeding.  Irregular menses.  EXAM: TRANSABDOMINAL AND TRANSVAGINAL ULTRASOUND OF PELVIS  TECHNIQUE: Both transabdominal and transvaginal ultrasound examinations of the pelvis were performed. Transabdominal technique was performed for global imaging of the pelvis including uterus, ovaries, adnexal regions, and pelvic cul-de-sac. It was necessary to proceed with endovaginal exam following the transabdominal exam to visualize the ovaries and uterus.  COMPARISON:  Ultrasound  10/10/2013  FINDINGS: Uterus  Measurements: 8 x 4 x 3.6 cm. Uterus is not well visualized due to patient size. No fibroids or other mass visualized.  Endometrium  Thickness: .  Endometrium not visualized.  Right ovary  Measurements: . Not visualized  Left ovary  Measurements: . Not visualized  Other findings  No free fluid.  IMPRESSION: Exam quality is markedly limited by obesity. Nose cyst or mass or free fluid identified.   Electronically Signed   By: Franchot Gallo M.D.   On: 07/08/2014 20:38  will do CT due to pt's continued pain and inadequate ultrasound Pt states she feels better now - after pain med- than she has felt all day. Care turned over to [facility], CNM US Transvaginal Non-ob  07/08/2014   CLINICAL DATA:  Pelvic pain and bleeding.  Irregular menses.  EXAM: TRANSABDOMINAL AND TRANSVAGINAL ULTRASOUND OF PELVIS  TECHNIQUE: Both transabdominal and transvaginal  ultrasound examinations of the pelvis were performed. Transabdominal technique was performed for global imaging of the pelvis including uterus, ovaries, adnexal regions, and pelvic cul-de-sac. It was necessary to proceed with endovaginal exam following the transabdominal exam to visualize the ovaries and uterus.  COMPARISON:  Ultrasound 10/10/2013  FINDINGS: Uterus  Measurements: 8 x 4 x 3.6 cm. Uterus is not well visualized due to patient size. No fibroids or other mass visualized.  Endometrium  Thickness: .  Endometrium not visualized.  Right ovary  Measurements: . Not visualized  Left ovary  Measurements: . Not visualized  Other findings  No free fluid.  IMPRESSION: Exam quality is markedly limited by obesity. Nose cyst or mass or free fluid identified.   Electronically Signed   By: Franchot Gallo M.D.   On: 07/08/2014 20:38   US Pelvis Complete  07/08/2014   CLINICAL DATA:  Pelvic pain and bleeding.  Irregular menses.  EXAM: TRANSABDOMINAL AND TRANSVAGINAL ULTRASOUND OF PELVIS  TECHNIQUE: Both transabdominal and transvaginal ultrasound examinations of the pelvis were performed. Transabdominal technique was performed for global imaging of the pelvis including uterus, ovaries, adnexal regions, and pelvic cul-de-sac. It was necessary to proceed with endovaginal exam following the transabdominal exam to visualize the ovaries and uterus.  COMPARISON:  Ultrasound 10/10/2013  FINDINGS: Uterus  Measurements: 8 x 4 x 3.6 cm. Uterus is not well visualized due to patient size. No fibroids or other mass visualized.  Endometrium  Thickness: .  Endometrium not visualized.  Right ovary  Measurements: . Not visualized  Left ovary  Measurements: . Not visualized  Other findings  No free fluid.  IMPRESSION: Exam quality is markedly limited by obesity. Nose cyst or mass or free fluid identified.   Electronically Signed   By: Franchot Gallo M.D.   On: 07/08/2014 20:38   Ct Abdomen Pelvis W Contrast  07/08/2014   CLINICAL DATA:   Vaginal bleeding and lower pelvic pain for 1 week  EXAM: CT ABDOMEN AND PELVIS WITH CONTRAST  TECHNIQUE: Multidetector CT imaging of the abdomen and pelvis was performed using the standard protocol following bolus administration of intravenous contrast.  CONTRAST:  117mL OMNIPAQUE IOHEXOL 300 MG/ML SOLN, 1 OMNIPAQUE IOHEXOL 300 MG/ML SOLN  COMPARISON:  Abdominal pain  FINDINGS: BODY WALL: Unremarkable.  LOWER CHEST: Unremarkable.  ABDOMEN/PELVIS:  Liver: No focal abnormality.  Biliary: No evidence of biliary obstruction or stone.  Pancreas: Unremarkable.  Spleen: Unremarkable.  Adrenals: Unremarkable.  Kidneys and ureters: No hydronephrosis or stone.  Bladder: Unremarkable.  Reproductive: Abnormal endometrial cavity which is thickened by hypoenhancing or  nonenhancing material. Material extends into the endocervical canal with distention up to 2.3 cm. No uterine mass identified. Within the upper aspect of the left ovary is a 6 mm fatty focus. No calcification. No ovarian enlargement.  Bowel: No obstruction. Negative appendix.  Retroperitoneum: No mass or adenopathy.  Peritoneum: No ascites or pneumoperitoneum.  Vascular: No acute abnormality.  OSSEOUS: No acute abnormalities.  IMPRESSION: 1. Abnormal tissue or blood clot distends the endometrial cavity and endocervical canal to 2.3 cm. Given morbid obesity risk factor for endometrial neoplasia, consider sampling. 2. 6 mm fatty structure in the left ovary, likely early dermoid.   Electronically Signed   By: Jorje Guild M.D.   On: 07/08/2014 23:23    Assessment and Plan   1. Endometrial hyperplasia    FU in the clinic for endometrial biopsy  Follow-up Information   Follow up with [facility] In 1 day. (FRIDAY 07/09/14 AT 0900 AM)    Specialty:  Obstetrics and Gynecology   Contact information:   [place] [address] [phone]       Sherolyn Buba 07/08/2014, 5:42 PM

## 2014-07-08 NOTE — MAU Note (Signed)
Called phlebotomy to draw blood, not available at this time.

## 2014-07-08 NOTE — MAU Note (Signed)
Pt states she has her period and it has always been abnormal, irregular. Pt states she has been bleeding for 1 wk. Pt states pain is "really , really bad and I  would not have made it through the night. Pt is tearful and crying.

## 2014-07-08 NOTE — MAU Note (Signed)
Patient states she started her period one week ago and is still bleeding. Has had lower abdominal pain since the period started. Having rectal pain that feels like she need to go to the bathroom.

## 2014-07-09 ENCOUNTER — Encounter: Payer: Self-pay | Admitting: Obstetrics & Gynecology

## 2014-07-09 ENCOUNTER — Other Ambulatory Visit (HOSPITAL_COMMUNITY)
Admission: RE | Admit: 2014-07-09 | Discharge: 2014-07-09 | Disposition: A | Discharge status: Home / Self Care | Payer: No Typology Code available for payment source | Source: Ambulatory Visit | Attending: Obstetrics & Gynecology | Admitting: Obstetrics & Gynecology

## 2014-07-09 ENCOUNTER — Ambulatory Visit (INDEPENDENT_AMBULATORY_CARE_PROVIDER_SITE_OTHER): Payer: No Typology Code available for payment source | Admitting: Obstetrics & Gynecology

## 2014-07-09 VITALS — BP 131/73 | HR 55 | Temp 98.2°F | Ht 64.0 in | Wt 297.3 lb

## 2014-07-09 DIAGNOSIS — N938 Other specified abnormal uterine and vaginal bleeding: Secondary | ICD-10-CM

## 2014-07-09 DIAGNOSIS — N949 Unspecified condition associated with female genital organs and menstrual cycle: Secondary | ICD-10-CM

## 2014-07-09 DIAGNOSIS — N925 Other specified irregular menstruation: Secondary | ICD-10-CM

## 2014-07-09 DIAGNOSIS — D390 Neoplasm of uncertain behavior of uterus: Secondary | ICD-10-CM | POA: Insufficient documentation

## 2014-07-09 LAB — GC/CHLAMYDIA PROBE AMP
CT Probe RNA: NEGATIVE
GC Probe RNA: NEGATIVE

## 2014-07-09 NOTE — Progress Notes (Signed)
Patient ID: Krista Adams, female   DOB: 05-10-1986, 28 y.o.   MRN: [id number] The indications for endometrial biopsy were reviewed.   Risks of the biopsy including cramping, bleeding, infection, uterine perforation, inadequate specimen and need for additional procedures  were discussed. The patient states she understands and agrees to undergo procedure today. Consent was signed. Time out was performed. Urine HCG was negative.  BP 131/73  Pulse 55  Temp(Src) 98.2 F (36.8 C) (Oral)  Ht 5\' 4"  (1.626 m)  Wt 297 lb 4.8 oz (134.854 kg)  BMI 51.01 kg/m2  LMP 07/01/2014   07/08/2014 CLINICAL DATA: Vaginal bleeding and lower pelvic pain for 1 week  EXAM:  CT ABDOMEN AND PELVIS WITH CONTRAST  TECHNIQUE:  Multidetector CT imaging of the abdomen and pelvis was performed  using the standard protocol following bolus administration of  intravenous contrast.  CONTRAST: 161mL OMNIPAQUE IOHEXOL 300 MG/ML SOLN, 1 OMNIPAQUE  IOHEXOL 300 MG/ML SOLN  COMPARISON: Abdominal pain  FINDINGS:  BODY WALL: Unremarkable.  LOWER CHEST: Unremarkable.  ABDOMEN/PELVIS:  Liver: No focal abnormality.  Biliary: No evidence of biliary obstruction or stone.  Pancreas: Unremarkable.  Spleen: Unremarkable.  Adrenals: Unremarkable.  Kidneys and ureters: No hydronephrosis or stone.  Bladder: Unremarkable.  Reproductive: Abnormal endometrial cavity which is thickened by  hypoenhancing or nonenhancing material. Material extends into the  endocervical canal with distention up to 2.3 cm. No uterine mass  identified. Within the upper aspect of the left ovary is a 6 mm  fatty focus. No calcification. No ovarian enlargement.  Bowel: No obstruction. Negative appendix.  Retroperitoneum: No mass or adenopathy.  Peritoneum: No ascites or pneumoperitoneum.  Vascular: No acute abnormality.  OSSEOUS: No acute abnormalities.  IMPRESSION:  1. Abnormal tissue or blood clot distends the endometrial cavity and  endocervical  canal to 2.3 cm. Given morbid obesity risk factor for  endometrial neoplasia, consider sampling.  2. 6 mm fatty structure in the left ovary, likely early dermoid.   A sterile speculum was placed in the patient's vagina and the cervix was prepped with Betadine. Upon placing the speculum a large mass was noted protruding from the os.  This was grasped with Ringed Forceps- it wsa approximately 5cm in length an dcould be an organized clot or POC or large polyp?????  The 3 mm pipelle was introduced into the endometrial cavity without difficulty to a depth of 7cm, and a moderate amount of blood was obtained ?amount of tissue.  Had to abort procedure as the patient was unable to tolerate the full pelvic exam and kept squeezing the speculum out. All tissue was obtained and sent to pathology. The instruments were removed from the patient's vagina. No futher bleeding was noted. The patient tolerated the procedure well. Routine post-procedure instructions were given to the patient. The patient will follow up in 3 months to review the results and for further management.  Megace 40mg  q day.

## 2014-07-12 ENCOUNTER — Telehealth: Payer: Self-pay | Admitting: *Deleted

## 2014-07-12 NOTE — Telephone Encounter (Signed)
Message copied by Sue Lush on Mon Jul 12, 2014  4:53 PM ------      Message from: Lavonia Drafts      Created: Mon Jul 12, 2014  4:06 PM       Please call pt.  Her endo bx was normal.             She should keep taking the Megace and f/u in 3 months.            Thx,            clh-S ------

## 2014-07-12 NOTE — Telephone Encounter (Signed)
Contacted patient, gave results and plan per Dr. Ihor Dow, pt verbalized understanding.

## 2014-07-15 ENCOUNTER — Telehealth: Payer: Self-pay | Admitting: Family Medicine

## 2014-07-15 NOTE — Telephone Encounter (Signed)
Pt is out of her lisinopril. She has an appt scheduled Aug 25.  Could she get enough called in to last her until her appt? Pharmacy is [facility]

## 2014-07-16 MED ORDER — LISINOPRIL 10 MG PO TABS: ORAL_TABLET | ORAL | Status: DC

## 2014-07-16 NOTE — Telephone Encounter (Signed)
Rx sent in. Thanks! Najia Hurlbutt J Shiloh Swopes, MD  

## 2014-07-22 ENCOUNTER — Telehealth: Payer: Self-pay | Admitting: General Practice

## 2014-07-22 DIAGNOSIS — N970 Female infertility associated with anovulation: Secondary | ICD-10-CM

## 2014-07-22 NOTE — Telephone Encounter (Signed)
Patient called and left message stating she was seen here 2 weeks ago and was given medication for her bleeding. She is out of the medication for bleeding and she is still bleeding and would like this medication called back in to her [place]. Called patient stating I am trying to return your phone call and we have messaged the provider letting them know she needs a refill and we will contact her once we hear back from the provider

## 2014-07-23 ENCOUNTER — Encounter: Payer: Self-pay | Admitting: General Practice

## 2014-07-26 MED ORDER — MEGESTROL ACETATE 40 MG PO TABS
40.0000 mg | ORAL_TABLET | Freq: Two times a day (BID) | ORAL | Status: DC
Start: 2014-07-26 — End: 2014-12-13

## 2014-07-26 NOTE — Telephone Encounter (Signed)
Dr.Harraway-Smith reviewed patient's chart and ordered for patient to double megace dose-- start taking 40mg  BID (versus daily)-- and to follow up in 3 months. States patient may need to have endometrial biopsy repeated in the OR if she continues to bleed with increase in megace. Called patient and informed her of dose change, refill and Dr. Vladimir Creeks recommendations. Informed patient I would send front office staff message to schedule 3 month follow up appointment-- advised patient she call if she does not hear of a follow up appointment within th next few weeks. Patient verbalized understanding and gratitude. No questions or concerns. Message sent to admin pool.

## 2014-07-27 ENCOUNTER — Encounter: Payer: Self-pay | Admitting: Family Medicine

## 2014-07-27 ENCOUNTER — Ambulatory Visit (INDEPENDENT_AMBULATORY_CARE_PROVIDER_SITE_OTHER): Payer: No Typology Code available for payment source | Admitting: Family Medicine

## 2014-07-27 ENCOUNTER — Ambulatory Visit: Payer: No Typology Code available for payment source | Admitting: Family Medicine

## 2014-07-27 VITALS — BP 146/76 | HR 71 | Temp 98.1°F | Ht 64.0 in | Wt 297.8 lb

## 2014-07-27 DIAGNOSIS — A084 Viral intestinal infection, unspecified: Secondary | ICD-10-CM

## 2014-07-27 DIAGNOSIS — A088 Other specified intestinal infections: Secondary | ICD-10-CM

## 2014-07-27 DIAGNOSIS — E1165 Type 2 diabetes mellitus with hyperglycemia: Secondary | ICD-10-CM

## 2014-07-27 DIAGNOSIS — IMO0002 Reserved for concepts with insufficient information to code with codable children: Secondary | ICD-10-CM

## 2014-07-27 DIAGNOSIS — I10 Essential (primary) hypertension: Secondary | ICD-10-CM

## 2014-07-27 DIAGNOSIS — IMO0001 Reserved for inherently not codable concepts without codable children: Secondary | ICD-10-CM

## 2014-07-27 MED ORDER — LISINOPRIL 10 MG PO TABS: ORAL_TABLET | ORAL | Status: DC

## 2014-07-27 MED ORDER — ONDANSETRON HCL 4 MG PO TABS: 4.0000 mg | ORAL_TABLET | Freq: Three times a day (TID) | ORAL | Status: DC | PRN

## 2014-07-27 NOTE — Patient Instructions (Signed)
It was great to see you again today!  Call your insurance company to find out if there is a type of insulin that they will cover better. Come back and see me to discuss this within the next month or so, so we can work on getting your diabetes under better control.  Schedule a separate health maintenance visit (routine yearly physical). At this visit we will make sure you are up to date on all your important health maintenance items.   I refilled your lisinopril. Please come in next week for a nurse visit just to have your blood pressure checked since it was a little high today (this might be due to not feeling well).  Be well, Dr. Ardelia Mems    Viral Gastroenteritis Viral gastroenteritis is also known as stomach flu. This condition affects the stomach and intestinal tract. It can cause sudden diarrhea and vomiting. The illness typically lasts 3 to 8 days. Most people develop an immune response that eventually gets rid of the virus. While this natural response develops, the virus can make you quite ill. CAUSES  Many different viruses can cause gastroenteritis, such as rotavirus or noroviruses. You can catch one of these viruses by consuming contaminated food or water. You may also catch a virus by sharing utensils or other personal items with an infected person or by touching a contaminated surface. SYMPTOMS  The most common symptoms are diarrhea and vomiting. These problems can cause a severe loss of body fluids (dehydration) and a body salt (electrolyte) imbalance. Other symptoms may include:  Fever.  Headache.  Fatigue.  Abdominal pain. DIAGNOSIS  Your caregiver can usually diagnose viral gastroenteritis based on your symptoms and a physical exam. A stool sample may also be taken to test for the presence of viruses or other infections. TREATMENT  This illness typically goes away on its own. Treatments are aimed at rehydration. The most serious cases of viral gastroenteritis involve  vomiting so severely that you are not able to keep fluids down. In these cases, fluids must be given through an intravenous line (IV). HOME CARE INSTRUCTIONS   Drink enough fluids to keep your urine clear or pale yellow. Drink small amounts of fluids frequently and increase the amounts as tolerated.  Ask your caregiver for specific rehydration instructions.  Avoid:  Foods high in sugar.  Alcohol.  Carbonated drinks.  Tobacco.  Juice.  Caffeine drinks.  Extremely hot or cold fluids.  Fatty, greasy foods.  Too much intake of anything at one time.  Dairy products until 24 to 48 hours after diarrhea stops.  You may consume probiotics. Probiotics are active cultures of beneficial bacteria. They may lessen the amount and number of diarrheal stools in adults. Probiotics can be found in yogurt with active cultures and in supplements.  Wash your hands well to avoid spreading the virus.  Only take over-the-counter or prescription medicines for pain, discomfort, or fever as directed by your caregiver. Do not give aspirin to children. Antidiarrheal medicines are not recommended.  Ask your caregiver if you should continue to take your regular prescribed and over-the-counter medicines.  Keep all follow-up appointments as directed by your caregiver. SEEK IMMEDIATE MEDICAL CARE IF:   You are unable to keep fluids down.  You do not urinate at least once every 6 to 8 hours.  You develop shortness of breath.  You notice blood in your stool or vomit. This may look like coffee grounds.  You have abdominal pain that increases or is concentrated in  one small area (localized).  You have persistent vomiting or diarrhea.  You have a fever.  The patient is a child younger than 3 months, and he or she has a fever.  The patient is a child older than 3 months, and he or she has a fever and persistent symptoms.  The patient is a child older than 3 months, and he or she has a fever and  symptoms suddenly get worse.  The patient is a baby, and he or she has no tears when crying. MAKE SURE YOU:   Understand these instructions.  Will watch your condition.  Will get help right away if you are not doing well or get worse. Document Released: 11/19/2005 Document Revised: 02/11/2012 Document Reviewed: 09/05/2011 [employer] Patient Information 2015 [place], [place]. This information is not intended to replace advice given to you by your health care provider. Make sure you discuss any questions you have with your health care provider.

## 2014-07-28 NOTE — Assessment & Plan Note (Signed)
BP elevated today even on recheck, likely related to not feeling well from viral GI illness. Will give refill of lisinopril. I've instructed pt to return next week for a nurse BP check when she is feeling better to make sure her BP is well controlled. She is agreeable to this plan. If remains elevated will need to titrate up on lisinopril likely.

## 2014-07-28 NOTE — Progress Notes (Signed)
Patient ID: Krista Adams, female   DOB: [birth year], 28 y.o.   MRN: [id number]  HPI:  HTN: currently taking lisinopril 10mg  daily. Does not check blood pressure at home. No chest pain, SOB, swelling, headaches, vision changes (except blurry vision when sugar is very high).  GI illness: has had diarrhea and nausea today, also generalized abdominal pain. No blood in diarrhea. Has not vomited. No fever. Able to keep liquids down.  Diabetes: currently on 70/30 but has a very hard time affording this due to high copay with her insurance. Is not able to get it all the time and has to space out her doses in order to preserve her supply. Generally takes about 40units in the morning at 40 at night, but does this just when she feels like her sugar is elevated. Only checks her CBG's when she feels bad. Has not spoken with insurance about which insulins they would be more likely to cover.  ROS: See HPI.  [place]: hx T2DM, morbid obesity, HTN  PHYSICAL EXAM: BP 146/76  Pulse 71  Temp(Src) 98.1 F (36.7 C) (Oral)  Ht 5\' 4"  (1.626 m)  Wt 297 lb 12.8 oz (135.081 kg)  BMI 51.09 kg/m2  LMP 07/01/2014 Gen: NAD HEENT: NCAT, MMM Heart: RRR, no murmurs Lungs: CTAB, NWOB Neuro: grossly nonfocal, speech intact Abd: soft, nontender to palpation, no masses or organomegaly, obese, no peritoneal signs Ext: No appreciable lower extremity edema bilaterally   ASSESSMENT/PLAN:  Health maintenance:  -recommended pt schedule physical to update pap smear & other health maintenance items  Diarrhea & abdominal pain: likely represents viral gastroenteritis. abd exam benign today, pt well hydrated. Will rx zofran prn nausea. Continue to push PO fluids to stay hydrated. F/u if not improving.   DM (diabetes mellitus), type 2, uncontrolled Poorly controlled but cost is an issue. Pt not able to afford insulin regularly. I have advised her to call her insurance company and inquire about whether there is a different type  of insulin that they would cover better. She will do this and then schedule a follow up visit to specifically discuss her diabetes.  ESSENTIAL HYPERTENSION, BENIGN BP elevated today even on recheck, likely related to not feeling well from viral GI illness. Will give refill of lisinopril. I've instructed pt to return next week for a nurse BP check when she is feeling better to make sure her BP is well controlled. She is agreeable to this plan. If remains elevated will need to titrate up on lisinopril likely.   FOLLOW UP: F/u next week for nurse BP check F/u with me separately for diabetes after speaks with insurance Schedule separate health maintenance appointment  [place] J. Ardelia Mems, [place]

## 2014-07-28 NOTE — Assessment & Plan Note (Signed)
Poorly controlled but cost is an issue. Pt not able to afford insulin regularly. I have advised her to call her insurance company and inquire about whether there is a different type of insulin that they would cover better. She will do this and then schedule a follow up visit to specifically discuss her diabetes.

## 2014-07-28 NOTE — Assessment & Plan Note (Signed)
>>  ASSESSMENT AND PLAN FOR DM (DIABETES MELLITUS), TYPE 2, UNCONTROLLED WRITTEN ON 07/28/2014 11:37 AM BY MCINTYRE, Estevan Ryder, MD  Poorly controlled but cost is an issue. Pt not able to afford insulin regularly. I have advised her to call her insurance company and inquire about whether there is a different type of insulin that they would cover better. She will do this and then schedule a follow up visit to specifically discuss her diabetes.

## 2014-07-29 ENCOUNTER — Encounter: Payer: Self-pay | Admitting: Obstetrics & Gynecology

## 2014-08-03 ENCOUNTER — Ambulatory Visit (INDEPENDENT_AMBULATORY_CARE_PROVIDER_SITE_OTHER): Payer: No Typology Code available for payment source | Admitting: *Deleted

## 2014-08-03 VITALS — BP 132/90

## 2014-08-03 DIAGNOSIS — Z0130 Encounter for examination of blood pressure without abnormal findings: Secondary | ICD-10-CM

## 2014-08-03 DIAGNOSIS — Z136 Encounter for screening for cardiovascular disorders: Secondary | ICD-10-CM

## 2014-08-03 NOTE — Progress Notes (Signed)
   Pt in nurse clinic for blood pressure check.  BP today 132/90 manually.  Pt denies any chest pain, SOB, dizziness, headache or visual changes.  Pt stated she has been out of insulin for a little while because she can't afford the medication.  Pt stated she has [place] will not use any discount pharmacy cards.  Pt told she should call a different pharmacy, tell them what medications she is taking; what insurance and ask if they accept any discount pharmacy cards.  Pt can not go to [facility] because she has insurance.  Will forward to PCP.  Derl Barrow, RN

## 2014-08-23 ENCOUNTER — Telehealth: Payer: Self-pay | Admitting: Family Medicine

## 2014-08-23 MED ORDER — LISINOPRIL 10 MG PO TABS: ORAL_TABLET | ORAL | Status: DC

## 2014-08-23 NOTE — Telephone Encounter (Signed)
BP was at goal (mildly high diastolic number at 90) at last visit. I would like to see her back in the next 1-2 months to recheck her BP. For now she can continue her current blood pressure regimen. I will send in refills for her. Please inform.  Leeanne Rio, MD

## 2014-08-23 NOTE — Telephone Encounter (Signed)
Pt called and wanted to know have we decided what medication she is taking for her BP and if so where are we going to call this in at. She needs it as cheap as possible. Please call her to discuss. jw

## 2014-08-24 NOTE — Telephone Encounter (Signed)
Patient informed, expressed understanding. 

## 2014-09-20 ENCOUNTER — Other Ambulatory Visit: Payer: Self-pay | Admitting: Family Medicine

## 2014-09-20 NOTE — Telephone Encounter (Signed)
Pt called because she doesn't have any refills on her Lisinopril. She called [facility] since she thought she have at least but they said there are none. Can we call in refills for patient? jw

## 2014-09-21 MED ORDER — LISINOPRIL 10 MG PO TABS: ORAL_TABLET | ORAL | Status: DC

## 2014-09-29 ENCOUNTER — Ambulatory Visit: Payer: No Typology Code available for payment source | Admitting: Obstetrics & Gynecology

## 2014-09-29 ENCOUNTER — Telehealth: Payer: Self-pay

## 2014-09-29 NOTE — Telephone Encounter (Signed)
Called pt and pt informed me that she did not know about her appt that was scheduled for today.  Pt informed me that she would have to call the office to reschedule her appt so that she can fit it around her schedule.  Pt had no further questions.

## 2014-12-13 ENCOUNTER — Ambulatory Visit (INDEPENDENT_AMBULATORY_CARE_PROVIDER_SITE_OTHER): Payer: No Typology Code available for payment source | Admitting: Family Medicine

## 2014-12-13 ENCOUNTER — Encounter: Payer: Self-pay | Admitting: Family Medicine

## 2014-12-13 VITALS — BP 123/94 | HR 77 | Temp 98.1°F | Ht 64.0 in | Wt 292.0 lb

## 2014-12-13 DIAGNOSIS — E1165 Type 2 diabetes mellitus with hyperglycemia: Secondary | ICD-10-CM

## 2014-12-13 DIAGNOSIS — IMO0002 Reserved for concepts with insufficient information to code with codable children: Secondary | ICD-10-CM

## 2014-12-13 DIAGNOSIS — I10 Essential (primary) hypertension: Secondary | ICD-10-CM

## 2014-12-13 LAB — GLUCOSE, CAPILLARY: Glucose-Capillary: 128 mg/dL — ABNORMAL HIGH (ref 70–99)

## 2014-12-13 LAB — POCT GLYCOSYLATED HEMOGLOBIN (HGB A1C): Hemoglobin A1C: 9.7

## 2014-12-13 MED ORDER — HYDROCHLOROTHIAZIDE 25 MG PO TABS: 25.0000 mg | ORAL_TABLET | Freq: Every day | ORAL | Status: DC

## 2014-12-13 NOTE — Progress Notes (Signed)
Patient ID: Krista Adams, female   DOB: 12/10/1985, 29 y.o.   MRN: [id number]   HPI  Patient presents today for  Follow-up diabetes and hypertension  Hypertension No chest pain, dyspnea, headaches Some mild intermittent leg edema  some  Intermittent palpitations without associated symptoms, approximately once every 3 months with sensation of extra beat  Taking meds everyday including lisinopril , does not check at home Exercises every morning - does aerobics and some limited weight lifting  Diabetes Watching her diet intermittently, exercising for above Taking less than prescribed amount of NPH , due to cost. Prescribed 80 units in the morning 30 units at night and frequently takes 30 units in the morning and 50 units at night   Smoking status noted ROS: Per HPI  Objective: BP 123/94 mmHg  Pulse 77  Temp(Src) 98.1 F (36.7 C) (Oral)  Ht 5\' 4"  (1.626 m)  Wt 292 lb (132.45 kg)  BMI 50.10 kg/m2 Gen: NAD, alert, cooperative with exam HEENT: NCAT CV: RRR, good S1/S2, no murmur Resp: CTABL, no wheezes, non-labored Ext:  Trace pitting leg edema bilaterally Neuro: Alert and oriented, No gross deficits  Assessment and plan:  ESSENTIAL HYPERTENSION, BENIGN Reasonable control No red flags Given that she is a reproductive age female Will stop lisinopril and start HCTZ HCTZ 25 mg given  borderline control on most checks here Follow-up one month with PCP  DM (diabetes mellitus), type 2, uncontrolled Poorly controlled A1c worsened from 9-9.8 most obvious problem is her not taking her insulin due to cost , her co-pay is still lower than the cheapest insulin available cash price continue current insulin prescription of 80 units NPH in the morning 30 units at night , she is taking 50 and 30 respectively  follow-up in pharmacy clinic in 4-6 weeks   Orders Placed This Encounter  Procedures  . Glucose, capillary  . POCT HgB A1C  . POCT glucose (manual entry)    Meds ordered  this encounter  Medications  . hydrochlorothiazide (HYDRODIURIL) 25 MG tablet    Sig: Take 1 tablet (25 mg total) by mouth daily.    Dispense:  90 tablet    Refill:  3

## 2014-12-13 NOTE — Patient Instructions (Addendum)
Great to meet you!  I changed your medicine: Stop lisinopril and start HCTZ  Lets get you set up to see Dr. Valentina Lucks in pharmacy clinic in the next 4-6 weeks.   Diet Recommendations for Diabetes   Starchy (carb) foods include: Bread, rice, pasta, potatoes, corn, crackers, bagels, muffins, all baked goods.   Protein foods include: Meat, fish, poultry, eggs, dairy foods, and beans such as pinto and kidney beans (beans also provide carbohydrate).   1. Eat at least 3 meals and 1-2 snacks per day. Never go more than 4-5 hours while awake without eating.  2. Limit starchy foods to TWO per meal and ONE per snack. ONE portion of a starchy  food is equal to the following:   - ONE slice of bread (or its equivalent, such as half of a hamburger bun).   - 1/2 cup of a "scoopable" starchy food such as potatoes or rice.   - 1 OUNCE (28 grams) of starchy snack foods such as crackers or pretzels (look on label).   - 15 grams of carbohydrate as shown on food label.  3. Both lunch and dinner should include a protein food, a carb food, and vegetables.   - Obtain twice as many veg's as protein or carbohydrate foods for both lunch and dinner.   - Try to keep frozen veg's on hand for a quick vegetable serving.     - Fresh or frozen veg's are best.  4. Breakfast should always include protein.

## 2014-12-13 NOTE — Assessment & Plan Note (Signed)
Reasonable control No red flags Given that she is a reproductive age female Will stop lisinopril and start HCTZ HCTZ 25 mg given  borderline control on most checks here Follow-up one month with PCP

## 2014-12-13 NOTE — Assessment & Plan Note (Signed)
Poorly controlled A1c worsened from 9-9.8 most obvious problem is her not taking her insulin due to cost , her co-pay is still lower than the cheapest insulin available cash price continue current insulin prescription of 80 units NPH in the morning 30 units at night , she is taking 50 and 30 respectively  follow-up in pharmacy clinic in 4-6 weeks

## 2014-12-13 NOTE — Assessment & Plan Note (Signed)
>>  ASSESSMENT AND PLAN FOR DM (DIABETES MELLITUS), TYPE 2, UNCONTROLLED WRITTEN ON 12/13/2014  3:43 PM BY Kevin Fenton L, MD  Poorly controlled A1c worsened from 9-9.8 most obvious problem is her not taking her insulin due to cost , her co-pay is still lower than the cheapest insulin available cash price continue current insulin prescription of 80 units NPH in the morning 30 units at night , she is taking 50 and 30 respectively  follow-up in pharmacy clinic in 4-6 weeks

## 2015-02-28 ENCOUNTER — Ambulatory Visit (INDEPENDENT_AMBULATORY_CARE_PROVIDER_SITE_OTHER): Payer: No Typology Code available for payment source | Admitting: *Deleted

## 2015-02-28 DIAGNOSIS — Z111 Encounter for screening for respiratory tuberculosis: Secondary | ICD-10-CM

## 2015-02-28 NOTE — Progress Notes (Signed)
   PPD placed Left Forearm.  Pt to return 3/30/20116 for reading.  Pt tolerated intradermal injection. Derl Barrow, RN

## 2015-03-02 ENCOUNTER — Ambulatory Visit (INDEPENDENT_AMBULATORY_CARE_PROVIDER_SITE_OTHER): Payer: No Typology Code available for payment source | Admitting: *Deleted

## 2015-03-02 ENCOUNTER — Encounter: Payer: Self-pay | Admitting: *Deleted

## 2015-03-02 DIAGNOSIS — Z7689 Persons encountering health services in other specified circumstances: Secondary | ICD-10-CM

## 2015-03-02 DIAGNOSIS — Z111 Encounter for screening for respiratory tuberculosis: Secondary | ICD-10-CM

## 2015-03-02 LAB — TB SKIN TEST
Induration: 0 mm
TB Skin Test: NEGATIVE

## 2015-03-02 NOTE — Progress Notes (Signed)
   PPD Reading Note PPD read and results entered in EpicCare. Result: 0 mm induration. Interpretation: Negative If test not read within 48-72 hours of initial placement, patient advised to repeat in other arm 1-3 weeks after this test. Allergic reaction: no  Haroun Cotham L, RN  

## 2015-10-06 DIAGNOSIS — Z9114 Patient's other noncompliance with medication regimen: Secondary | ICD-10-CM

## 2015-10-09 NOTE — Congregational Nurse Program (Signed)
Congregational Nurse Program Note  Date of Encounter: 10/06/2015  Past Medical History: Past Medical History  Diagnosis Date  . Essential hypertension, benign 05/06/2008    Qualifier: Diagnosis of  By: Jamal Collin MD, Ovid Curd    . Morbid obesity 05/06/2008    Qualifier: Diagnosis of  By: Jamal Collin MD, Ovid Curd    . DM (diabetes mellitus), type 2, uncontrolled 05/06/2008    Qualifier: Diagnosis of  By: Drue Flirt  MD, Merrily Brittle    . Anovulatory (dysfunctional uterine) bleeding 03/13/2012  . Depression with anxiety 03/18/2012  . Diabetes mellitus without complication   . Depression   . Anxiety     Encounter Details:    Client met at her work, she needed medication for her diabetes.  She is trying to get her orange card reinstated, but lacks paperwork to complete.  Client has prescriptions at [facility] for her meds but unable to afford meds.  Does not have insulin or syringes.  Acquired funds to buy 1 month worth of meds.  She will work on getting orange card renewed.  Will followup as needed.

## 2016-05-01 ENCOUNTER — Ambulatory Visit (INDEPENDENT_AMBULATORY_CARE_PROVIDER_SITE_OTHER): Payer: Self-pay | Admitting: *Deleted

## 2016-05-01 DIAGNOSIS — Z111 Encounter for screening for respiratory tuberculosis: Secondary | ICD-10-CM

## 2016-05-01 NOTE — Progress Notes (Signed)
   PPD placed Left Forearm.  Pt to return 05/03/16 for reading.  Pt tolerated intradermal injection. Derl Barrow, RN

## 2016-05-03 ENCOUNTER — Ambulatory Visit (INDEPENDENT_AMBULATORY_CARE_PROVIDER_SITE_OTHER): Payer: Self-pay | Admitting: *Deleted

## 2016-05-03 DIAGNOSIS — Z7689 Persons encountering health services in other specified circumstances: Secondary | ICD-10-CM

## 2016-05-03 DIAGNOSIS — Z111 Encounter for screening for respiratory tuberculosis: Secondary | ICD-10-CM

## 2016-05-03 LAB — TB SKIN TEST
Induration: 0 mm
TB Skin Test: NEGATIVE

## 2016-05-03 NOTE — Progress Notes (Signed)
   PPD Reading Note PPD read and results entered in Epic Result: 0 mm induration. Interpretation: Negative Allergic reaction: no Letter given with results Noted last visit with physician was Jan 2016 Offered to schedule office visit with PCP but patient declined States she has no insurance at present. Packet with requirements for orange card given Delbert Darley, Orvis Brill, RN

## 2016-05-27 ENCOUNTER — Encounter (HOSPITAL_COMMUNITY): Payer: Self-pay

## 2016-05-27 ENCOUNTER — Emergency Department (HOSPITAL_COMMUNITY)
Admission: EM | Admit: 2016-05-27 | Discharge: 2016-05-27 | Disposition: A | Discharge status: Home / Self Care | Payer: Self-pay | Attending: Emergency Medicine | Admitting: Emergency Medicine

## 2016-05-27 DIAGNOSIS — N760 Acute vaginitis: Secondary | ICD-10-CM | POA: Insufficient documentation

## 2016-05-27 DIAGNOSIS — Z794 Long term (current) use of insulin: Secondary | ICD-10-CM | POA: Insufficient documentation

## 2016-05-27 DIAGNOSIS — E119 Type 2 diabetes mellitus without complications: Secondary | ICD-10-CM | POA: Insufficient documentation

## 2016-05-27 DIAGNOSIS — J020 Streptococcal pharyngitis: Secondary | ICD-10-CM | POA: Insufficient documentation

## 2016-05-27 DIAGNOSIS — F329 Major depressive disorder, single episode, unspecified: Secondary | ICD-10-CM | POA: Insufficient documentation

## 2016-05-27 DIAGNOSIS — Z79899 Other long term (current) drug therapy: Secondary | ICD-10-CM | POA: Insufficient documentation

## 2016-05-27 DIAGNOSIS — I10 Essential (primary) hypertension: Secondary | ICD-10-CM | POA: Insufficient documentation

## 2016-05-27 DIAGNOSIS — F17210 Nicotine dependence, cigarettes, uncomplicated: Secondary | ICD-10-CM | POA: Insufficient documentation

## 2016-05-27 DIAGNOSIS — B9689 Other specified bacterial agents as the cause of diseases classified elsewhere: Secondary | ICD-10-CM | POA: Insufficient documentation

## 2016-05-27 LAB — URINE MICROSCOPIC-ADD ON: RBC / HPF: NONE SEEN RBC/hpf (ref 0–5)

## 2016-05-27 LAB — WET PREP, GENITAL
Sperm: NONE SEEN
Yeast Wet Prep HPF POC: NONE SEEN

## 2016-05-27 LAB — URINALYSIS, ROUTINE W REFLEX MICROSCOPIC
Bilirubin Urine: NEGATIVE
Glucose, UA: >1000 mg/dL — AB
Ketones, ur: 15 mg/dL — AB
Nitrite: NEGATIVE
Protein, ur: 30 mg/dL — AB
Specific Gravity, Urine: 1.039 — ABNORMAL HIGH (ref 1.005–1.030)
pH: 6 (ref 5.0–8.0)

## 2016-05-27 LAB — RAPID STREP SCREEN (MED CTR MEBANE ONLY): Streptococcus, Group A Screen (Direct): POSITIVE — AB

## 2016-05-27 LAB — POC URINE PREG, ED: Preg Test, Ur: NEGATIVE

## 2016-05-27 MED ORDER — IBUPROFEN 400 MG PO TABS
400.0000 mg | ORAL_TABLET | Freq: Once | ORAL | Status: AC
  Administered 2016-05-27: 400 mg via ORAL
  Filled 2016-05-27: qty 1

## 2016-05-27 MED ORDER — AZITHROMYCIN 250 MG PO TABS
1000.0000 mg | ORAL_TABLET | Freq: Once | ORAL | Status: AC
  Administered 2016-05-27: 1000 mg via ORAL
  Filled 2016-05-27: qty 4

## 2016-05-27 MED ORDER — CEFTRIAXONE SODIUM 250 MG IJ SOLR
250.0000 mg | Freq: Once | INTRAMUSCULAR | Status: AC
  Administered 2016-05-27: 250 mg via INTRAMUSCULAR
  Filled 2016-05-27: qty 250

## 2016-05-27 MED ORDER — METRONIDAZOLE 500 MG PO TABS: 500.0000 mg | ORAL_TABLET | Freq: Two times a day (BID) | ORAL | Status: DC

## 2016-05-27 MED ORDER — STERILE WATER FOR INJECTION IJ SOLN
INTRAMUSCULAR | Status: AC
  Administered 2016-05-27: 10 mL
  Filled 2016-05-27: qty 10

## 2016-05-27 MED ORDER — AMOXICILLIN 500 MG PO CAPS: 500.0000 mg | ORAL_CAPSULE | Freq: Once | ORAL | Status: DC

## 2016-05-27 MED ORDER — AMOXICILLIN 500 MG PO CAPS: 500.0000 mg | ORAL_CAPSULE | Freq: Three times a day (TID) | ORAL | Status: DC

## 2016-05-27 NOTE — ED Notes (Signed)
Pt ambulates independently and with steady gait at time of discharge. Discharge instructions and follow up information reviewed with patient. No other questions or concerns voiced at this time.  

## 2016-05-27 NOTE — Progress Notes (Signed)
30 y.o. F seen in the ED today for Vaginal Discharge and Sore Throat, etc. CM consulted by RN as pt had reported she was unable to afford medications if over $20.00. CM able to provide pt with GoodRx coupons for Medication (flagyl) for 9.00 and Amoxicillin is on the $4.00 list at [facility]. Pt very pleased with this price and says she is able to pay for her medications easily.Educated her about the [facility] Dept for Vaginal Discharge and the Resources they provide. No further CM needs at this time.

## 2016-05-27 NOTE — ED Provider Notes (Signed)
CSN: [id number]     Arrival date & time 05/27/16  1136 History   First MD Initiated Contact with Patient 05/27/16 1217     Chief Complaint  Patient presents with  . Vaginal Discharge  . Otalgia     (Consider location/radiation/quality/duration/timing/severity/associated sxs/prior Treatment) Patient is a 30 y.o. female presenting with vaginal discharge and ear pain. The history is provided by the patient.  Vaginal Discharge Associated symptoms: no abdominal pain, no dysuria, no fever and no vomiting   Otalgia Associated symptoms: congestion and sore throat   Associated symptoms: no abdominal pain, no cough, no fever, no headaches, no neck pain, no rash and no vomiting   Patient c/o in past week of bil ear fullness/pain, sore throat, nasal congestion.  Sore throat persistent, moderate, diffuse. No unilateral throat pain and swelling.  Denies known ill contacts. No sinus pressure or purulent drainage. Ear pain bilateral and is mildly improving. Also states bil eyes were read with some drainage/eyelash matting, and that is currently improved. Also c/o vaginal discharge, mild, whitish/yelllow, and spotting. States at baseline has irregular periods, and is unsure when last one was. Denies birth control use. No known exposure. Denies abd or pelvic pain. No fever or chills.      Past Medical History  Diagnosis Date  . Essential hypertension, benign 05/06/2008    Qualifier: Diagnosis of  By: Jamal Collin MD, Ovid Curd    . Morbid obesity ([place]) 05/06/2008    Qualifier: Diagnosis of  By: Jamal Collin MD, Ovid Curd    . DM (diabetes mellitus), type 2, uncontrolled ([place]) 05/06/2008    Qualifier: Diagnosis of  By: Drue Flirt  MD, Merrily Brittle    . Anovulatory (dysfunctional uterine) bleeding 03/13/2012  . Depression with anxiety 03/18/2012  . Diabetes mellitus without complication ([place])   . Depression   . Anxiety    History reviewed. No pertinent past surgical history. Family History  Problem Relation Age of Onset  .  Diabetes Mother   . Diabetes Maternal Aunt   . Diabetes Maternal Grandmother    Social History  Substance Use Topics  . Smoking status: Current Every Day Smoker -- 1.00 packs/day    Types: Cigars  . Smokeless tobacco: None  . Alcohol Use: No   OB History    Gravida Para Term Preterm AB TAB SAB Ectopic Multiple Living   0              Review of Systems  Constitutional: Negative for fever.  HENT: Positive for congestion, ear pain and sore throat.   Eyes: Negative for pain.  Respiratory: Negative for cough and shortness of breath.   Cardiovascular: Negative for chest pain.  Gastrointestinal: Negative for vomiting and abdominal pain.  Genitourinary: Positive for vaginal discharge. Negative for dysuria and flank pain.  Musculoskeletal: Negative for back pain and neck pain.  Skin: Negative for rash.  Neurological: Negative for headaches.  Hematological: Does not bruise/bleed easily.  Psychiatric/Behavioral: Negative for confusion.      Allergies  Review of patient's allergies indicates no known allergies.  Home Medications   Prior to Admission medications   Medication Sig Start Date End Date Taking? Authorizing Provider  Blood Glucose Monitoring Suppl W/DEVICE KIT Check blood sugar 4 times daily. 08/26/13   Leeanne Rio, MD  hydrochlorothiazide (HYDRODIURIL) 25 MG tablet Take 1 tablet (25 mg total) by mouth daily. 02/27/12 02/26/13  Luetta Nutting, DO  hydrochlorothiazide (HYDRODIURIL) 25 MG tablet Take 1 tablet (25 mg total) by mouth daily. 12/13/14   Mikeal Hawthorne  Carmin Muskrat, MD  insulin NPH-regular Human (NOVOLIN 70/30) (70-30) 100 UNIT/ML injection Inject 80 units in the morning and 40 units before dinner. 05/04/14   Lupita Dawn, MD  megestrol (MEGACE) 40 MG tablet Take 40 mg by mouth daily.    Historical Provider, MD  NEEDLE, DISP, 19 G (BD DISP NEEDLES) 19G X 1" MISC Use to inject insulin as prescribed. 05/04/14   Lupita Dawn, MD  ondansetron (ZOFRAN) 4 MG tablet Take 1  tablet (4 mg total) by mouth every 8 (eight) hours as needed for nausea or vomiting. Patient not taking: Reported on 12/13/2014 07/27/14   Leeanne Rio, MD   BP 158/94 mmHg  Pulse 64  Temp(Src) 98.8 F (37.1 C) (Oral)  Resp 18  SpO2 97% Physical Exam  Constitutional: She appears well-developed and well-nourished. No distress.  HENT:  Clear fluid behind tms. eac clears, non tender, no edema. No mastoid tenderness. Mild nasal congestion. Pharynx w mild erythema. No asymmetric swelling or abscess. No trismus.   Eyes: Conjunctivae and EOM are normal. Pupils are equal, round, and reactive to light. Right eye exhibits no discharge. Left eye exhibits no discharge. No scleral icterus.  Neck: Neck supple. No tracheal deviation present.  No stiffness.   Cardiovascular: Normal rate, regular rhythm, normal heart sounds and intact distal pulses.   No murmur heard. Pulmonary/Chest: Effort normal and breath sounds normal. No respiratory distress.  Abdominal: Soft. Normal appearance and bowel sounds are normal. She exhibits no distension. There is no tenderness.  Genitourinary:  No cva tenderness. Mild whitish d/c. Cervix closed. No cmt. No adx masses/tenderness.   Musculoskeletal: She exhibits no edema or tenderness.  Lymphadenopathy:    She has no cervical adenopathy.  Neurological: She is alert.  Skin: Skin is warm and dry. No rash noted.  Psychiatric: She has a normal mood and affect.  Nursing note and vitals reviewed.   ED Course  Procedures (including critical care time) Labs Review  Results for orders placed or performed during the hospital encounter of 05/27/16  Rapid strep screen (not at [facility])  Result Value Ref Range   Streptococcus, Group A Screen (Direct) POSITIVE (A) NEGATIVE  Wet prep, genital  Result Value Ref Range   Yeast Wet Prep HPF POC NONE SEEN NONE SEEN   Trich, Wet Prep PRESENT (A) NONE SEEN   Clue Cells Wet Prep HPF POC PRESENT (A) NONE SEEN   WBC, Wet Prep HPF  POC MANY (A) NONE SEEN   Sperm NONE SEEN   Urinalysis, Routine w reflex microscopic (not at [facility])  Result Value Ref Range   Color, Urine AMBER (A) YELLOW   APPearance CLOUDY (A) CLEAR   Specific Gravity, Urine 1.039 (H) 1.005 - 1.030   pH 6.0 5.0 - 8.0   Glucose, UA >1000 (A) NEGATIVE mg/dL   Hgb urine dipstick TRACE (A) NEGATIVE   Bilirubin Urine NEGATIVE NEGATIVE   Ketones, ur 15 (A) NEGATIVE mg/dL   Protein, ur 30 (A) NEGATIVE mg/dL   Nitrite NEGATIVE NEGATIVE   Leukocytes, UA SMALL (A) NEGATIVE  Urine microscopic-add on  Result Value Ref Range   Squamous Epithelial / LPF 0-5 (A) NONE SEEN   WBC, UA 0-5 0 - 5 WBC/hpf   RBC / HPF NONE SEEN 0 - 5 RBC/hpf   Bacteria, UA RARE (A) NONE SEEN  POC Urine Pregnancy, ED (do NOT order at [facility])  Result Value Ref Range   Preg Test, Ur NEGATIVE NEGATIVE  I have personally reviewed and evaluated these lab results as part of my medical decision-making.    MDM   Will send strep screen.    Pelvic cart/exam.    Reviewed nursing notes and prior charts for additional history.   Patients uri symptoms and exam, felt most c/w viral uri.  Discussed meds for symptom relief.  Strep test positive.   Patient confirms nkda.   For vag d/c, is sexually active, wet prep +clue cell/trich. Rocephin/zithromax in ED.  rx flagyl for home.      Lajean Saver, MD 05/27/16 [phone]

## 2016-05-27 NOTE — Discharge Instructions (Signed)
It was our pleasure to provide your ER care today - we hope that you feel better.  Take antibiotics as prescribed.  Do not drink any alcohol when taking these antibiotics.   Take motrin or aleve as need for pain.  You may use throat lozenges as need for symptom relief.   Follow up with primary care doctor in 1 week if symptoms fail to improve/resolve.  Your blood pressure is high today - follow up with primary care doctor in 1 week.   Return to ER if worse, new symptoms, trouble breathing, unable to swallow, severe abdominal pain, other concern.      Strep Throat Strep throat is a bacterial infection of the throat. Your health care provider may call the infection tonsillitis or pharyngitis, depending on whether there is swelling in the tonsils or at the back of the throat. Strep throat is most common during the cold months of the year in children who are 23-14 years of age, but it can happen during any season in people of any age. This infection is spread from person to person (contagious) through coughing, sneezing, or close contact. CAUSES Strep throat is caused by the bacteria called Streptococcus pyogenes. RISK FACTORS This condition is more likely to develop in:  People who spend time in crowded places where the infection can spread easily.  People who have close contact with someone who has strep throat. SYMPTOMS Symptoms of this condition include:  Fever or chills.   Redness, swelling, or pain in the tonsils or throat.  Pain or difficulty when swallowing.  White or yellow spots on the tonsils or throat.  Swollen, tender glands in the neck or under the jaw.  Red rash all over the body (rare). DIAGNOSIS This condition is diagnosed by performing a rapid strep test or by taking a swab of your throat (throat culture test). Results from a rapid strep test are usually ready in a few minutes, but throat culture test results are available after one or two days. TREATMENT This  condition is treated with antibiotic medicine. HOME CARE INSTRUCTIONS Medicines  Take over-the-counter and prescription medicines only as told by your health care provider.  Take your antibiotic as told by your health care provider. Do not stop taking the antibiotic even if you start to feel better.  Have family members who also have a sore throat or fever tested for strep throat. They may need antibiotics if they have the strep infection. Eating and Drinking  Do not share food, drinking cups, or personal items that could cause the infection to spread to other people.  If swallowing is difficult, try eating soft foods until your sore throat feels better.  Drink enough fluid to keep your urine clear or pale yellow. General Instructions  Gargle with a salt-water mixture 3-4 times per day or as needed. To make a salt-water mixture, completely dissolve -1 tsp of salt in 1 cup of warm water.  Make sure that all household members wash their hands well.  Get plenty of rest.  Stay home from school or work until you have been taking antibiotics for 24 hours.  Keep all follow-up visits as told by your health care provider. This is important. SEEK MEDICAL CARE IF:  The glands in your neck continue to get bigger.  You develop a rash, cough, or earache.  You cough up a thick liquid that is green, yellow-brown, or bloody.  You have pain or discomfort that does not get better with medicine.  Your problems seem to be getting worse rather than better.  You have a fever. SEEK IMMEDIATE MEDICAL CARE IF:  You have new symptoms, such as vomiting, severe headache, stiff or painful neck, chest pain, or shortness of breath.  You have severe throat pain, drooling, or changes in your voice.  You have swelling of the neck, or the skin on the neck becomes red and tender.  You have signs of dehydration, such as fatigue, dry mouth, and decreased urination.  You become increasingly sleepy, or you  cannot wake up completely.  Your joints become red or painful.   This information is not intended to replace advice given to you by your health care provider. Make sure you discuss any questions you have with your health care provider.   Document Released: 11/16/2000 Document Revised: 08/10/2015 Document Reviewed: 03/14/2015 Elsevier Interactive Patient Education 2016 [employer].    Vaginitis Vaginitis is an inflammation of the vagina. It is most often caused by a change in the normal balance of the bacteria and yeast that live in the vagina. This change in balance causes an overgrowth of certain bacteria or yeast, which causes the inflammation. There are different types of vaginitis, but the most common types are:  Bacterial vaginosis.  Yeast infection (candidiasis).  Trichomoniasis vaginitis. This is a sexually transmitted infection (STI).  Viral vaginitis.  Atrophic vaginitis.  Allergic vaginitis. CAUSES  The cause depends on the type of vaginitis. Vaginitis can be caused by:  Bacteria (bacterial vaginosis).  Yeast (yeast infection).  A parasite (trichomoniasis vaginitis)  A virus (viral vaginitis).  Low hormone levels (atrophic vaginitis). Low hormone levels can occur during pregnancy, breastfeeding, or after menopause.  Irritants, such as bubble baths, scented tampons, and feminine sprays (allergic vaginitis). Other factors can change the normal balance of the yeast and bacteria that live in the vagina. These include:  Antibiotic medicines.  Poor hygiene.  Diaphragms, vaginal sponges, spermicides, birth control pills, and intrauterine devices (IUD).  Sexual intercourse.  Infection.  Uncontrolled diabetes.  A weakened immune system. SYMPTOMS  Symptoms can vary depending on the cause of the vaginitis. Common symptoms include:  Abnormal vaginal discharge.  The discharge is white, gray, or yellow with bacterial vaginosis.  The discharge is thick,  white, and cheesy with a yeast infection.  The discharge is frothy and yellow or greenish with trichomoniasis.  A bad vaginal odor.  The odor is fishy with bacterial vaginosis.  Vaginal itching, pain, or swelling.  Painful intercourse.  Pain or burning when urinating. Sometimes, there are no symptoms. TREATMENT  Treatment will vary depending on the type of infection.   Bacterial vaginosis and trichomoniasis are often treated with antibiotic creams or pills.  Yeast infections are often treated with antifungal medicines, such as vaginal creams or suppositories.  Viral vaginitis has no cure, but symptoms can be treated with medicines that relieve discomfort. Your sexual partner should be treated as well.  Atrophic vaginitis may be treated with an estrogen cream, pill, suppository, or vaginal ring. If vaginal dryness occurs, lubricants and moisturizing creams may help. You may be told to avoid scented soaps, sprays, or douches.  Allergic vaginitis treatment involves quitting the use of the product that is causing the problem. Vaginal creams can be used to treat the symptoms. HOME CARE INSTRUCTIONS   Take all medicines as directed by your caregiver.  Keep your genital area clean and dry. Avoid soap and only rinse the area with water.  Avoid douching. It can remove the  healthy bacteria in the vagina.  Do not use tampons or have sexual intercourse until your vaginitis has been treated. Use sanitary pads while you have vaginitis.  Wipe from front to back. This avoids the spread of bacteria from the rectum to the vagina.  Let air reach your genital area.  Wear cotton underwear to decrease moisture buildup.  Avoid wearing underwear while you sleep until your vaginitis is gone.  Avoid tight pants and underwear or nylons without a cotton panel.  Take off wet clothing (especially bathing suits) as soon as possible.  Use mild, non-scented products. Avoid using irritants, such  as:  Scented feminine sprays.  Fabric softeners.  Scented detergents.  Scented tampons.  Scented soaps or bubble baths.  Practice safe sex and use condoms. Condoms may prevent the spread of trichomoniasis and viral vaginitis. SEEK MEDICAL CARE IF:   You have abdominal pain.  You have a fever or persistent symptoms for more than 2-3 days.  You have a fever and your symptoms suddenly get worse.   This information is not intended to replace advice given to you by your health care provider. Make sure you discuss any questions you have with your health care provider.   Document Released: 09/16/2007 Document Revised: 04/05/2015 Document Reviewed: 05/01/2012 Elsevier Interactive Patient Education 2016 [employer].    Hypertension Hypertension, commonly called high blood pressure, is when the force of blood pumping through your arteries is too strong. Your arteries are the blood vessels that carry blood from your heart throughout your body. A blood pressure reading consists of a higher number over a lower number, such as 110/72. The higher number (systolic) is the pressure inside your arteries when your heart pumps. The lower number (diastolic) is the pressure inside your arteries when your heart relaxes. Ideally you want your blood pressure below 120/80. Hypertension forces your heart to work harder to pump blood. Your arteries may become narrow or stiff. Having untreated or uncontrolled hypertension can cause heart attack, stroke, kidney disease, and other problems. RISK FACTORS Some risk factors for high blood pressure are controllable. Others are not.  Risk factors you cannot control include:   Race. You may be at higher risk if you are African American.  Age. Risk increases with age.  Gender. Men are at higher risk than women before age 89 years. After age 49, women are at higher risk than men. Risk factors you can control include:  Not getting enough exercise or physical  activity.  Being overweight.  Getting too much fat, sugar, calories, or salt in your diet.  Drinking too much alcohol. SIGNS AND SYMPTOMS Hypertension does not usually cause signs or symptoms. Extremely high blood pressure (hypertensive crisis) may cause headache, anxiety, shortness of breath, and nosebleed. DIAGNOSIS To check if you have hypertension, your health care provider will measure your blood pressure while you are seated, with your arm held at the level of your heart. It should be measured at least twice using the same arm. Certain conditions can cause a difference in blood pressure between your right and left arms. A blood pressure reading that is higher than normal on one occasion does not mean that you need treatment. If it is not clear whether you have high blood pressure, you may be asked to return on a different day to have your blood pressure checked again. Or, you may be asked to monitor your blood pressure at home for 1 or more weeks. TREATMENT Treating high blood pressure includes making  lifestyle changes and possibly taking medicine. Living a healthy lifestyle can help lower high blood pressure. You may need to change some of your habits. Lifestyle changes may include:  Following the DASH diet. This diet is high in fruits, vegetables, and whole grains. It is low in salt, red meat, and added sugars.  Keep your sodium intake below 2,300 mg per day.  Getting at least 30-45 minutes of aerobic exercise at least 4 times per week.  Losing weight if necessary.  Not smoking.  Limiting alcoholic beverages.  Learning ways to reduce stress. Your health care provider may prescribe medicine if lifestyle changes are not enough to get your blood pressure under control, and if one of the following is true:  You are 51-73 years of age and your systolic blood pressure is above 140.  You are 65 years of age or older, and your systolic blood pressure is above 150.  Your diastolic  blood pressure is above 90.  You have diabetes, and your systolic blood pressure is over [id number] or your diastolic blood pressure is over 90.  You have kidney disease and your blood pressure is above 140/90.  You have heart disease and your blood pressure is above 140/90. Your personal target blood pressure may vary depending on your medical conditions, your age, and other factors. HOME CARE INSTRUCTIONS  Have your blood pressure rechecked as directed by your health care provider.   Take medicines only as directed by your health care provider. Follow the directions carefully. Blood pressure medicines must be taken as prescribed. The medicine does not work as well when you skip doses. Skipping doses also puts you at risk for problems.  Do not smoke.   Monitor your blood pressure at home as directed by your health care provider. SEEK MEDICAL CARE IF:   You think you are having a reaction to medicines taken.  You have recurrent headaches or feel dizzy.  You have swelling in your ankles.  You have trouble with your vision. SEEK IMMEDIATE MEDICAL CARE IF:  You develop a severe headache or confusion.  You have unusual weakness, numbness, or feel faint.  You have severe chest or abdominal pain.  You vomit repeatedly.  You have trouble breathing. MAKE SURE YOU:   Understand these instructions.  Will watch your condition.  Will get help right away if you are not doing well or get worse.   This information is not intended to replace advice given to you by your health care provider. Make sure you discuss any questions you have with your health care provider.   Document Released: 11/19/2005 Document Revised: 04/05/2015 Document Reviewed: 09/11/2013 Elsevier Interactive Patient Education [employer].

## 2016-05-27 NOTE — ED Notes (Signed)
Care management to see

## 2016-05-27 NOTE — ED Notes (Signed)
Patient here with multiple complaints. Bilateral ear pain, sore throat, and vaginal discharge x 1 week. Also reports some thick drainage from eyes

## 2016-05-28 LAB — GC/CHLAMYDIA PROBE AMP (~~LOC~~) NOT AT ARMC
Chlamydia: NEGATIVE
Neisseria Gonorrhea: NEGATIVE

## 2017-05-10 ENCOUNTER — Ambulatory Visit (INDEPENDENT_AMBULATORY_CARE_PROVIDER_SITE_OTHER): Payer: Self-pay | Admitting: *Deleted

## 2017-05-10 VITALS — BP 168/90 | HR 56

## 2017-05-10 DIAGNOSIS — Z794 Long term (current) use of insulin: Secondary | ICD-10-CM

## 2017-05-10 DIAGNOSIS — Z111 Encounter for screening for respiratory tuberculosis: Secondary | ICD-10-CM

## 2017-05-10 DIAGNOSIS — IMO0002 Reserved for concepts with insufficient information to code with codable children: Secondary | ICD-10-CM

## 2017-05-10 DIAGNOSIS — E118 Type 2 diabetes mellitus with unspecified complications: Secondary | ICD-10-CM

## 2017-05-10 DIAGNOSIS — E1165 Type 2 diabetes mellitus with hyperglycemia: Secondary | ICD-10-CM

## 2017-05-10 LAB — POCT GLYCOSYLATED HEMOGLOBIN (HGB A1C): Hemoglobin A1C: 9.1

## 2017-05-10 NOTE — Progress Notes (Signed)
Patient presents for PPD placement for work Denies previous positive TB test  Denies known exposure to TB   Tuberculin skin test applied to left ventral forearm.  Patient aware that she needs to return in 48-72 hours for PPD reading.  Upon chart review noted patient had not been seen for DM/HTN f/u since 12/13/2014. Patient's HgbA1c was 9.7 at that time. Patient states she wants to come in but doesn't have insurance. Has been using 50 units 70/30 TID from [employer]. AM fasting BG has been 98-230.  Has not had BP med > 1 year Vitals:   05/10/17 0925  BP: (!) 168/90  Pulse: (!) 56  SpO2                  97%  HgbA1c done today = 9.1  Patient met with financial counselor directly after visit and will bring paperwork back on Monday to apply for orange card. Will schedule visit with PCP at that time.  Patient works private duty and hours vary. States she has a home and transportation and denies food insecurity. Has no dependents. Will forward to PCP. Preceptor, Dr. Chambliss aware of situation.  L. Ducatte, RN, BSN   

## 2017-05-13 ENCOUNTER — Ambulatory Visit: Payer: Self-pay | Admitting: *Deleted

## 2017-05-13 DIAGNOSIS — Z111 Encounter for screening for respiratory tuberculosis: Secondary | ICD-10-CM

## 2017-05-13 LAB — TB SKIN TEST
Induration: 0 mm
TB Skin Test: NEGATIVE

## 2017-05-13 NOTE — Progress Notes (Signed)
PPD Reading Note  PPD read and results entered in [employer].  Result: 0 mm induration.  Interpretation: negative  If test not read within 48-72 hours of initial placement, patient advised to repeat in other arm 1-3 weeks after this test.  Allergic reaction: no

## 2017-05-14 ENCOUNTER — Telehealth: Payer: Self-pay | Admitting: Family Medicine

## 2017-05-14 NOTE — Telephone Encounter (Signed)
This patient was seen for a TB test and [name] got a HgbA1C that was elevated to 9.1. She has variable hours, so appts may be difficult for her. Could you please call her and get her an appointment with me soon? It also looked like she was supposed to get an appointment for the orange card and did not yet. Thanks!

## 2017-05-15 NOTE — Telephone Encounter (Signed)
LVM for pt to call the office. If pt calls, please schedule an appt with Dr. Lindell Noe. Ottis Stain, CMA

## 2017-05-23 NOTE — Telephone Encounter (Signed)
Appt for FA tomorrow, will ask [name] to schedule her an appt.Krista Adams, Krista Adams, Krista Adams

## 2017-05-24 ENCOUNTER — Ambulatory Visit: Payer: Self-pay

## 2018-09-29 ENCOUNTER — Ambulatory Visit (INDEPENDENT_AMBULATORY_CARE_PROVIDER_SITE_OTHER): Payer: Self-pay

## 2018-09-29 ENCOUNTER — Ambulatory Visit (HOSPITAL_COMMUNITY)
Admission: EM | Admit: 2018-09-29 | Discharge: 2018-09-29 | Disposition: A | Discharge status: Home / Self Care | Payer: Self-pay | Attending: Family Medicine | Admitting: Family Medicine

## 2018-09-29 ENCOUNTER — Other Ambulatory Visit: Payer: Self-pay

## 2018-09-29 DIAGNOSIS — J181 Lobar pneumonia, unspecified organism: Secondary | ICD-10-CM

## 2018-09-29 DIAGNOSIS — R0602 Shortness of breath: Secondary | ICD-10-CM

## 2018-09-29 DIAGNOSIS — J189 Pneumonia, unspecified organism: Secondary | ICD-10-CM

## 2018-09-29 DIAGNOSIS — R062 Wheezing: Secondary | ICD-10-CM

## 2018-09-29 MED ORDER — ALBUTEROL SULFATE HFA 108 (90 BASE) MCG/ACT IN AERS
2.0000 | INHALATION_SPRAY | Freq: Once | RESPIRATORY_TRACT | Status: AC
  Administered 2018-09-29: 2 via RESPIRATORY_TRACT

## 2018-09-29 MED ORDER — ALBUTEROL SULFATE HFA 108 (90 BASE) MCG/ACT IN AERS
INHALATION_SPRAY | RESPIRATORY_TRACT | Status: AC
  Filled 2018-09-29: qty 6.7

## 2018-09-29 MED ORDER — PREDNISONE 10 MG (21) PO TBPK: ORAL_TABLET | Freq: Every day | ORAL | 0 refills | Status: DC

## 2018-09-29 MED ORDER — HYDROCODONE-HOMATROPINE 5-1.5 MG/5ML PO SYRP: 5.0000 mL | ORAL_SOLUTION | Freq: Four times a day (QID) | ORAL | 0 refills | Status: DC | PRN

## 2018-09-29 MED ORDER — AZITHROMYCIN 250 MG PO TABS: 250.0000 mg | ORAL_TABLET | Freq: Every day | ORAL | 0 refills | Status: DC

## 2018-09-29 MED ORDER — IPRATROPIUM-ALBUTEROL 0.5-2.5 (3) MG/3ML IN SOLN
3.0000 mL | Freq: Once | RESPIRATORY_TRACT | Status: AC
  Administered 2018-09-29: 3 mL via RESPIRATORY_TRACT

## 2018-09-29 MED ORDER — IPRATROPIUM-ALBUTEROL 0.5-2.5 (3) MG/3ML IN SOLN
RESPIRATORY_TRACT | Status: AC
  Filled 2018-09-29: qty 3

## 2018-09-29 NOTE — ED Provider Notes (Signed)
[place]   [id number] 09/29/18 Arrival Time: 1013  ASSESSMENT & PLAN:  1. SOB (shortness of breath)   2. Wheezing   3. Pneumonia of both lower lobes due to infectious organism [facility])     Meds ordered this encounter  Medications  . ipratropium-albuterol (DUONEB) 0.5-2.5 (3) MG/3ML nebulizer solution 3 mL  . azithromycin (ZITHROMAX) 250 MG tablet    Sig: Take 1 tablet (250 mg total) by mouth daily. Take first 2 tablets together, then 1 every day until finished.    Dispense:  6 tablet    Refill:  0  . predniSONE (STERAPRED UNI-PAK 21 TAB) 10 MG (21) TBPK tablet    Sig: Take by mouth daily. Take as directed.    Dispense:  21 tablet    Refill:  0  . HYDROcodone-homatropine (HYCODAN) 5-1.5 MG/5ML syrup    Sig: Take 5 mLs by mouth every 6 (six) hours as needed for cough.    Dispense:  90 mL    Refill:  0   Discussed importance of completing full course of antibiotic and close f/u if not improving steadily. Cough medication sedation precautions. Discussed typical duration of symptoms. OTC symptom care as needed. Ensure adequate fluid intake and rest.   Follow-up Information    Sela Hilding, MD.   Specialty:  Family Medicine Why:  As needed. Contact information: [place] [address] [phone]        [place].   Specialty:  Urgent Care Why:  If you are not seeing some improvement over the next 48 hours. Sooner if needed. Contact information: [place] [phone]         Reviewed expectations re: course of current medical issues. Questions answered. Outlined signs and symptoms indicating need for more acute intervention. Patient verbalized understanding. After Visit Summary given.   SUBJECTIVE: History from: patient.  Krista Adams is a 32 y.o. female who presents with complaint of feeling short of breath along with fairly persistent  wheezing for the past 3 days; maybe longer. Gradual onset of symptoms. Overall with fatigue and with mild body aches. No h/o asthma. Does report feeling more SOB over the past 24 hours. No CP. Does smoke regularly; Black and Mild cigars. Fever: questions subjective. Overall normal PO intake without n/v. Sick contacts: no. No specific or significant aggravating or alleviating factors reported. OTC treatment: none.  Received flu shot this year: no.  Social History   Tobacco Use  Smoking Status Current Every Day Smoker  . Packs/day: 1.00  . Types: Cigars    ROS: As per HPI. All other systems negative.    OBJECTIVE:  Vitals:   09/29/18 1047 09/29/18 1049  BP: (!) 157/82   Pulse: 75   Resp: 18   Temp: 98.6 F (37 C)   TempSrc: Oral   SpO2: 97%   Weight:  132.5 kg    General appearance: alert; appears fatigued HEENT: nasal congestion; clear runny nose; throat normal without erythema Neck: supple without LAD; trachea midline CV: RRR without murmer Lungs: slightly labored respirations; able to speak in short sentences; symmetrical air entry with bilateral expiratory wheezing; cough: moderate Back: no CVA tenderness Abd: obese; soft Ext: without edema; symmetrical Psychological: alert and cooperative; normal mood and affect  Imaging: Dg Chest 2 View  Result Date: 09/29/2018 CLINICAL DATA:  Cough and wheezing for 1 week EXAM: CHEST - 2 VIEW COMPARISON:  None. FINDINGS: Cardiac  shadow is at the upper limits of normal in size. The lungs are well aerated bilaterally. Bibasilar mild infiltrates are noted. No sizable effusion is seen. No bony abnormality is noted. IMPRESSION: Mild bibasilar infiltrates. Electronically Signed   By: Inez Catalina M.D.   On: 09/29/2018 11:35    No Known Allergies  Past Medical History:  Diagnosis Date  . Anovulatory (dysfunctional uterine) bleeding 03/13/2012  . Anxiety   . Depression   . Depression with anxiety 03/18/2012  . Diabetes mellitus  without complication ([place])   . DM (diabetes mellitus), type 2, uncontrolled ([place]) 05/06/2008   Qualifier: Diagnosis of  By: Drue Flirt  MD, Merrily Brittle    . Essential hypertension, benign 05/06/2008   Qualifier: Diagnosis of  By: Jamal Collin MD, Ovid Curd    . Morbid obesity ([place]) 05/06/2008   Qualifier: Diagnosis of  By: Jamal Collin MD, Ovid Curd     Family History  Problem Relation Age of Onset  . Diabetes Mother   . Diabetes Maternal Aunt   . Diabetes Maternal Grandmother    Social History   Socioeconomic History  . Marital status: Single    Spouse name: Not on file  . Number of children: Not on file  . Years of education: Not on file  . Highest education level: Not on file  Occupational History  . Not on file  Social Needs  . Financial resource strain: Not on file  . Food insecurity:    Worry: Not on file    Inability: Not on file  . Transportation needs:    Medical: Not on file    Non-medical: Not on file  Tobacco Use  . Smoking status: Current Every Day Smoker    Packs/day: 1.00    Types: Cigars  Substance and Sexual Activity  . Alcohol use: No  . Drug use: No  . Sexual activity: Yes    Partners: Male    Birth control/protection: None  Lifestyle  . Physical activity:    Days per week: Not on file    Minutes per session: Not on file  . Stress: Not on file  Relationships  . Social connections:    Talks on phone: Not on file    Gets together: Not on file    Attends religious service: Not on file    Active member of club or organization: Not on file    Attends meetings of clubs or organizations: Not on file    Relationship status: Not on file  . Intimate partner violence:    Fear of current or ex partner: Not on file    Emotionally abused: Not on file    Physically abused: Not on file    Forced sexual activity: Not on file  Other Topics Concern  . Not on file  Social History Narrative  . Not on file           Vanessa Kick, MD 10/01/18 [phone]

## 2018-09-29 NOTE — ED Notes (Signed)
Given instruction on use of inhaler

## 2019-04-17 ENCOUNTER — Telehealth: Payer: Self-pay

## 2019-04-17 NOTE — Telephone Encounter (Signed)
LVM asking her to call the office to schedule an apt for diabetes management.

## 2019-05-23 ENCOUNTER — Telehealth (HOSPITAL_COMMUNITY): Payer: Self-pay | Admitting: Emergency Medicine

## 2019-05-23 ENCOUNTER — Encounter (HOSPITAL_COMMUNITY): Payer: Self-pay

## 2019-05-23 ENCOUNTER — Other Ambulatory Visit: Payer: Self-pay

## 2019-05-23 ENCOUNTER — Ambulatory Visit (HOSPITAL_COMMUNITY)
Admission: EM | Admit: 2019-05-23 | Discharge: 2019-05-23 | Disposition: A | Discharge status: Home / Self Care | Payer: Self-pay | Attending: Family Medicine | Admitting: Family Medicine

## 2019-05-23 DIAGNOSIS — E1169 Type 2 diabetes mellitus with other specified complication: Secondary | ICD-10-CM

## 2019-05-23 DIAGNOSIS — N390 Urinary tract infection, site not specified: Secondary | ICD-10-CM

## 2019-05-23 DIAGNOSIS — I10 Essential (primary) hypertension: Secondary | ICD-10-CM

## 2019-05-23 LAB — POCT URINALYSIS DIP (DEVICE)
Bilirubin Urine: NEGATIVE
Glucose, UA: 100 mg/dL — AB
Ketones, ur: NEGATIVE mg/dL
Nitrite: POSITIVE — AB
Protein, ur: 30 mg/dL — AB
Specific Gravity, Urine: >=1.03 (ref 1.005–1.030)
Urobilinogen, UA: 0.2 mg/dL (ref 0.0–1.0)
pH: 6 (ref 5.0–8.0)

## 2019-05-23 MED ORDER — PHENAZOPYRIDINE HCL 200 MG PO TABS: 200.0000 mg | ORAL_TABLET | Freq: Three times a day (TID) | ORAL | 0 refills | Status: DC | PRN

## 2019-05-23 MED ORDER — CEPHALEXIN 500 MG PO CAPS: 500.0000 mg | ORAL_CAPSULE | Freq: Two times a day (BID) | ORAL | 0 refills | Status: AC

## 2019-05-23 MED ORDER — CEPHALEXIN 500 MG PO CAPS: 500.0000 mg | ORAL_CAPSULE | Freq: Two times a day (BID) | ORAL | 0 refills | Status: DC

## 2019-05-23 NOTE — Discharge Instructions (Signed)
Please follow-up with PCP for better management of diabetes along with achieving better blood pressure control. Return here for follow-up for with PCP if symptoms of UTI do not resolve or if they worsen.

## 2019-05-23 NOTE — ED Provider Notes (Signed)
[place]    CSN: [id number] Arrival date & time: 05/23/19  1121      History   Chief Complaint Chief Complaint  Patient presents with  . Urinary Tract Infection    HPI Krista Adams is a 33 y.o. female.   HPI  Krista Adams presents today with symptoms concerning for UTI. Dysuria, urine frequency, and CVA tenderness present for two days. Admits to uncontrolled blood sugars as patient is a diabetic. Her blood pressure is elevated on arrival today.  She is followed by family medicine for primary care management although has not been seen in some time.  Denies fever, nausea, or vomiting. Past Medical History:  Diagnosis Date  . Anovulatory (dysfunctional uterine) bleeding 03/13/2012  . Anxiety   . Depression   . Depression with anxiety 03/18/2012  . Diabetes mellitus without complication ([place])   . DM (diabetes mellitus), type 2, uncontrolled ([place]) 05/06/2008   Qualifier: Diagnosis of  By: Drue Flirt  MD, Merrily Brittle    . Essential hypertension, benign 05/06/2008   Qualifier: Diagnosis of  By: Jamal Collin MD, Ovid Curd    . Morbid obesity ([place]) 05/06/2008   Qualifier: Diagnosis of  By: Jamal Collin MD, Ovid Curd      Patient Active Problem List   Diagnosis Date Noted  . Left arm numbness 05/09/2014  . Left shoulder pain 05/09/2014  . Family planning 03/29/2013  . Insomnia 06/29/2012  . Depression with anxiety 03/18/2012  . Anovulatory (dysfunctional uterine) bleeding 03/13/2012  . Contraception management 10/28/2011  . Nevus of toe 09/24/2011  . HIP PAIN, LEFT 01/09/2010  . KNEE PAIN, LEFT 01/09/2010  . DM (diabetes mellitus), type 2, uncontrolled ([place]) 05/06/2008  . Morbid obesity ([place]) 05/06/2008  . ESSENTIAL HYPERTENSION, BENIGN 05/06/2008    History reviewed. No pertinent surgical history.  OB History    Gravida  0   Para      Term      Preterm      AB      Living        SAB      TAB      Ectopic      Multiple      Live Births                Home Medications    Prior to Admission medications   Medication Sig Start Date End Date Taking? Authorizing Provider  amoxicillin (AMOXIL) 500 MG capsule Take 1 capsule (500 mg total) by mouth 3 (three) times daily. 05/27/16   Lajean Saver, MD  azithromycin (ZITHROMAX) 250 MG tablet Take 1 tablet (250 mg total) by mouth daily. Take first 2 tablets together, then 1 every day until finished. 09/29/18   Vanessa Kick, MD  Blood Glucose Monitoring Suppl W/DEVICE KIT Check blood sugar 4 times daily. 08/26/13   Leeanne Rio, MD  hydrochlorothiazide (HYDRODIURIL) 25 MG tablet Take 1 tablet (25 mg total) by mouth daily. 12/13/14   Timmothy Euler, MD  HYDROcodone-homatropine [facility]) 5-1.5 MG/5ML syrup Take 5 mLs by mouth every 6 (six) hours as needed for cough. 09/29/18   Vanessa Kick, MD  ibuprofen (ADVIL,MOTRIN) 400 MG tablet Take 400 mg by mouth every 6 (six) hours as needed for mild pain.    [provider]  insulin NPH-regular Human (NOVOLIN 70/30) (70-30) 100 UNIT/ML injection Inject 80 units in the morning and 40 units before dinner. 05/04/14   Lupita Dawn, MD  megestrol (MEGACE) 40 MG tablet Take 40 mg by  mouth daily.    [provider]  metroNIDAZOLE (FLAGYL) 500 MG tablet Take 1 tablet (500 mg total) by mouth 2 (two) times daily. 05/27/16   Lajean Saver, MD  NEEDLE, DISP, 19 G (BD DISP NEEDLES) 19G X 1" MISC Use to inject insulin as prescribed. 05/04/14   Lupita Dawn, MD  predniSONE (STERAPRED UNI-PAK 21 TAB) 10 MG (21) TBPK tablet Take by mouth daily. Take as directed. 09/29/18   Vanessa Kick, MD  Liraglutide (VICTOZA) 18 MG/3ML SOLN Inject 0.3 mLs (1.8 mg total) into the skin daily. 10/17/11 02/27/12  Luetta Nutting, DO  olmesartan-hydrochlorothiazide (BENICAR HCT) 40-25 MG per tablet Take 1 tablet by mouth daily. 01/21/12 02/27/12  Luetta Nutting, DO    Family History Family History  Problem Relation Age of Onset  . Diabetes Mother   . Diabetes Maternal Aunt    . Diabetes Maternal Grandmother     Social History Social History   Tobacco Use  . Smoking status: Current Every Day Smoker    Packs/day: 1.00    Types: Cigars  . Smokeless tobacco: Never Used  Substance Use Topics  . Alcohol use: No  . Drug use: No     Allergies   Patient has no known allergies.   Review of Systems Review of Systems Pertinent negatives listed in HPI Physical Exam Triage Vital Signs ED Triage Vitals  Enc Vitals Group     BP 05/23/19 1206 (!) 158/102     Pulse --      Resp 05/23/19 1206 18     Temp 05/23/19 1206 98.3 F (36.8 C)     Temp Source 05/23/19 1206 Oral     SpO2 05/23/19 1206 100 %     Weight 05/23/19 1209 290 lb (131.5 kg)     Height --      Head Circumference --      Peak Flow --      Pain Score 05/23/19 1209 8     Pain Loc --      Pain Edu? --      Excl. in [place]? --    No data found.  Updated Vital Signs BP (!) 158/102 (BP Location: Right Arm)   Temp 98.3 F (36.8 C) (Oral)   Resp 18   Wt 290 lb (131.5 kg)   SpO2 100%   BMI 49.78 kg/m   Visual Acuity Right Eye Distance:   Left Eye Distance:   Bilateral Distance:    Right Eye Near:   Left Eye Near:    Bilateral Near:     Physical Exam General appearance: alert, well developed, well nourished, cooperative and in no distress Head: Normocephalic, without obvious abnormality, atraumatic Respiratory: Respirations even and unlabored, normal respiratory rate Heart: rate and rhythm normal. No gallop or murmurs noted on exam  Extremities: No gross deformities Skin: Skin color, texture, turgor normal. No rashes seen  Psych: Appropriate mood and affect. Neurologic: Mental status: Alert, oriented to person, place, and time, thought content appropriate. UC Treatments / Results  Labs (all labs ordered are listed, but only abnormal results are displayed) Labs Reviewed - No data to display  EKG None  Radiology No results found.  Procedures Procedures (including  critical care time)  Medications Ordered in UC Medications - No data to display  Initial Impression / Assessment and Plan / UC Course  I have reviewed the triage vital signs and the nursing notes.  Pertinent labs & imaging results that were available during my care of  the patient were reviewed by me and considered in my medical decision making (see chart for details).    Krista Adams presents today for treatment of an acute UTI. Patient suffers from diabetes and admits poor glycemic control. Will treat today empirically for acute UTI. Urine culture pending. Recommend follow-up with PCP for management of diabetes and hypertension. Patient verbalized understanding and agreement of plan. Final Clinical Impressions(s) / UC Diagnoses   Final diagnoses:  Lower urinary tract infectious disease  Type 2 diabetes mellitus with other specified complication, unspecified whether long term insulin use ([place])  Essential hypertension     Discharge Instructions     Please follow-up with PCP for better management of diabetes along with achieving better blood pressure control. Return here for follow-up for with PCP if symptoms of UTI do not resolve or if they worsen.    ED Prescriptions    Medication Sig Dispense Auth. Provider   cephALEXin (KEFLEX) 500 MG capsule Take 1 capsule (500 mg total) by mouth 2 (two) times daily for 10 days. 20 capsule Scot Jun, FNP   phenazopyridine (PYRIDIUM) 200 MG tablet Take 1 tablet (200 mg total) by mouth 3 (three) times daily as needed for pain. 10 tablet Scot Jun, FNP     Controlled Substance Prescriptions [place] Controlled Substance Registry consulted? Not Applicable   Scot Jun, [place] 05/23/19 2151

## 2019-05-23 NOTE — ED Triage Notes (Signed)
Pt states she has pressure when voiding and she has lower back pain.

## 2020-01-12 ENCOUNTER — Ambulatory Visit: Payer: BC Managed Care – PPO | Admitting: Nurse Practitioner

## 2020-01-12 ENCOUNTER — Other Ambulatory Visit: Payer: Self-pay

## 2020-01-12 ENCOUNTER — Encounter: Payer: Self-pay | Admitting: Nurse Practitioner

## 2020-01-12 VITALS — BP 170/110 | HR 84 | Temp 98.0°F | Ht 64.0 in | Wt 274.6 lb

## 2020-01-12 DIAGNOSIS — Z9119 Patient's noncompliance with other medical treatment and regimen: Secondary | ICD-10-CM

## 2020-01-12 DIAGNOSIS — I10 Essential (primary) hypertension: Secondary | ICD-10-CM

## 2020-01-12 DIAGNOSIS — Z9114 Patient's other noncompliance with medication regimen: Secondary | ICD-10-CM

## 2020-01-12 DIAGNOSIS — Z23 Encounter for immunization: Secondary | ICD-10-CM

## 2020-01-12 DIAGNOSIS — M546 Pain in thoracic spine: Secondary | ICD-10-CM

## 2020-01-12 DIAGNOSIS — E1165 Type 2 diabetes mellitus with hyperglycemia: Secondary | ICD-10-CM

## 2020-01-12 DIAGNOSIS — G8929 Other chronic pain: Secondary | ICD-10-CM

## 2020-01-12 DIAGNOSIS — Z6841 Body Mass Index (BMI) 40.0 and over, adult: Secondary | ICD-10-CM

## 2020-01-12 DIAGNOSIS — Z794 Long term (current) use of insulin: Secondary | ICD-10-CM

## 2020-01-12 LAB — POCT URINALYSIS DIPSTICK
Bilirubin, UA: NEGATIVE
Blood, UA: NEGATIVE
Glucose, UA: POSITIVE — AB
Leukocytes, UA: NEGATIVE
Nitrite, UA: NEGATIVE
Protein, UA: NEGATIVE
Spec Grav, UA: >=1.03 — AB (ref 1.010–1.025)
Urobilinogen, UA: 0.2 E.U./dL
pH, UA: 5.5 (ref 5.0–8.0)

## 2020-01-12 LAB — POCT UA - MICROALBUMIN
Creatinine, POC: 200 mg/dL
Microalbumin Ur, POC: 80 mg/L

## 2020-01-12 MED ORDER — RYBELSUS 7 MG PO TABS: 1.0000 | ORAL_TABLET | Freq: Every day | ORAL | 2 refills | Status: DC

## 2020-01-12 MED ORDER — BLOOD GLUCOSE MONITOR KIT: PACK | 0 refills | Status: AC

## 2020-01-12 MED ORDER — OLMESARTAN MEDOXOMIL-HCTZ 20-12.5 MG PO TABS: 1.0000 | ORAL_TABLET | Freq: Every day | ORAL | 2 refills | Status: DC

## 2020-01-12 MED ORDER — TRESIBA FLEXTOUCH 100 UNIT/ML ~~LOC~~ SOPN: 20.0000 [IU] | PEN_INJECTOR | Freq: Every day | SUBCUTANEOUS | 1 refills | Status: DC

## 2020-01-12 MED ORDER — KETOROLAC TROMETHAMINE 60 MG/2ML IM SOLN
60.0000 mg | Freq: Once | INTRAMUSCULAR | Status: AC
  Administered 2020-01-12: 60 mg via INTRAMUSCULAR

## 2020-01-12 MED ORDER — METFORMIN HCL ER 500 MG PO TB24: 500.0000 mg | ORAL_TABLET | Freq: Every day | ORAL | 2 refills | Status: DC

## 2020-01-12 NOTE — Progress Notes (Signed)
This visit occurred during the SARS-CoV-2 public health emergency.  Safety protocols were in place, including screening questions prior to the visit, additional usage of staff PPE, and extensive cleaning of exam room while observing appropriate contact time as indicated for disinfecting solutions.  Subjective:     Patient ID: Krista Adams , female    DOB: October 26, 1986 , 34 y.o.   MRN: [id number]   Chief Complaint  Patient presents with  . Establish Care    back pain    HPI  Here to re-establish care had been seen years ago when she was 15 years.  She has not seen a PCP for approximately 2 years or more. She has an irregular menstrual cycle and she is interested in having kids. She is working as a Hydrographic surveyor. Married.    PMH - hypertension and diabetes (34 years old) - insulin dependent she has not been taking good care of herself.  History of pneumonia.    [facility] - mother - hypertension, DM  Father - HTN, cerebral aneurysm and enlarged (15 years old). 6 brothers and 3 sisters - hypertension, diabetes, seizures.  On her fathers side - breast cancer (paternal uncle with cancer), paternal grandmother (breast cancer) and paternal aunt with breast cancer.     Diabetes She presents for her follow-up diabetic visit. She has type 2 diabetes mellitus. There are no hypoglycemic associated symptoms. Pertinent negatives for hypoglycemia include no dizziness or headaches. Pertinent negatives for diabetes include no blurred vision, no chest pain, no fatigue, no polydipsia, no polyphagia, no polyuria and no weight loss. There are no hypoglycemic complications. There are no diabetic complications. Risk factors for coronary artery disease include diabetes mellitus, hypertension, obesity and family history. When asked about current treatments, none (she is taking 70/30 15 units am and 25 units at night) were reported. She is compliant with treatment some of the time. Home blood sugar record trend: she is  not checking her blood sugars at alll, no meter. She does not see a podiatrist.Eye exam is not current.  Hypertension This is a chronic problem. The current episode started more than 1 year ago. Pertinent negatives include no blurred vision, chest pain, headaches or palpitations. There are no compliance problems.  There is no history of angina. There is no history of chronic renal disease.  Back Pain This is a new problem. The current episode started 1 to 4 weeks ago. The problem occurs 2 to 4 times per day. The pain is present in the lumbar spine. The quality of the pain is described as aching. The pain does not radiate. The pain is at a severity of 8/10. The pain is the same all the time. The symptoms are aggravated by standing and sitting (walking for long periods). Pertinent negatives include no abdominal pain, chest pain, dysuria, headaches, paresis, paresthesias or weight loss.    Past Medical History:  Diagnosis Date  . Anovulatory (dysfunctional uterine) bleeding 03/13/2012  . Anxiety   . Depression   . Depression with anxiety 03/18/2012  . Diabetes mellitus without complication ([place])   . DM (diabetes mellitus), type 2, uncontrolled ([place]) 05/06/2008   Qualifier: Diagnosis of  By: Drue Flirt  MD, Merrily Brittle    . Essential hypertension, benign 05/06/2008   Qualifier: Diagnosis of  By: Jamal Collin MD, Ovid Curd    . Morbid obesity ([place]) 05/06/2008   Qualifier: Diagnosis of  By: Jamal Collin MD, Ovid Curd       Family History  Problem Relation Age of Onset  .  Diabetes Mother   . Hypertension Mother   . Diabetes Maternal Aunt   . Diabetes Maternal Grandmother   . Hypertension Father      Current Outpatient Medications:  .  insulin NPH-regular Human (70-30) 100 UNIT/ML injection, Inject into the skin., Disp: , Rfl:    No Known Allergies   Review of Systems  Constitutional: Negative for diaphoresis, fatigue and weight loss.  Eyes: Negative for blurred vision.  Respiratory: Negative.   Cardiovascular:  Negative for chest pain, palpitations and leg swelling.  Gastrointestinal: Negative for abdominal pain.  Endocrine: Negative for polydipsia, polyphagia and polyuria.  Genitourinary: Negative for dysuria.  Musculoskeletal: Positive for back pain. Negative for arthralgias.  Neurological: Negative for dizziness, headaches and paresthesias.  Psychiatric/Behavioral: Negative.      Today's Vitals   01/12/20 1427  BP: (!) 170/110  Pulse: 84  Temp: 98 F (36.7 C)  TempSrc: Oral  Weight: 274 lb 9.6 oz (124.6 kg)  Height: '5\' 4"'$  (1.626 m)  PainSc: 8   PainLoc: Back   Body mass index is 47.13 kg/m.   Objective:  Physical Exam      Assessment And Plan:     1. Essential hypertension, benign  Blood pressure is elevated, will restart her on benicar  EKG done with NSR  Will check Kidney functions  Encouraged to avoid high salt foods and increase physical activity  She will return in one week for nurse visit blood pressure check - POCT Urinalysis Dipstick (81002) - EKG 12-Lead - POCT UA - Microalbumin - olmesartan-hydrochlorothiazide (BENICAR HCT) 20-12.5 MG tablet; Take 1 tablet by mouth daily.  Dispense: 30 tablet; Refill: 2 - COMPLETE METABOLIC PANEL WITH GFR  2. Type 2 diabetes mellitus with hyperglycemia, with long-term current use of insulin (HCC)  She has not been consistent with her insulin and checking her blood sugar  She is to start checking her blood sugar daily at least 2 times a day  Will restart her on metformin and start tresiba   Will refer to diabetic educator to help with education - Ambulatory referral to Ophthalmology - blood glucose meter kit and supplies KIT; Dispense based on patient and insurance preference. Use up to four times daily as directed. (FOR ICD-9 250.00, 250.01).  Dispense: 1 each; Refill: 0 - metFORMIN (GLUCOPHAGE XR) 500 MG 24 hr tablet; Take 1 tablet (500 mg total) by mouth daily with breakfast.  Dispense: 30 tablet; Refill: 2 -  Semaglutide (RYBELSUS) 7 MG TABS; Take 1 tablet by mouth daily. Take 30 minutes before breakfast  Dispense: 30 tablet; Refill: 2 - insulin degludec (TRESIBA FLEXTOUCH) 100 UNIT/ML SOPN FlexTouch Pen; Inject 0.2 mLs (20 Units total) into the skin daily.  Dispense: 15 pen; Refill: 1 - CBC - Hemoglobin A1c - Lipid panel - TSH - COMPLETE METABOLIC PANEL WITH GFR  3. Morbid obesity ([place])  Reports has lost approximately 80 lbs but would like to lose more as she is interested in having children  4. Chronic midline thoracic back pain  Reports history of back pain  Will treat with toradol if not better will consider anxray - ketorolac (TORADOL) injection 60 mg  5. Encounter for immunization  Influenza vaccine given in office  Advised to take Tylenol as needed for muscle aches or fever - Flu Vaccine QUAD 6+ mos PF IM (Fluarix Quad PF)   [name], FNP    THE PATIENT IS ENCOURAGED TO PRACTICE SOCIAL DISTANCING DUE TO THE COVID-19 PANDEMIC.

## 2020-01-12 NOTE — Patient Instructions (Signed)

## 2020-01-13 LAB — LIPID PANEL
Cholesterol: 177 mg/dL (ref <200)
HDL: 41 mg/dL — ABNORMAL LOW (ref >=50)
LDL Cholesterol (Calc): 109 mg/dL (calc) — ABNORMAL HIGH
Non-HDL Cholesterol (Calc): 136 mg/dL (calc) — ABNORMAL HIGH (ref <130)
Total CHOL/HDL Ratio: 4.3 (calc) (ref <5.0)
Triglycerides: 157 mg/dL — ABNORMAL HIGH (ref <150)

## 2020-01-13 LAB — COMPLETE METABOLIC PANEL WITH GFR
AG Ratio: 1.5 (calc) (ref 1.0–2.5)
ALT: 4 U/L — ABNORMAL LOW (ref 6–29)
AST: 10 U/L (ref 10–30)
Albumin: 4 g/dL (ref 3.6–5.1)
Alkaline phosphatase (APISO): 107 U/L (ref 31–125)
BUN: 13 mg/dL (ref 7–25)
CO2: 24 mmol/L (ref 20–32)
Calcium: 9.2 mg/dL (ref 8.6–10.2)
Chloride: 105 mmol/L (ref 98–110)
Creat: 0.68 mg/dL (ref 0.50–1.10)
GFR, Est African American: 131 mL/min/{1.73_m2} (ref >=60)
GFR, Est Non African American: 113 mL/min/{1.73_m2} (ref >=60)
Globulin: 2.7 g/dL (calc) (ref 1.9–3.7)
Glucose, Bld: 247 mg/dL — ABNORMAL HIGH (ref 65–99)
Potassium: 4.1 mmol/L (ref 3.5–5.3)
Sodium: 139 mmol/L (ref 135–146)
Total Bilirubin: 0.6 mg/dL (ref 0.2–1.2)
Total Protein: 6.7 g/dL (ref 6.1–8.1)

## 2020-01-13 LAB — HEMOGLOBIN A1C
Hgb A1c MFr Bld: 11.4 % of total Hgb — ABNORMAL HIGH (ref <5.7)
Mean Plasma Glucose: 280 (calc)
eAG (mmol/L): 15.5 (calc)

## 2020-01-13 LAB — CBC
HCT: 44.3 % (ref 35.0–45.0)
Hemoglobin: 14.4 g/dL (ref 11.7–15.5)
MCH: 29 pg (ref 27.0–33.0)
MCHC: 32.5 g/dL (ref 32.0–36.0)
MCV: 89.3 fL (ref 80.0–100.0)
MPV: 11.7 fL (ref 7.5–12.5)
Platelets: 244 10*3/uL (ref 140–400)
RBC: 4.96 10*6/uL (ref 3.80–5.10)
RDW: 12.6 % (ref 11.0–15.0)
WBC: 4.4 10*3/uL (ref 3.8–10.8)

## 2020-01-13 LAB — TSH: TSH: 2.12 mIU/L

## 2020-01-19 ENCOUNTER — Other Ambulatory Visit: Payer: Self-pay

## 2020-01-19 ENCOUNTER — Ambulatory Visit: Payer: BC Managed Care – PPO

## 2020-01-19 VITALS — BP 158/92

## 2020-01-19 DIAGNOSIS — I10 Essential (primary) hypertension: Secondary | ICD-10-CM

## 2020-01-19 NOTE — Progress Notes (Signed)
Pt came for a blood pressure check for starting olmesartan-hctz20-12.5. pt blood pressure is better but still high. Pt stated that she has been stressed out from not having power so we are going to wait a week and recheck.

## 2020-01-26 ENCOUNTER — Other Ambulatory Visit: Payer: Self-pay

## 2020-01-26 ENCOUNTER — Ambulatory Visit: Payer: BC Managed Care – PPO

## 2020-01-26 VITALS — BP 142/88 | HR 62 | Temp 97.7°F | Ht 64.0 in | Wt 266.0 lb

## 2020-01-26 DIAGNOSIS — I10 Essential (primary) hypertension: Secondary | ICD-10-CM

## 2020-01-26 NOTE — Progress Notes (Signed)
Pt presents today for b/p check 142/88 she has been taking her medications as directed  Per JM: she is good to go and will need to make an appt for the other issues

## 2020-02-11 ENCOUNTER — Telehealth: Payer: Self-pay

## 2020-02-11 NOTE — Telephone Encounter (Signed)
I left pt v/m to call the office I wanted to see if she was still having nausea and vomiting and if she did her pregnancy test. Krista Adams

## 2020-02-23 ENCOUNTER — Ambulatory Visit: Payer: Self-pay | Admitting: Nurse Practitioner

## 2020-03-09 ENCOUNTER — Ambulatory Visit: Payer: BC Managed Care – PPO | Admitting: Nurse Practitioner

## 2020-03-09 ENCOUNTER — Other Ambulatory Visit: Payer: Self-pay

## 2020-03-09 ENCOUNTER — Encounter: Payer: Self-pay | Admitting: Nurse Practitioner

## 2020-03-09 VITALS — BP 140/88 | HR 86 | Temp 97.4°F | Ht 64.4 in | Wt 256.4 lb

## 2020-03-09 DIAGNOSIS — K219 Gastro-esophageal reflux disease without esophagitis: Secondary | ICD-10-CM | POA: Diagnosis not present

## 2020-03-09 DIAGNOSIS — E1165 Type 2 diabetes mellitus with hyperglycemia: Secondary | ICD-10-CM

## 2020-03-09 DIAGNOSIS — Z6841 Body Mass Index (BMI) 40.0 and over, adult: Secondary | ICD-10-CM

## 2020-03-09 DIAGNOSIS — Z9189 Other specified personal risk factors, not elsewhere classified: Secondary | ICD-10-CM

## 2020-03-09 DIAGNOSIS — Z794 Long term (current) use of insulin: Secondary | ICD-10-CM

## 2020-03-09 DIAGNOSIS — I10 Essential (primary) hypertension: Secondary | ICD-10-CM

## 2020-03-09 DIAGNOSIS — F419 Anxiety disorder, unspecified: Secondary | ICD-10-CM

## 2020-03-09 DIAGNOSIS — R5383 Other fatigue: Secondary | ICD-10-CM | POA: Diagnosis not present

## 2020-03-09 DIAGNOSIS — R110 Nausea: Secondary | ICD-10-CM

## 2020-03-09 MED ORDER — OMEPRAZOLE 20 MG PO CPDR: 20.0000 mg | DELAYED_RELEASE_CAPSULE | Freq: Every day | ORAL | 2 refills | Status: DC

## 2020-03-09 MED ORDER — MAGNESIUM 200 MG PO TABS: ORAL_TABLET | ORAL | 1 refills | Status: DC

## 2020-03-09 MED ORDER — ONDANSETRON HCL 4 MG PO TABS: 4.0000 mg | ORAL_TABLET | Freq: Three times a day (TID) | ORAL | 1 refills | Status: DC | PRN

## 2020-03-09 NOTE — Progress Notes (Signed)
This visit occurred during the SARS-CoV-2 public health emergency.  Safety protocols were in place, including screening questions prior to the visit, additional usage of staff PPE, and extensive cleaning of exam room while observing appropriate contact time as indicated for disinfecting solutions.  Subjective:     Patient ID: Krista Adams , female    DOB: 1986/08/24 , 34 y.o.   MRN: [id number]   Chief Complaint  Patient presents with  . Diabetes    HPI  Wt Readings from Last 3 Encounters: 03/09/20 : 256 lb 6.4 oz (116.3 kg) 01/26/20 : 266 lb (120.7 kg) 01/12/20 : 274 lb 9.6 oz (124.6 kg)   She has a poor appetite, increased fatigue.  She is having vomiting almost daily.  She reports having nausea for years.  Not as bad as it has been since getting her blood sugar under control.  She does not take a probiotic daily.  She feels her mood is down.  She is not drinking a lot of water.  She is not thirsty and has to force herself to eat and drink.  Rybelsus is at 7 mg.    She reports at times she is irritable and agitated.  Patient's last menstrual period was 02/20/2020. She is planning to get the covid vaccine  Diabetes She presents for her follow-up diabetic visit. She has type 2 diabetes mellitus. Her disease course has been stable. There are no hypoglycemic associated symptoms. Pertinent negatives for hypoglycemia include no dizziness or headaches. There are no diabetic associated symptoms. Pertinent negatives for diabetes include no chest pain, no fatigue, no polydipsia, no polyphagia and no polyuria. There are no hypoglycemic complications. There are no diabetic complications. Risk factors for coronary artery disease include diabetes mellitus, obesity and sedentary lifestyle. Current diabetic treatment includes oral agent (dual therapy). When asked about meal planning, she reported none. She rarely participates in exercise. (Blood sugar 140-180 ) An ACE inhibitor/angiotensin II receptor  blocker is being taken. She does not see a podiatrist.Eye exam is not current.     Past Medical History:  Diagnosis Date  . Anovulatory (dysfunctional uterine) bleeding 03/13/2012  . Anxiety   . Depression   . Depression with anxiety 03/18/2012  . Diabetes mellitus without complication ([place])   . DM (diabetes mellitus), type 2, uncontrolled ([place]) 05/06/2008   Qualifier: Diagnosis of  By: Drue Flirt  MD, Merrily Brittle    . Essential hypertension, benign 05/06/2008   Qualifier: Diagnosis of  By: Jamal Collin MD, Ovid Curd    . Morbid obesity ([place]) 05/06/2008   Qualifier: Diagnosis of  By: Jamal Collin MD, Ovid Curd       Family History  Problem Relation Age of Onset  . Diabetes Mother   . Hypertension Mother   . Diabetes Maternal Aunt   . Diabetes Maternal Grandmother   . Hypertension Father      Current Outpatient Medications:  .  blood glucose meter kit and supplies KIT, Dispense based on patient and insurance preference. Use up to four times daily as directed. (FOR ICD-9 250.00, 250.01)., Disp: 1 each, Rfl: 0 .  insulin degludec (TRESIBA FLEXTOUCH) 100 UNIT/ML SOPN FlexTouch Pen, Inject 0.2 mLs (20 Units total) into the skin daily., Disp: 15 pen, Rfl: 1 .  insulin NPH-regular Human (70-30) 100 UNIT/ML injection, Inject into the skin., Disp: , Rfl:  .  metFORMIN (GLUCOPHAGE XR) 500 MG 24 hr tablet, Take 1 tablet (500 mg total) by mouth daily with breakfast., Disp: 30 tablet, Rfl: 2 .  olmesartan-hydrochlorothiazide (BENICAR HCT)  20-12.5 MG tablet, Take 1 tablet by mouth daily., Disp: 30 tablet, Rfl: 2 .  Semaglutide (RYBELSUS) 7 MG TABS, Take 1 tablet by mouth daily. Take 30 minutes before breakfast, Disp: 30 tablet, Rfl: 2   No Known Allergies   Review of Systems  Constitutional: Negative.  Negative for diaphoresis and fatigue.  Respiratory: Negative.  Negative for cough.   Cardiovascular: Negative.  Negative for chest pain, palpitations and leg swelling.  Gastrointestinal: Positive for nausea. Negative for  abdominal pain.  Endocrine: Negative for polydipsia, polyphagia and polyuria.  Genitourinary: Negative for dysuria.  Musculoskeletal: Positive for back pain. Negative for arthralgias.  Neurological: Negative for dizziness and headaches.  Psychiatric/Behavioral: Negative.      Today's Vitals   03/09/20 1525  BP: 140/88  Pulse: 86  Temp: (!) 97.4 F (36.3 C)  Weight: 256 lb 6.4 oz (116.3 kg)  Height: 5' 4.4" (1.636 m)   Body mass index is 43.47 kg/m.   Objective:  Physical Exam Constitutional:      General: She is not in acute distress.    Appearance: Normal appearance. She is well-developed. She is obese.  Cardiovascular:     Rate and Rhythm: Normal rate and regular rhythm.     Pulses: Normal pulses.     Heart sounds: Normal heart sounds. No murmur.  Pulmonary:     Effort: Pulmonary effort is normal.     Breath sounds: Normal breath sounds.  Chest:     Chest wall: No tenderness.  Musculoskeletal:        General: Normal range of motion.  Skin:    General: Skin is warm and dry.     Capillary Refill: Capillary refill takes less than 2 seconds.  Neurological:     General: No focal deficit present.     Mental Status: She is alert and oriented to person, place, and time.  Psychiatric:        Mood and Affect: Mood normal.        Behavior: Behavior normal.        Thought Content: Thought content normal.        Judgment: Judgment normal.         Assessment And Plan:     1. Type 2 diabetes mellitus with hyperglycemia, with long-term current use of insulin (HCC)  Chronic, reports her blood sugars are improving   She is having some nausea on the Rybelsus  Will check HgbA1c - CMP14+EGFR - Lipid panel - Hemoglobin A1c  2. Essential hypertension, benign . B/P is controlled.  . CMP ordered to check renal function.  . The importance of regular exercise and dietary modification was stressed to the patient.  - CMP14+EGFR  3. Fatigue, unspecified type  May be  related to her poorly controlled diabetes vs metabolic cause - Vitamin V78  4. Morbid obesity ([place])  Long discussion about exercising more this will also help with her mood.   5. Gastroesophageal reflux disease without esophagitis  This could be the cause of her persistent nausea - omeprazole (PRILOSEC) 20 MG capsule; Take 1 capsule (20 mg total) by mouth daily.  Dispense: 30 capsule; Refill: 2  6. Nausea  May be related to gastoparesis since her diabetes is poorly controlled or related to gLP1 use - ondansetron (ZOFRAN) 4 MG tablet; Take 1 tablet (4 mg total) by mouth every 8 (eight) hours as needed for nausea or vomiting.  Dispense: 30 tablet; Refill: 1  7. Anxiety  Discussed with her the importance to  take deep breaths and try to keep her stress levels down especially if wanting children as stress can cause difficulty with becoming pregnant    [name], FNP    THE PATIENT IS ENCOURAGED TO PRACTICE SOCIAL DISTANCING DUE TO THE COVID-19 PANDEMIC.

## 2020-03-10 LAB — LIPID PANEL
Cholesterol: 153 mg/dL
HDL: 34 mg/dL — ABNORMAL LOW
LDL Cholesterol (Calc): 92 mg/dL
Non-HDL Cholesterol (Calc): 119 mg/dL
Total CHOL/HDL Ratio: 4.5 (calc)
Triglycerides: 168 mg/dL — ABNORMAL HIGH

## 2020-03-10 LAB — COMPLETE METABOLIC PANEL WITH GFR
AG Ratio: 1.4 (calc) (ref 1.0–2.5)
ALT: 5 U/L — ABNORMAL LOW (ref 6–29)
AST: 10 U/L (ref 10–30)
Albumin: 4.1 g/dL (ref 3.6–5.1)
Alkaline phosphatase (APISO): 61 U/L (ref 31–125)
BUN: 10 mg/dL (ref 7–25)
CO2: 27 mmol/L (ref 20–32)
Calcium: 9.3 mg/dL (ref 8.6–10.2)
Chloride: 102 mmol/L (ref 98–110)
Creat: 0.72 mg/dL (ref 0.50–1.10)
GFR, Est African American: 126 mL/min/{1.73_m2} (ref >=60)
GFR, Est Non African American: 108 mL/min/{1.73_m2} (ref >=60)
Globulin: 2.9 g/dL (calc) (ref 1.9–3.7)
Glucose, Bld: 116 mg/dL — ABNORMAL HIGH (ref 65–99)
Potassium: 3.8 mmol/L (ref 3.5–5.3)
Sodium: 139 mmol/L (ref 135–146)
Total Bilirubin: 0.5 mg/dL (ref 0.2–1.2)
Total Protein: 7 g/dL (ref 6.1–8.1)

## 2020-03-10 LAB — HEMOGLOBIN A1C
Hgb A1c MFr Bld: 9.8 % of total Hgb — ABNORMAL HIGH (ref <5.7)
Mean Plasma Glucose: 235 (calc)
eAG (mmol/L): 13 (calc)

## 2020-03-10 LAB — VITAMIN B12: Vitamin B-12: 846 pg/mL (ref 200–1100)

## 2020-04-06 ENCOUNTER — Other Ambulatory Visit: Payer: Self-pay | Admitting: Nurse Practitioner

## 2020-04-06 DIAGNOSIS — I10 Essential (primary) hypertension: Secondary | ICD-10-CM

## 2020-04-06 DIAGNOSIS — E1165 Type 2 diabetes mellitus with hyperglycemia: Secondary | ICD-10-CM

## 2020-04-06 DIAGNOSIS — Z794 Long term (current) use of insulin: Secondary | ICD-10-CM

## 2020-04-25 ENCOUNTER — Other Ambulatory Visit: Payer: Self-pay | Admitting: Nurse Practitioner

## 2020-04-25 DIAGNOSIS — E1165 Type 2 diabetes mellitus with hyperglycemia: Secondary | ICD-10-CM

## 2020-05-03 ENCOUNTER — Other Ambulatory Visit: Payer: Self-pay

## 2020-05-03 ENCOUNTER — Telehealth: Payer: Self-pay

## 2020-05-03 MED ORDER — FLUCONAZOLE 150 MG PO TABS
ORAL_TABLET | ORAL | 0 refills | Status: DC
Start: 2020-05-03 — End: 2020-06-08

## 2020-05-03 NOTE — Telephone Encounter (Signed)
Patient called stating she was given an antibiotic by her dentist and it is causing her a yeast infection. I returned her call and advised her we sent her a Rx for diflucan. YL,RMA

## 2020-06-02 ENCOUNTER — Other Ambulatory Visit (HOSPITAL_COMMUNITY): Payer: Self-pay | Admitting: Gastroenterology

## 2020-06-02 DIAGNOSIS — R1011 Right upper quadrant pain: Secondary | ICD-10-CM

## 2020-06-08 ENCOUNTER — Encounter: Payer: Self-pay | Admitting: Nurse Practitioner

## 2020-06-08 ENCOUNTER — Ambulatory Visit: Payer: BC Managed Care – PPO | Admitting: Nurse Practitioner

## 2020-06-08 ENCOUNTER — Other Ambulatory Visit: Payer: Self-pay

## 2020-06-08 VITALS — BP 130/74 | HR 64 | Temp 98.3°F | Ht 64.4 in | Wt 251.0 lb

## 2020-06-08 DIAGNOSIS — L819 Disorder of pigmentation, unspecified: Secondary | ICD-10-CM

## 2020-06-08 DIAGNOSIS — Z1159 Encounter for screening for other viral diseases: Secondary | ICD-10-CM

## 2020-06-08 DIAGNOSIS — I10 Essential (primary) hypertension: Secondary | ICD-10-CM | POA: Diagnosis not present

## 2020-06-08 DIAGNOSIS — R110 Nausea: Secondary | ICD-10-CM | POA: Diagnosis not present

## 2020-06-08 DIAGNOSIS — E1165 Type 2 diabetes mellitus with hyperglycemia: Secondary | ICD-10-CM

## 2020-06-08 DIAGNOSIS — R21 Rash and other nonspecific skin eruption: Secondary | ICD-10-CM

## 2020-06-08 DIAGNOSIS — Z113 Encounter for screening for infections with a predominantly sexual mode of transmission: Secondary | ICD-10-CM

## 2020-06-08 DIAGNOSIS — Z794 Long term (current) use of insulin: Secondary | ICD-10-CM

## 2020-06-08 MED ORDER — TRESIBA FLEXTOUCH 100 UNIT/ML ~~LOC~~ SOPN: 35.0000 [IU] | PEN_INJECTOR | Freq: Every day | SUBCUTANEOUS | 1 refills | Status: DC

## 2020-06-08 MED ORDER — NYSTATIN-TRIAMCINOLONE 100000-0.1 UNIT/GM-% EX OINT: 1.0000 "application " | TOPICAL_OINTMENT | Freq: Two times a day (BID) | CUTANEOUS | 2 refills | Status: DC

## 2020-06-08 NOTE — Progress Notes (Signed)
This visit occurred during the SARS-CoV-2 public health emergency.  Safety protocols were in place, including screening questions prior to the visit, additional usage of staff PPE, and extensive cleaning of exam room while observing appropriate contact time as indicated for disinfecting solutions.  Subjective:     Patient ID: Krista Adams , female    DOB: 1986-07-31 , 34 y.o.   MRN: [id number]   Chief Complaint  Patient presents with  . Diabetes    HPI  She present today for her 3 month diabetes follow up. She reports that she is checking her CBG's and they are running 150 in the a.m and 140 in the mid afternoon. She reports that her rybelsus makes her nausea and she is worried that 2 bottles of water. She feels that she is forcing her liquids kidney function. She reports also associated nausea but reports eating small amounts. She is walking after work 20-30 minutes three times per week. She reports tiredness in the morning after awaking. She gets about three hours per night at times and if she is hurting in her legs and has cramping at night. She has taken Neurontin in the past. She had the COVID vaccine in May and she has appointment coming for OBGYN and eye exam. She wants the Hep C and HIV today.   Wt Readings from Last 3 Encounters: 06/08/20 : 251 lb (113.9 kg) 03/09/20 : 256 lb 6.4 oz (116.3 kg) 01/26/20 : 266 lb (120.7 kg)  Diabetes She presents for her follow-up diabetic visit. She has type 2 diabetes mellitus. Her disease course has been improving. There are no hypoglycemic associated symptoms. Pertinent negatives for hypoglycemia include no dizziness or headaches. Associated symptoms include fatigue. Pertinent negatives for diabetes include no chest pain, no polydipsia, no polyphagia and no polyuria. There are no hypoglycemic complications. There are no diabetic complications. Risk factors for coronary artery disease include diabetes mellitus, obesity and sedentary lifestyle.  Current diabetic treatment includes oral agent (dual therapy). She is compliant with treatment all of the time. She is following a generally unhealthy diet. When asked about meal planning, she reported none. She has not had a previous visit with a dietitian. She rarely participates in exercise. There is no change in her home blood glucose trend. (Blood sugar 111 - 150. ) An ACE inhibitor/angiotensin II receptor blocker is being taken. She does not see a podiatrist.Eye exam is not current.     Past Medical History:  Diagnosis Date  . Anovulatory (dysfunctional uterine) bleeding 03/13/2012  . Anxiety   . Depression   . Depression with anxiety 03/18/2012  . Diabetes mellitus without complication ([place])   . DM (diabetes mellitus), type 2, uncontrolled ([place]) 05/06/2008   Qualifier: Diagnosis of  By: Drue Flirt  MD, Merrily Brittle    . Essential hypertension, benign 05/06/2008   Qualifier: Diagnosis of  By: Jamal Collin MD, Ovid Curd    . Morbid obesity ([place]) 05/06/2008   Qualifier: Diagnosis of  By: Jamal Collin MD, Ovid Curd       Family History  Problem Relation Age of Onset  . Diabetes Mother   . Hypertension Mother   . Diabetes Maternal Aunt   . Diabetes Maternal Grandmother   . Hypertension Father      Current Outpatient Medications:  .  blood glucose meter kit and supplies KIT, Dispense based on patient and insurance preference. Use up to four times daily as directed. (FOR ICD-9 250.00, 250.01)., Disp: 1 each, Rfl: 0 .  insulin degludec (TRESIBA FLEXTOUCH) 100  UNIT/ML FlexTouch Pen, Inject 0.35 mLs (35 Units total) into the skin daily., Disp: 15 pen, Rfl: 1 .  Magnesium 200 MG TABS, Take 1 tablet by mouth daily with evening meal, Disp: 90 tablet, Rfl: 1 .  metFORMIN (GLUCOPHAGE-XR) 500 MG 24 hr tablet, TAKE 1 TABLET BY MOUTH EVERY DAY WITH BREAKFAST, Disp: 30 tablet, Rfl: 2 .  ondansetron (ZOFRAN) 4 MG tablet, Take 1 tablet (4 mg total) by mouth every 8 (eight) hours as needed for nausea or vomiting., Disp: 30 tablet,  Rfl: 1 .  RYBELSUS 7 MG TABS, TAKE 1 TABLET BY MOUTH DAILY. TAKE 30 MINUTES BEFORE BREAKFAST, Disp: 90 tablet, Rfl: 0 .  labetalol (NORMODYNE) 100 MG tablet, Take 1 tablet (100 mg total) by mouth once for 1 dose., Disp: 90 tablet, Rfl: 0 .  nystatin-triamcinolone ointment (MYCOLOG), Apply 1 application topically 2 (two) times daily., Disp: 30 g, Rfl: 2 .  omeprazole (PRILOSEC) 20 MG capsule, Take 1 capsule (20 mg total) by mouth daily. (Patient not taking: Reported on 06/08/2020), Disp: 30 capsule, Rfl: 2   No Known Allergies   Review of Systems  Constitutional: Positive for fatigue.  Respiratory: Negative.   Cardiovascular: Negative.  Negative for chest pain, palpitations and leg swelling.  Gastrointestinal: Positive for nausea. Negative for abdominal pain.  Endocrine: Negative for polydipsia, polyphagia and polyuria.  Genitourinary: Negative for dysuria.  Musculoskeletal: Negative.   Skin: Positive for rash.       Has hives in hot and cold weather  Neurological: Negative for dizziness and headaches.  Psychiatric/Behavioral: Negative.      Today's Vitals   06/08/20 1549  BP: 130/74  Pulse: 64  Temp: 98.3 F (36.8 C)  TempSrc: Oral  Weight: 251 lb (113.9 kg)  Height: 5' 4.4" (1.636 m)  PainSc: 8   PainLoc: Abdomen   Body mass index is 42.55 kg/m.   Objective:  Physical Exam Constitutional:      General: She is not in acute distress.    Appearance: Normal appearance. She is well-developed. She is obese.  Neck:     Vascular: No carotid bruit.  Cardiovascular:     Rate and Rhythm: Normal rate and regular rhythm.     Pulses: Normal pulses.     Heart sounds: Normal heart sounds. No murmur heard.   Pulmonary:     Effort: Pulmonary effort is normal.     Breath sounds: Normal breath sounds. No wheezing.  Musculoskeletal:        General: Normal range of motion.  Skin:    General: Skin is warm and dry.     Capillary Refill: Capillary refill takes less than 2 seconds.      Findings: Rash present.     Comments: Right upper arm has white blotches  Neurological:     General: No focal deficit present.     Mental Status: She is alert and oriented to person, place, and time.     Cranial Nerves: No cranial nerve deficit.  Psychiatric:        Mood and Affect: Mood normal.        Behavior: Behavior normal.        Thought Content: Thought content normal.        Judgment: Judgment normal.         Assessment And Plan:    1. Type 2 diabetes mellitus with hyperglycemia, with long-term current use of insulin (HCC)  Chronic, poorly controlled  Continue with current medications, she is having nausea from  the Rybelsus but would like to continue. Encouraged to limit intake of greasy and sugary foods. Reminded if full to stop eating.   Encouraged to limit intake of sugary foods and drinks  Encouraged to increase physical activity to 150 minutes per week - insulin degludec (TRESIBA FLEXTOUCH) 100 UNIT/ML FlexTouch Pen; Inject 0.35 mLs (35 Units total) into the skin daily.  Dispense: 15 pen; Refill: 1 - COMPLETE METABOLIC PANEL WITH GFR - Hemoglobin A1c - Lipid panel  2. Essential hypertension, benign . B/P is controlled.  . CMP ordered to check renal function.  . The importance of regular exercise and dietary modification was stressed to the patient.   3. Nausea  Likely related to the Rybelsus  4. Hypopigmentation   5. Rash and nonspecific skin eruption  Has scattered erythematous areas to skin  Will treat with nystatin/triamcinolone cream - nystatin-triamcinolone ointment (MYCOLOG); Apply 1 application topically 2 (two) times daily.  Dispense: 30 g; Refill: 2  6. Encounter for hepatitis C screening test for low risk patient  Will check Hepatitis C screening due to recent recommendations to screen all adults 18 years and older - Hepatitis C antibody  7. Screening examination for STD (sexually transmitted disease)  - HIV antibody (with reflex)  She  is overwhelmed with all the referrals since coming to the office however she would like to hold off on her eye exam.  Advised with her poorly controlled diabetes it is important to have regular eye exams. Referral was made in February 2021  [name], FNP    [name], DNP, FNP-BC  I personally spent 30 minutes face-to-face and non-face-to-face in the care of this patient, which includes all pre-, intra-, and post visit time on the date of service.   THE PATIENT IS ENCOURAGED TO PRACTICE SOCIAL DISTANCING DUE TO THE COVID-19 PANDEMIC.

## 2020-06-09 ENCOUNTER — Other Ambulatory Visit: Payer: Self-pay | Admitting: Nurse Practitioner

## 2020-06-09 LAB — LIPID PANEL
Cholesterol: 141 (ref 0–200)
Cholesterol: 141 mg/dL (ref <200)
HDL: 39 (ref 35–70)
HDL: 39 mg/dL — ABNORMAL LOW (ref >=50)
LDL Cholesterol (Calc): 78 mg/dL (calc)
LDL Cholesterol: 78
Non-HDL Cholesterol (Calc): 102 mg/dL (calc) (ref <130)
Total CHOL/HDL Ratio: 3.6 (calc) (ref <5.0)
Triglycerides: 142 (ref 40–160)
Triglycerides: 142 mg/dL (ref <150)

## 2020-06-09 LAB — BASIC METABOLIC PANEL
BUN: 12 (ref 4–21)
Creatinine: 0.7 (ref 0.5–1.1)
Glucose: 91

## 2020-06-09 LAB — COMPLETE METABOLIC PANEL WITH GFR
AG Ratio: 1.4 (calc) (ref 1.0–2.5)
ALT: 5 U/L — ABNORMAL LOW (ref 6–29)
AST: 11 U/L (ref 10–30)
Albumin: 4 g/dL (ref 3.6–5.1)
Alkaline phosphatase (APISO): 65 U/L (ref 31–125)
BUN: 12 mg/dL (ref 7–25)
CO2: 25 mmol/L (ref 20–32)
Calcium: 9.2 mg/dL (ref 8.6–10.2)
Chloride: 104 mmol/L (ref 98–110)
Creat: 0.66 mg/dL (ref 0.50–1.10)
GFR, Est African American: 132 mL/min/{1.73_m2} (ref >=60)
GFR, Est Non African American: 114 mL/min/{1.73_m2} (ref >=60)
Globulin: 2.9 g/dL (calc) (ref 1.9–3.7)
Glucose, Bld: 91 mg/dL (ref 65–99)
Potassium: 3.7 mmol/L (ref 3.5–5.3)
Sodium: 137 mmol/L (ref 135–146)
Total Bilirubin: 0.3 mg/dL (ref 0.2–1.2)
Total Protein: 6.9 g/dL (ref 6.1–8.1)

## 2020-06-09 LAB — HEPATIC FUNCTION PANEL
ALT: 11 (ref 7–35)
AST: 65 — AB (ref 13–35)
Alkaline Phosphatase: 65 (ref 25–125)

## 2020-06-09 LAB — HIV ANTIBODY (ROUTINE TESTING W REFLEX)
HIV 1&2 Ab, 4th Generation: NEGATIVE
HIV 1&2 Ab, 4th Generation: NONREACTIVE

## 2020-06-09 LAB — HEPATITIS C ANTIBODY
Hepatitis C Ab: NONREACTIVE
SIGNAL TO CUT-OFF: 0.01 (ref <1.00)

## 2020-06-09 LAB — COMPREHENSIVE METABOLIC PANEL
Albumin: 4 (ref 3.5–5.0)
Calcium: 9.2 (ref 8.7–10.7)
Globulin: 2.9

## 2020-06-09 LAB — HEMOGLOBIN A1C
Hemoglobin A1C: 6.7
Hgb A1c MFr Bld: 6.7 % of total Hgb — ABNORMAL HIGH (ref <5.7)
Mean Plasma Glucose: 146 (calc)
eAG (mmol/L): 8.1 (calc)

## 2020-06-09 LAB — HOUSE ACCOUNT TRACKING

## 2020-06-09 LAB — HM HEPATITIS C SCREENING LAB: HM Hepatitis Screen: NEGATIVE

## 2020-06-15 ENCOUNTER — Encounter: Payer: Self-pay | Admitting: Nurse Practitioner

## 2020-06-16 ENCOUNTER — Other Ambulatory Visit: Payer: Self-pay

## 2020-06-16 DIAGNOSIS — I10 Essential (primary) hypertension: Secondary | ICD-10-CM

## 2020-06-16 MED ORDER — LABETALOL HCL 100 MG PO TABS: 100.0000 mg | ORAL_TABLET | Freq: Once | ORAL | 0 refills | Status: DC

## 2020-06-16 MED ORDER — LABETALOL HCL 100 MG PO TABS: 100.0000 mg | ORAL_TABLET | Freq: Once | ORAL | 2 refills | Status: DC

## 2020-06-16 NOTE — Progress Notes (Signed)
labe

## 2020-07-05 ENCOUNTER — Other Ambulatory Visit: Payer: Self-pay | Admitting: Nurse Practitioner

## 2020-07-05 DIAGNOSIS — I10 Essential (primary) hypertension: Secondary | ICD-10-CM

## 2020-07-05 DIAGNOSIS — E1165 Type 2 diabetes mellitus with hyperglycemia: Secondary | ICD-10-CM

## 2020-07-06 ENCOUNTER — Other Ambulatory Visit: Payer: Self-pay

## 2020-07-07 ENCOUNTER — Encounter: Payer: Self-pay | Admitting: Nurse Practitioner

## 2020-07-14 ENCOUNTER — Encounter (HOSPITAL_COMMUNITY)
Admission: RE | Admit: 2020-07-14 | Discharge: 2020-07-14 | Disposition: A | Discharge status: Home / Self Care | Payer: BC Managed Care – PPO | Source: Ambulatory Visit | Attending: Gastroenterology | Admitting: Gastroenterology

## 2020-07-14 ENCOUNTER — Other Ambulatory Visit: Payer: Self-pay

## 2020-07-14 ENCOUNTER — Ambulatory Visit (HOSPITAL_COMMUNITY)
Admission: RE | Admit: 2020-07-14 | Discharge: 2020-07-14 | Disposition: A | Discharge status: Home / Self Care | Payer: BC Managed Care – PPO | Source: Ambulatory Visit | Attending: Gastroenterology | Admitting: Gastroenterology

## 2020-07-14 DIAGNOSIS — R1011 Right upper quadrant pain: Secondary | ICD-10-CM | POA: Diagnosis present

## 2020-07-14 LAB — HCG, SERUM, QUALITATIVE: Preg, Serum: NEGATIVE

## 2020-07-14 MED ORDER — TECHNETIUM TC 99M MEBROFENIN IV KIT
5.0000 | PACK | Freq: Once | INTRAVENOUS | Status: AC | PRN
  Administered 2020-07-14: 5 via INTRAVENOUS

## 2020-07-20 ENCOUNTER — Other Ambulatory Visit: Payer: Self-pay | Admitting: Nurse Practitioner

## 2020-07-20 DIAGNOSIS — Z794 Long term (current) use of insulin: Secondary | ICD-10-CM

## 2020-07-20 DIAGNOSIS — E1165 Type 2 diabetes mellitus with hyperglycemia: Secondary | ICD-10-CM

## 2020-08-17 ENCOUNTER — Other Ambulatory Visit: Payer: Self-pay | Admitting: Surgery

## 2020-09-08 ENCOUNTER — Other Ambulatory Visit: Payer: Self-pay | Admitting: Nurse Practitioner

## 2020-09-08 DIAGNOSIS — I10 Essential (primary) hypertension: Secondary | ICD-10-CM

## 2020-09-15 ENCOUNTER — Other Ambulatory Visit: Payer: Self-pay | Admitting: Nurse Practitioner

## 2020-09-15 DIAGNOSIS — R110 Nausea: Secondary | ICD-10-CM

## 2020-09-20 ENCOUNTER — Other Ambulatory Visit: Payer: Self-pay

## 2020-09-20 ENCOUNTER — Encounter: Payer: Self-pay | Admitting: Nurse Practitioner

## 2020-09-20 ENCOUNTER — Ambulatory Visit: Payer: BC Managed Care – PPO | Admitting: Nurse Practitioner

## 2020-09-20 VITALS — BP 124/80 | HR 65 | Temp 98.5°F | Ht 65.6 in | Wt 246.0 lb

## 2020-09-20 DIAGNOSIS — Z0000 Encounter for general adult medical examination without abnormal findings: Secondary | ICD-10-CM | POA: Diagnosis not present

## 2020-09-20 DIAGNOSIS — Z79899 Other long term (current) drug therapy: Secondary | ICD-10-CM | POA: Diagnosis not present

## 2020-09-20 DIAGNOSIS — Z23 Encounter for immunization: Secondary | ICD-10-CM

## 2020-09-20 DIAGNOSIS — E1165 Type 2 diabetes mellitus with hyperglycemia: Secondary | ICD-10-CM | POA: Diagnosis not present

## 2020-09-20 DIAGNOSIS — Z794 Long term (current) use of insulin: Secondary | ICD-10-CM

## 2020-09-20 DIAGNOSIS — I10 Essential (primary) hypertension: Secondary | ICD-10-CM | POA: Diagnosis not present

## 2020-09-20 LAB — BASIC METABOLIC PANEL
BUN: 10 (ref 4–21)
CO2: 28 — AB (ref 13–22)
Chloride: 107 (ref 99–108)
Creatinine: 0.7 (ref 0.5–1.1)
Glucose: 83
Potassium: 4.1 (ref 3.4–5.3)
Sodium: 143 (ref 137–147)

## 2020-09-20 LAB — HEMOGLOBIN A1C: Hemoglobin A1C: 6.1

## 2020-09-20 LAB — HEPATIC FUNCTION PANEL
ALT: 3 — AB (ref 7–35)
AST: 9 — AB (ref 13–35)
Alkaline Phosphatase: 66 (ref 25–125)
Bilirubin, Total: 0.5

## 2020-09-20 LAB — COMPREHENSIVE METABOLIC PANEL
Albumin: 4 (ref 3.5–5.0)
Calcium: 9.5 (ref 8.7–10.7)
GFR calc Af Amer: 129
GFR calc non Af Amer: 111
Globulin: 2.6

## 2020-09-20 LAB — LIPID PANEL
Cholesterol: 134 (ref 0–200)
HDL: 43 (ref 35–70)
LDL Cholesterol: 77
LDl/HDL Ratio: 3.1
Triglycerides: 68 (ref 40–160)

## 2020-09-20 LAB — VITAMIN D 25 HYDROXY (VIT D DEFICIENCY, FRACTURES): Vit D, 25-Hydroxy: 36

## 2020-09-20 LAB — TSH: TSH: 1.89 (ref 0.41–5.90)

## 2020-09-20 MED ORDER — RYBELSUS 14 MG PO TABS: 1.0000 | ORAL_TABLET | Freq: Every day | ORAL | 5 refills | Status: DC

## 2020-09-20 NOTE — Patient Instructions (Addendum)
Health Maintenance, Female Adopting a healthy lifestyle and getting preventive care are important in promoting health and wellness. Ask your health care provider about:  The right schedule for you to have regular tests and exams.  Things you can do on your own to prevent diseases and keep yourself healthy. What should I know about diet, weight, and exercise? Eat a healthy diet   Eat a diet that includes plenty of vegetables, fruits, low-fat dairy products, and lean protein.  Do not eat a lot of foods that are high in solid fats, added sugars, or sodium. Maintain a healthy weight Body mass index (BMI) is used to identify weight problems. It estimates body fat based on height and weight. Your health care provider can help determine your BMI and help you achieve or maintain a healthy weight. Get regular exercise Get regular exercise. This is one of the most important things you can do for your health. Most adults should:  Exercise for at least 150 minutes each week. The exercise should increase your heart rate and make you sweat (moderate-intensity exercise).  Do strengthening exercises at least twice a week. This is in addition to the moderate-intensity exercise.  Spend less time sitting. Even light physical activity can be beneficial. Watch cholesterol and blood lipids Have your blood tested for lipids and cholesterol at 36 years of age, then have this test every 5 years. Have your cholesterol levels checked more often if:  Your lipid or cholesterol levels are high.  You are older than 36 years of age.  You are at high risk for heart disease. What should I know about cancer screening? Depending on your health history and family history, you may need to have cancer screening at various ages. This may include screening for:  Breast cancer.  Cervical cancer.  Colorectal cancer.  Skin cancer.  Lung cancer. What should I know about heart disease, diabetes, and high blood  pressure? Blood pressure and heart disease  High blood pressure causes heart disease and increases the risk of stroke. This is more likely to develop in people who have high blood pressure readings, are of African descent, or are overweight.  Have your blood pressure checked: ? Every 3-5 years if you are 18-39 years of age. ? Every year if you are 40 years old or older. Diabetes Have regular diabetes screenings. This checks your fasting blood sugar level. Have the screening done:  Once every three years after age 40 if you are at a normal weight and have a low risk for diabetes.  More often and at a younger age if you are overweight or have a high risk for diabetes. What should I know about preventing infection? Hepatitis B If you have a higher risk for hepatitis B, you should be screened for this virus. Talk with your health care provider to find out if you are at risk for hepatitis B infection. Hepatitis C Testing is recommended for:  Everyone born from 1945 through 1965.  Anyone with known risk factors for hepatitis C. Sexually transmitted infections (STIs)  Get screened for STIs, including gonorrhea and chlamydia, if: ? You are sexually active and are younger than 36 years of age. ? You are older than 36 years of age and your health care provider tells you that you are at risk for this type of infection. ? Your sexual activity has changed since you were last screened, and you are at increased risk for chlamydia or gonorrhea. Ask your health care provider if   you are at risk.  Ask your health care provider about whether you are at high risk for HIV. Your health care provider may recommend a prescription medicine to help prevent HIV infection. If you choose to take medicine to prevent HIV, you should first get tested for HIV. You should then be tested every 3 months for as long as you are taking the medicine. Pregnancy  If you are about to stop having your period (premenopausal) and  you may become pregnant, seek counseling before you get pregnant.  Take 400 to 800 micrograms (mcg) of folic acid every day if you become pregnant.  Ask for birth control (contraception) if you want to prevent pregnancy. Osteoporosis and menopause Osteoporosis is a disease in which the bones lose minerals and strength with aging. This can result in bone fractures. If you are 31 years old or older, or if you are at risk for osteoporosis and fractures, ask your health care provider if you should:  Be screened for bone loss.  Take a calcium or vitamin D supplement to lower your risk of fractures.  Be given hormone replacement therapy (HRT) to treat symptoms of menopause. Follow these instructions at home: Lifestyle  Do not use any products that contain nicotine or tobacco, such as cigarettes, e-cigarettes, and chewing tobacco. If you need help quitting, ask your health care provider.  Do not use street drugs.  Do not share needles.  Ask your health care provider for help if you need support or information about quitting drugs. Alcohol use  Do not drink alcohol if: ? Your health care provider tells you not to drink. ? You are pregnant, may be pregnant, or are planning to become pregnant.  If you drink alcohol: ? Limit how much you use to 0-1 drink a day. ? Limit intake if you are breastfeeding.  Be aware of how much alcohol is in your drink. In the [place] one drink equals one 12 oz bottle of beer (355 mL), one 5 oz glass of wine (148 mL), or one 1 oz glass of hard liquor (44 mL). General instructions  Schedule regular health, dental, and eye exams.  Stay current with your vaccines.  Tell your health care provider if: ? You often feel depressed. ? You have ever been abused or do not feel safe at home. Summary  Adopting a healthy lifestyle and getting preventive care are important in promoting health and wellness.  Follow your health care provider's instructions about healthy  diet, exercising, and getting tested or screened for diseases.  Follow your health care provider's instructions on monitoring your cholesterol and blood pressure. This information is not intended to replace advice given to you by your health care provider. Make sure you discuss any questions you have with your health care provider. Document Revised: 11/12/2018 Document Reviewed: 11/12/2018 Elsevier Patient Education  [place].  Influenza Virus Vaccine (Flucelvax) What is this medicine? INFLUENZA VIRUS VACCINE (in floo EN zuh VAHY ruhs vak SEEN) helps to reduce the risk of getting influenza also known as the flu. The vaccine only helps protect you against some strains of the flu. This medicine may be used for other purposes; ask your health care provider or pharmacist if you have questions. COMMON BRAND NAME(S): FLUCELVAX What should I tell my health care provider before I take this medicine? They need to know if you have any of these conditions:  bleeding disorder like hemophilia  fever or infection  Guillain-Barre syndrome or other neurological problems  immune system problems  infection with the human immunodeficiency virus (HIV) or AIDS  low blood platelet counts  multiple sclerosis  an unusual or allergic reaction to influenza virus vaccine, other medicines, foods, dyes or preservatives  pregnant or trying to get pregnant  breast-feeding How should I use this medicine? This vaccine is for injection into a muscle. It is given by a health care professional. A copy of Vaccine Information Statements will be given before each vaccination. Read this sheet carefully each time. The sheet may change frequently. Talk to your pediatrician regarding the use of this medicine in children. Special care may be needed. Overdosage: If you think you've taken too much of this medicine contact a poison control center or emergency room at once. Overdosage: If you think you have taken  too much of this medicine contact a poison control center or emergency room at once. NOTE: This medicine is only for you. Do not share this medicine with others. What if I miss a dose? This does not apply. What may interact with this medicine?  chemotherapy or radiation therapy  medicines that lower your immune system like etanercept, anakinra, infliximab, and adalimumab  medicines that treat or prevent blood clots like warfarin  phenytoin  steroid medicines like prednisone or cortisone  theophylline  vaccines This list may not describe all possible interactions. Give your health care provider a list of all the medicines, herbs, non-prescription drugs, or dietary supplements you use. Also tell them if you smoke, drink alcohol, or use illegal drugs. Some items may interact with your medicine. What should I watch for while using this medicine? Report any side effects that do not go away within 3 days to your doctor or health care professional. Call your health care provider if any unusual symptoms occur within 6 weeks of receiving this vaccine. You may still catch the flu, but the illness is not usually as bad. You cannot get the flu from the vaccine. The vaccine will not protect against colds or other illnesses that may cause fever. The vaccine is needed every year. What side effects may I notice from receiving this medicine? Side effects that you should report to your doctor or health care professional as soon as possible:  allergic reactions like skin rash, itching or hives, swelling of the face, lips, or tongue Side effects that usually do not require medical attention (Report these to your doctor or health care professional if they continue or are bothersome.):  fever  headache  muscle aches and pains  pain, tenderness, redness, or swelling at the injection site  tiredness This list may not describe all possible side effects. Call your doctor for medical advice about side  effects. You may report side effects to FDA at 1-800-FDA-1088. Where should I keep my medicine? The vaccine will be given by a health care professional in a clinic, pharmacy, doctor's office, or other health care setting. You will not be given vaccine doses to store at home. NOTE: This sheet is a summary. It may not cover all possible information. If you have questions about this medicine, talk to your doctor, pharmacist, or health care provider.  2020 Elsevier/Gold Standard (2011-10-31 14:06:47)

## 2020-09-20 NOTE — Progress Notes (Signed)
This visit occurred during the SARS-CoV-2 public health emergency.  Safety protocols were in place, including screening questions prior to the visit, additional usage of staff PPE, and extensive cleaning of exam room while observing appropriate contact time as indicated for disinfecting solutions.  Subjective:     Patient ID: Krista Adams , female    DOB: 1986-10-31 , 34 y.o.   MRN: [id number]   Chief Complaint  Patient presents with  . Annual Exam    HPI  Pt here for HM  Wt Readings from Last 3 Encounters: 09/20/20 : 246 lb (111.6 kg) 06/08/20 : 251 lb (113.9 kg) 03/09/20 : 256 lb 6.4 oz (116.3 kg)  LMP was on 09/18/2020 She is scheduled to have her gallbladder removed Nov 16th at [facility].   She is seeing a GYN now, she is seeing a fertility specialist.  She had a test to see if her fallopian tubes are blocked.    Diabetes She presents for her follow-up diabetic visit. She has type 2 diabetes mellitus. Her disease course has been improving. There are no hypoglycemic associated symptoms. Pertinent negatives for hypoglycemia include no dizziness or headaches. Pertinent negatives for diabetes include no chest pain, no polydipsia, no polyphagia and no polyuria. There are no hypoglycemic complications. There are no diabetic complications. Risk factors for coronary artery disease include diabetes mellitus, obesity and sedentary lifestyle. Current diabetic treatment includes oral agent (dual therapy). She is compliant with treatment all of the time. She is following a generally unhealthy diet. When asked about meal planning, she reported none. She has not had a previous visit with a dietitian. She rarely participates in exercise. There is no change in her home blood glucose trend. (Blood sugar ranging 80-130) An ACE inhibitor/angiotensin II receptor blocker is being taken. She does not see a podiatrist.Eye exam is not current (she has still not made it to the eye doctor).      Past Medical History:  Diagnosis Date  . Anovulatory (dysfunctional uterine) bleeding 03/13/2012  . Anxiety   . Depression   . Depression with anxiety 03/18/2012  . Diabetes mellitus without complication ([place])   . DM (diabetes mellitus), type 2, uncontrolled ([place]) 05/06/2008   Qualifier: Diagnosis of  By: Drue Flirt  MD, Merrily Brittle    . Essential hypertension, benign 05/06/2008   Qualifier: Diagnosis of  By: Jamal Collin MD, Ovid Curd    . Morbid obesity ([place]) 05/06/2008   Qualifier: Diagnosis of  By: Jamal Collin MD, Ovid Curd       Family History  Problem Relation Age of Onset  . Diabetes Mother   . Hypertension Mother   . Diabetes Maternal Aunt   . Diabetes Maternal Grandmother   . Hypertension Father      Current Outpatient Medications:  .  blood glucose meter kit and supplies KIT, Dispense based on patient and insurance preference. Use up to four times daily as directed. (FOR ICD-9 250.00, 250.01)., Disp: 1 each, Rfl: 0 .  insulin degludec (TRESIBA FLEXTOUCH) 100 UNIT/ML FlexTouch Pen, Inject 0.35 mLs (35 Units total) into the skin daily., Disp: 15 pen, Rfl: 1 .  labetalol (NORMODYNE) 100 MG tablet, TAKE 1 TABLET (100 MG TOTAL) BY MOUTH ONCE FOR 1 DOSE., Disp: 90 tablet, Rfl: 0 .  Magnesium 200 MG TABS, Take 1 tablet by mouth daily with evening meal, Disp: 90 tablet, Rfl: 1 .  metFORMIN (GLUCOPHAGE-XR) 500 MG 24 hr tablet, TAKE 1 TABLET BY MOUTH EVERY DAY WITH BREAKFAST, Disp: 90 tablet, Rfl: 1 .  nystatin-triamcinolone ointment (MYCOLOG), Apply 1 application topically 2 (two) times daily., Disp: 30 g, Rfl: 2 .  ondansetron (ZOFRAN) 4 MG tablet, TAKE 1 TABLET BY MOUTH EVERY 8 HOURS AS NEEDED FOR NAUSEA AND VOMITING, Disp: 30 tablet, Rfl: 1 .  Semaglutide (RYBELSUS) 14 MG TABS, Take 1 tablet by mouth daily. Take 30 minutes before eating breakfast, Disp: 30 tablet, Rfl: 5   No Known Allergies    The patient states she uses none for birth control.  Negative for Dysmenorrhea and Negative for Menorrhagia.  Negative for: breast discharge, breast lump(s), breast pain and breast self exam. Associated symptoms include abnormal vaginal bleeding. Pertinent negatives include abnormal bleeding (hematology), anxiety, decreased libido, depression, difficulty falling sleep, dyspareunia, history of infertility, nocturia, sexual dysfunction, sleep disturbances, urinary incontinence, urinary urgency, vaginal discharge and vaginal itching. Diet regular. The patient states her exercise level is minimal with walking 2 times a week.    The patient's tobacco use is:  Social History   Tobacco Use  Smoking Status Current Every Day Smoker  . Packs/day: 1.00  . Types: Cigars  Smokeless Tobacco Never Used  Tobacco Comment   smokes black and mild ; reports not smoking as much   She has been exposed to passive smoke. The patient's alcohol use is:  Social History   Substance and Sexual Activity  Alcohol Use No  . Additional information: Last pap she is unsure feels like has been some time, she is now being followed by an high risk infertility specialist  Review of Systems  Constitutional: Negative.   HENT: Negative.   Eyes: Negative.   Respiratory: Negative.   Cardiovascular: Negative.  Negative for chest pain.  Gastrointestinal: Positive for nausea.       Scheduled to have gallbladder removed in November  Endocrine: Negative.  Negative for polydipsia, polyphagia and polyuria.  Genitourinary: Negative.   Musculoskeletal: Negative.   Skin: Negative.   Allergic/Immunologic: Negative.   Neurological: Negative.  Negative for dizziness and headaches.  Hematological: Negative.   Psychiatric/Behavioral: Negative.      Today's Vitals   09/20/20 1414  BP: 124/80  Pulse: 65  Temp: 98.5 F (36.9 C)  Weight: 246 lb (111.6 kg)  Height: 5' 5.6" (1.666 m)  PainSc: 0-No pain   Body mass index is 40.19 kg/m.   Objective:  Physical Exam Constitutional:      General: She is not in acute distress.     Appearance: Normal appearance. She is well-developed. She is obese.  HENT:     Head: Normocephalic and atraumatic.     Right Ear: Hearing, tympanic membrane, ear canal and external ear normal. There is no impacted cerumen.     Left Ear: Hearing, tympanic membrane, ear canal and external ear normal. There is no impacted cerumen.     Nose:     Comments: Deferred - masked    Mouth/Throat:     Comments: Deferred - masked Eyes:     General: Lids are normal.     Extraocular Movements: Extraocular movements intact.     Conjunctiva/sclera: Conjunctivae normal.     Pupils: Pupils are equal, round, and reactive to light.     Funduscopic exam:    Right eye: No papilledema.        Left eye: No papilledema.  Neck:     Thyroid: No thyroid mass.     Vascular: No carotid bruit.  Cardiovascular:     Rate and Rhythm: Normal rate and regular rhythm.  Pulses: Normal pulses.     Heart sounds: Normal heart sounds. No murmur heard.   Pulmonary:     Effort: Pulmonary effort is normal.     Breath sounds: Normal breath sounds.  Chest:     Chest wall: No mass.     Breasts: Tanner Score is 5.        Right: Normal. No mass or tenderness.        Left: Normal. No mass or tenderness.  Abdominal:     General: Abdomen is flat. Bowel sounds are normal. There is no distension.     Palpations: Abdomen is soft.     Tenderness: There is no abdominal tenderness.  Genitourinary:    Rectum: Guaiac result negative.  Musculoskeletal:        General: No swelling. Normal range of motion.     Cervical back: Full passive range of motion without pain, normal range of motion and neck supple.     Right lower leg: No edema.     Left lower leg: No edema.  Lymphadenopathy:     Upper Body:     Right upper body: No supraclavicular, axillary or pectoral adenopathy.     Left upper body: No supraclavicular, axillary or pectoral adenopathy.  Skin:    General: Skin is warm and dry.     Capillary Refill: Capillary refill  takes less than 2 seconds.  Neurological:     General: No focal deficit present.     Mental Status: She is alert and oriented to person, place, and time.     Cranial Nerves: No cranial nerve deficit.     Sensory: No sensory deficit.  Psychiatric:        Mood and Affect: Mood normal.        Behavior: Behavior normal.        Thought Content: Thought content normal.        Judgment: Judgment normal.         Assessment And Plan:     1. Encounter for general adult medical examination w/o abnormal findings . Behavior modifications discussed and diet history reviewed.   . Pt will continue to exercise regularly and modify diet with low GI, plant based foods and decrease intake of processed foods.  . Recommend intake of daily multivitamin, Vitamin D, and calcium.  . Recommend for preventive screenings, as well as recommend immunizations that include influenza, TDAP (up to date) . Discussed quitting smoking black and milds  2. Essential hypertension, benign . B/P is well controlled.  . CMP ordered to check renal function.  . The importance of regular exercise and dietary modification was stressed to the patient.  . Stressed importance of losing ten percent of her body weight to help with B/P control.  . EKG done in February - COMPLETE METABOLIC PANEL WITH GFR  3. Type 2 diabetes mellitus with hyperglycemia, with long-term current use of insulin (HCC)  Chronic, HgbA1c has increased significantly and is tolerating rybelsus well  Encouraged to limit intake of sugary foods and drinks  Encouraged to increase physical activity to 150 minutes per week  She already has a referral for the eye doctor - Semaglutide (RYBELSUS) 14 MG TABS; Take 1 tablet by mouth daily. Take 30 minutes before eating breakfast  Dispense: 30 tablet; Refill: 5 - CBC - COMPLETE METABOLIC PANEL WITH GFR  4. Other long term (current) drug therapy - VITAMIN D 25 Hydroxy (Vit-D Deficiency, Fractures)  5. Morbid  obesity (HCC)  Chronic  Discussed  healthy diet and regular exercise options   Encouraged to exercise at least 150 minutes per week with 2 days of strength training  6. Encounter for immunization  Will give tetanus vaccine today while in office. TDAP will be administered to adults 28-102 years old every 10 years.  She was also given pneumonia vaccine - Flu Vaccine QUAD 6+ mos PF IM (Fluarix Quad PF) - Pneumococcal polysaccharide vaccine 23-valent greater than or equal to 2yo subcutaneous/IM    Patient was given opportunity to ask questions. Patient verbalized understanding of the plan and was able to repeat key elements of the plan. All questions were answered to their satisfaction.   Teola Bradley, FNP, have reviewed all documentation for this visit. The documentation on 09/25/20 for the exam, diagnosis, procedures, and orders are all accurate and complete.  THE PATIENT IS ENCOURAGED TO PRACTICE SOCIAL DISTANCING DUE TO THE COVID-19 PANDEMIC.

## 2020-09-21 ENCOUNTER — Encounter: Payer: Self-pay | Admitting: Nurse Practitioner

## 2020-09-25 ENCOUNTER — Encounter: Payer: Self-pay | Admitting: Nurse Practitioner

## 2020-10-18 ENCOUNTER — Other Ambulatory Visit: Payer: Self-pay | Admitting: Surgery

## 2020-10-19 ENCOUNTER — Encounter: Payer: BC Managed Care – PPO | Admitting: Nurse Practitioner

## 2020-10-22 ENCOUNTER — Other Ambulatory Visit: Payer: Self-pay | Admitting: Nurse Practitioner

## 2020-10-22 DIAGNOSIS — E1165 Type 2 diabetes mellitus with hyperglycemia: Secondary | ICD-10-CM

## 2020-10-22 DIAGNOSIS — Z794 Long term (current) use of insulin: Secondary | ICD-10-CM

## 2020-12-16 ENCOUNTER — Other Ambulatory Visit: Payer: Self-pay | Admitting: Nurse Practitioner

## 2020-12-16 DIAGNOSIS — I10 Essential (primary) hypertension: Secondary | ICD-10-CM

## 2020-12-22 ENCOUNTER — Other Ambulatory Visit: Payer: Self-pay

## 2020-12-22 ENCOUNTER — Encounter: Payer: Self-pay | Admitting: Nurse Practitioner

## 2020-12-22 ENCOUNTER — Ambulatory Visit: Payer: BC Managed Care – PPO | Admitting: Nurse Practitioner

## 2020-12-22 VITALS — BP 138/82 | HR 59 | Temp 97.7°F | Wt 231.0 lb

## 2020-12-22 DIAGNOSIS — R6889 Other general symptoms and signs: Secondary | ICD-10-CM | POA: Diagnosis not present

## 2020-12-22 DIAGNOSIS — E1165 Type 2 diabetes mellitus with hyperglycemia: Secondary | ICD-10-CM | POA: Diagnosis not present

## 2020-12-22 DIAGNOSIS — I10 Essential (primary) hypertension: Secondary | ICD-10-CM | POA: Diagnosis not present

## 2020-12-22 DIAGNOSIS — Z794 Long term (current) use of insulin: Secondary | ICD-10-CM

## 2020-12-22 NOTE — Progress Notes (Signed)
Tomasa Hose as a scribe for Arnette Felts, FNP.,have documented all relevant documentation on the behalf of Arnette Felts, FNP,as directed by  Arnette Felts, FNP while in the presence of Arnette Felts, FNP. This visit occurred during the SARS-CoV-2 public health emergency.  Safety protocols were in place, including screening questions prior to the visit, additional usage of staff PPE, and extensive cleaning of exam room while observing appropriate contact time as indicated for disinfecting solutions.  Subjective:     Patient ID: Krista Adams , female    DOB: Apr 05, 1986 , 35 y.o.   MRN: [id number]   Chief Complaint  Patient presents with  . Diabetes    HPI  She is currently out of work and is having difficulty with paying for some medications.  She has been without her strips for the glucometer and she can not afford the strips based on glucometer.    Diabetes She presents for her follow-up diabetic visit. She has type 2 diabetes mellitus. Her disease course has been improving. There are no hypoglycemic associated symptoms. Pertinent negatives for hypoglycemia include no dizziness or headaches. Associated symptoms include fatigue. Pertinent negatives for diabetes include no chest pain, no polydipsia, no polyphagia and no polyuria. There are no hypoglycemic complications. There are no diabetic complications. Risk factors for coronary artery disease include diabetes mellitus, obesity and sedentary lifestyle. Current diabetic treatment includes oral agent (dual therapy). She is compliant with treatment all of the time. She is following a generally unhealthy diet. When asked about meal planning, she reported none. She has not had a previous visit with a dietitian. She rarely participates in exercise. There is no change in her home blood glucose trend. (Not currently checking blood sugar) An ACE inhibitor/angiotensin II receptor blocker is being taken. She does not see a podiatrist.Eye exam  is not current.     Past Medical History:  Diagnosis Date  . Anovulatory (dysfunctional uterine) bleeding 03/13/2012  . Anxiety   . Depression   . Depression with anxiety 03/18/2012  . Diabetes mellitus without complication (HCC)   . DM (diabetes mellitus), type 2, uncontrolled (HCC) 05/06/2008   Qualifier: Diagnosis of  By: Lanier Prude  MD, Cathrine Muster    . Essential hypertension, benign 05/06/2008   Qualifier: Diagnosis of  By: Darrol Angel MD, Harrold Donath    . Morbid obesity (HCC) 05/06/2008   Qualifier: Diagnosis of  By: Darrol Angel MD, Harrold Donath       Family History  Problem Relation Age of Onset  . Diabetes Mother   . Hypertension Mother   . Diabetes Maternal Aunt   . Diabetes Maternal Grandmother   . Hypertension Father      Current Outpatient Medications:  .  insulin degludec (TRESIBA FLEXTOUCH) 100 UNIT/ML FlexTouch Pen, Inject 0.35 mLs (35 Units total) into the skin daily., Disp: 15 pen, Rfl: 1 .  labetalol (NORMODYNE) 100 MG tablet, TAKE 1 TABLET BY MOUTH EVERY DAY, Disp: 90 tablet, Rfl: 0 .  Magnesium 200 MG TABS, Take 1 tablet by mouth daily with evening meal, Disp: 90 tablet, Rfl: 1 .  metFORMIN (GLUCOPHAGE-XR) 500 MG 24 hr tablet, TAKE 1 TABLET BY MOUTH EVERY DAY WITH BREAKFAST, Disp: 90 tablet, Rfl: 1 .  nystatin-triamcinolone ointment (MYCOLOG), Apply 1 application topically 2 (two) times daily., Disp: 30 g, Rfl: 2 .  ondansetron (ZOFRAN) 4 MG tablet, TAKE 1 TABLET BY MOUTH EVERY 8 HOURS AS NEEDED FOR NAUSEA AND VOMITING, Disp: 30 tablet, Rfl: 1 .  Semaglutide (RYBELSUS) 14 MG TABS, Take  1 tablet by mouth daily. Take 30 minutes before eating breakfast, Disp: 30 tablet, Rfl: 5 .  blood glucose meter kit and supplies KIT, Dispense based on patient and insurance preference. Use up to four times daily as directed. (FOR ICD-9 250.00, 250.01). (Patient not taking: Reported on 12/22/2020), Disp: 1 each, Rfl: 0   No Known Allergies   Review of Systems  Constitutional: Positive for fatigue. Negative  for fever.  HENT: Negative.   Respiratory: Negative.  Negative for wheezing.   Cardiovascular: Negative for chest pain, palpitations and leg swelling.  Gastrointestinal: Negative.   Endocrine: Negative for polydipsia, polyphagia and polyuria.  Musculoskeletal: Negative.   Skin: Negative.   Neurological: Negative for dizziness and headaches.  Psychiatric/Behavioral: Negative.      Today's Vitals   12/22/20 1413  BP: 138/82  Pulse: (!) 59  Temp: 97.7 F (36.5 C)  TempSrc: Oral  Weight: 231 lb (104.8 kg)   Body mass index is 37.74 kg/m.  Wt Readings from Last 3 Encounters:  12/22/20 231 lb (104.8 kg)  09/20/20 246 lb (111.6 kg)  06/08/20 251 lb (113.9 kg)   Objective:  Physical Exam Constitutional:      General: She is not in acute distress.    Appearance: Normal appearance.  Cardiovascular:     Rate and Rhythm: Normal rate and regular rhythm.     Pulses: Normal pulses.     Heart sounds: No murmur heard.   Pulmonary:     Effort: Pulmonary effort is normal. No respiratory distress.     Breath sounds: Normal breath sounds. No wheezing.  Neurological:     General: No focal deficit present.     Mental Status: She is alert and oriented to person, place, and time.     Cranial Nerves: No cranial nerve deficit.  Psychiatric:        Mood and Affect: Mood normal.        Behavior: Behavior normal.        Thought Content: Thought content normal.        Judgment: Judgment normal.         Assessment And Plan:     1. Essential hypertension, benign . B/P is fairly controlled.  . CMP ordered to check renal function.  . The importance of regular exercise and dietary modification was stressed to the patient.  - COMPLETE METABOLIC PANEL WITH GFR  2. Type 2 diabetes mellitus with hyperglycemia, with long-term current use of insulin (HCC)  Chronic, this is much improved  Continue with current medications, will consider decreasing her dose of tresiba  Encouraged to limit  intake of sugary foods and drinks  Encouraged to increase physical activity to 150 minutes per week as tolerated - Hemoglobin A1c - COMPLETE METABOLIC PANEL WITH GFR  3. Sensation of feeling cold  Will check iron studies her thyroid levels are normal.   - Iron, TIBC and Ferritin Panel  4. Morbid obesity (HCC)  Chronic  Discussed healthy diet and regular exercise options   Encouraged to exercise at least 150 minutes per week with 2 days of strength training       Patient was given opportunity to ask questions. Patient verbalized understanding of the plan and was able to repeat key elements of the plan. All questions were answered to their satisfaction.  Arnette Felts, FNP   I, Arnette Felts, FNP, have reviewed all documentation for this visit. The documentation on 12/22/20 for the exam, diagnosis, procedures, and orders are all accurate and  complete.   THE PATIENT IS ENCOURAGED TO PRACTICE SOCIAL DISTANCING DUE TO THE COVID-19 PANDEMIC.

## 2020-12-22 NOTE — Patient Instructions (Signed)
Hypertension, Adult Hypertension is another name for high blood pressure. High blood pressure forces your heart to work harder to pump blood. This can cause problems over time. There are two numbers in a blood pressure reading. There is a top number (systolic) over a bottom number (diastolic). It is best to have a blood pressure that is below 120/80. Healthy choices can help lower your blood pressure, or you may need medicine to help lower it. What are the causes? The cause of this condition is not known. Some conditions may be related to high blood pressure. What increases the risk?  Smoking.  Having type 2 diabetes mellitus, high cholesterol, or both.  Not getting enough exercise or physical activity.  Being overweight.  Having too much fat, sugar, calories, or salt (sodium) in your diet.  Drinking too much alcohol.  Having long-term (chronic) kidney disease.  Having a family history of high blood pressure.  Age. Risk increases with age.  Race. You may be at higher risk if you are African American.  Gender. Men are at higher risk than women before age 45. After age 65, women are at higher risk than men.  Having obstructive sleep apnea.  Stress. What are the signs or symptoms?  High blood pressure may not cause symptoms. Very high blood pressure (hypertensive crisis) may cause: ? Headache. ? Feelings of worry or nervousness (anxiety). ? Shortness of breath. ? Nosebleed. ? A feeling of being sick to your stomach (nausea). ? Throwing up (vomiting). ? Changes in how you see. ? Very bad chest pain. ? Seizures. How is this treated?  This condition is treated by making healthy lifestyle changes, such as: ? Eating healthy foods. ? Exercising more. ? Drinking less alcohol.  Your health care provider may prescribe medicine if lifestyle changes are not enough to get your blood pressure under control, and if: ? Your top number is above 130. ? Your bottom number is above  80.  Your personal target blood pressure may vary. Follow these instructions at home: Eating and drinking  If told, follow the DASH eating plan. To follow this plan: ? Fill one half of your plate at each meal with fruits and vegetables. ? Fill one fourth of your plate at each meal with whole grains. Whole grains include whole-wheat pasta, brown rice, and whole-grain bread. ? Eat or drink low-fat dairy products, such as skim milk or low-fat yogurt. ? Fill one fourth of your plate at each meal with low-fat (lean) proteins. Low-fat proteins include fish, chicken without skin, eggs, beans, and tofu. ? Avoid fatty meat, cured and processed meat, or chicken with skin. ? Avoid pre-made or processed food.  Eat less than 1,500 mg of salt each day.  Do not drink alcohol if: ? Your doctor tells you not to drink. ? You are pregnant, may be pregnant, or are planning to become pregnant.  If you drink alcohol: ? Limit how much you use to:  0-1 drink a day for women.  0-2 drinks a day for men. ? Be aware of how much alcohol is in your drink. In the [place] one drink equals one 12 oz bottle of beer (355 mL), one 5 oz glass of wine (148 mL), or one 1 oz glass of hard liquor (44 mL).   Lifestyle  Work with your doctor to stay at a healthy weight or to lose weight. Ask your doctor what the best weight is for you.  Get at least 30 minutes of exercise most   days of the week. This may include walking, swimming, or biking.  Get at least 30 minutes of exercise that strengthens your muscles (resistance exercise) at least 3 days a week. This may include lifting weights or doing Pilates.  Do not use any products that contain nicotine or tobacco, such as cigarettes, e-cigarettes, and chewing tobacco. If you need help quitting, ask your doctor.  Check your blood pressure at home as told by your doctor.  Keep all follow-up visits as told by your doctor. This is important.   Medicines  Take over-the-counter  and prescription medicines only as told by your doctor. Follow directions carefully.  Do not skip doses of blood pressure medicine. The medicine does not work as well if you skip doses. Skipping doses also puts you at risk for problems.  Ask your doctor about side effects or reactions to medicines that you should watch for. Contact a doctor if you:  Think you are having a reaction to the medicine you are taking.  Have headaches that keep coming back (recurring).  Feel dizzy.  Have swelling in your ankles.  Have trouble with your vision. Get help right away if you:  Get a very bad headache.  Start to feel mixed up (confused).  Feel weak or numb.  Feel faint.  Have very bad pain in your: ? Chest. ? Belly (abdomen).  Throw up more than once.  Have trouble breathing. Summary  Hypertension is another name for high blood pressure.  High blood pressure forces your heart to work harder to pump blood.  For most people, a normal blood pressure is less than 120/80.  Making healthy choices can help lower blood pressure. If your blood pressure does not get lower with healthy choices, you may need to take medicine. This information is not intended to replace advice given to you by your health care provider. Make sure you discuss any questions you have with your health care provider. Document Revised: 07/30/2018 Document Reviewed: 07/30/2018 Elsevier Patient Education  [place]. Diabetes Mellitus and Exercise Exercising regularly is important for overall health, especially for people who have diabetes mellitus. Exercising is not only about losing weight. It has many other health benefits, such as increasing muscle strength and bone density and reducing body fat and stress. This leads to improved fitness, flexibility, and endurance, all of which result in better overall health. What are the benefits of exercise if I have diabetes? Exercise has many benefits for people with  diabetes. They include:  Helping to lower and control blood sugar (glucose).  Helping the body to respond better to the hormone insulin by improving insulin sensitivity.  Reducing how much insulin the body needs.  Lowering the risk for heart disease by: ? Lowering "bad" cholesterol and triglyceride levels. ? Increasing "good" cholesterol levels. ? Lowering blood pressure. ? Lowering blood glucose levels. What is my activity plan? Your health care provider or certified diabetes educator can help you make a plan for the type and frequency of exercise that works for you. This is called your activity plan. Be sure to:  Get at least 150 minutes of medium-intensity or high-intensity exercise each week. Exercises may include brisk walking, biking, or water aerobics.  Do stretching and strengthening exercises, such as yoga or weight lifting, at least 2 times a week.  Spread out your activity over at least 3 days of the week.  Get some form of physical activity each day. ? Do not go more than 2 days in  a row without some kind of physical activity. ? Avoid being inactive for more than 90 minutes at a time. Take frequent breaks to walk or stretch.  Choose exercises or activities that you enjoy. Set realistic goals.  Start slowly and gradually increase your exercise intensity over time.   How do I manage my diabetes during exercise? Monitor your blood glucose  Check your blood glucose before and after exercising. If your blood glucose is: ? 240 mg/dL (13.3 mmol/L) or higher before you exercise, check your urine for ketones. These are chemicals created by the liver. If you have ketones in your urine, do not exercise until your blood glucose returns to normal. ? 100 mg/dL (5.6 mmol/L) or lower, eat a snack containing 15-20 grams of carbohydrate. Check your blood glucose 15 minutes after the snack to make sure that your glucose level is above 100 mg/dL (5.6 mmol/L) before you start your  exercise.  Know the symptoms of low blood glucose (hypoglycemia) and how to treat it. Your risk for hypoglycemia increases during and after exercise. Follow these tips and your health care provider's instructions  Keep a carbohydrate snack that is fast-acting for use before, during, and after exercise to help prevent or treat hypoglycemia.  Avoid injecting insulin into areas of the body that are going to be exercised. For example, avoid injecting insulin into: ? Your arms, when you are about to play tennis. ? Your legs, when you are about to go jogging.  Keep records of your exercise habits. Doing this can help you and your health care provider adjust your diabetes management plan as needed. Write down: ? Food that you eat before and after you exercise. ? Blood glucose levels before and after you exercise. ? The type and amount of exercise you have done.  Work with your health care provider when you start a new exercise or activity. He or she may need to: ? Make sure that the activity is safe for you. ? Adjust your insulin, other medicines, and food that you eat.  Drink plenty of water while you exercise. This prevents loss of water (dehydration) and problems caused by a lot of heat in the body (heat stroke).   Where to find more information  American Diabetes Association: www.diabetes.org Summary  Exercising regularly is important for overall health, especially for people who have diabetes mellitus.  Exercising has many health benefits. It increases muscle strength and bone density and reduces body fat and stress. It also lowers and controls blood glucose.  Your health care provider or certified diabetes educator can help you make an activity plan for the type and frequency of exercise that works for you.  Work with your health care provider to make sure any new activity is safe for you. Also work with your health care provider to adjust your insulin, other medicines, and the food  you eat. This information is not intended to replace advice given to you by your health care provider. Make sure you discuss any questions you have with your health care provider. Document Revised: 08/17/2019 Document Reviewed: 08/17/2019 Elsevier Patient Education  [place].

## 2020-12-23 LAB — IRON,TIBC AND FERRITIN PANEL
%SAT: 14 % (calc) — ABNORMAL LOW (ref 16–45)
Ferritin: 130 ng/mL (ref 16–154)
Iron: 37 ug/dL — ABNORMAL LOW (ref 40–190)
TIBC: 273 mcg/dL (calc) (ref 250–450)

## 2020-12-23 LAB — COMPLETE METABOLIC PANEL WITH GFR
AG Ratio: 1.5 (calc) (ref 1.0–2.5)
ALT: 4 U/L — ABNORMAL LOW (ref 6–29)
AST: 12 U/L (ref 10–30)
Albumin: 4 g/dL (ref 3.6–5.1)
Alkaline phosphatase (APISO): 66 U/L (ref 31–125)
BUN: 12 mg/dL (ref 7–25)
CO2: 27 mmol/L (ref 20–32)
Calcium: 9.4 mg/dL (ref 8.6–10.2)
Chloride: 106 mmol/L (ref 98–110)
Creat: 0.7 mg/dL (ref 0.50–1.10)
GFR, Est African American: 129 mL/min/{1.73_m2} (ref >=60)
GFR, Est Non African American: 111 mL/min/{1.73_m2} (ref >=60)
Globulin: 2.7 g/dL (calc) (ref 1.9–3.7)
Glucose, Bld: 81 mg/dL (ref 65–99)
Potassium: 4.3 mmol/L (ref 3.5–5.3)
Sodium: 140 mmol/L (ref 135–146)
Total Bilirubin: 0.4 mg/dL (ref 0.2–1.2)
Total Protein: 6.7 g/dL (ref 6.1–8.1)

## 2020-12-23 LAB — HEMOGLOBIN A1C
Hgb A1c MFr Bld: 5.8 % of total Hgb — ABNORMAL HIGH (ref <5.7)
Mean Plasma Glucose: 120 mg/dL
eAG (mmol/L): 6.6 mmol/L

## 2020-12-26 ENCOUNTER — Other Ambulatory Visit: Payer: Self-pay

## 2020-12-26 ENCOUNTER — Encounter: Payer: Self-pay | Admitting: Nurse Practitioner

## 2020-12-26 MED ORDER — FERROUS SULFATE 325 (65 FE) MG PO TABS: 325.0000 mg | ORAL_TABLET | Freq: Every day | ORAL | 3 refills | Status: DC

## 2021-01-02 ENCOUNTER — Other Ambulatory Visit: Payer: Self-pay | Admitting: Nurse Practitioner

## 2021-01-02 DIAGNOSIS — E1165 Type 2 diabetes mellitus with hyperglycemia: Secondary | ICD-10-CM

## 2021-02-20 ENCOUNTER — Other Ambulatory Visit: Payer: Self-pay

## 2021-02-20 ENCOUNTER — Encounter: Payer: Self-pay | Admitting: Nurse Practitioner

## 2021-02-20 MED ORDER — ACCU-CHEK AVIVA PLUS VI STRP: ORAL_STRIP | 2 refills | Status: DC

## 2021-03-23 ENCOUNTER — Other Ambulatory Visit: Payer: Self-pay | Admitting: Nurse Practitioner

## 2021-03-23 ENCOUNTER — Encounter: Payer: Self-pay | Admitting: Nurse Practitioner

## 2021-03-23 ENCOUNTER — Other Ambulatory Visit: Payer: Self-pay

## 2021-03-23 ENCOUNTER — Ambulatory Visit: Payer: BC Managed Care – PPO | Admitting: Nurse Practitioner

## 2021-03-23 VITALS — BP 140/82 | HR 72 | Temp 98.0°F | Ht 65.6 in | Wt 226.0 lb

## 2021-03-23 DIAGNOSIS — F418 Other specified anxiety disorders: Secondary | ICD-10-CM

## 2021-03-23 DIAGNOSIS — I10 Essential (primary) hypertension: Secondary | ICD-10-CM

## 2021-03-23 DIAGNOSIS — E1165 Type 2 diabetes mellitus with hyperglycemia: Secondary | ICD-10-CM

## 2021-03-23 DIAGNOSIS — Z794 Long term (current) use of insulin: Secondary | ICD-10-CM

## 2021-03-23 MED ORDER — CITALOPRAM HYDROBROMIDE 10 MG PO TABS
10.0000 mg | ORAL_TABLET | Freq: Every day | ORAL | 2 refills | Status: DC
Start: 2021-03-23 — End: 2021-04-18

## 2021-03-23 MED ORDER — TRESIBA FLEXTOUCH 100 UNIT/ML ~~LOC~~ SOPN: 15.0000 [IU] | PEN_INJECTOR | Freq: Every day | SUBCUTANEOUS | 1 refills | Status: DC

## 2021-03-23 NOTE — Patient Instructions (Signed)
Diabetes Mellitus and Exercise Exercising regularly is important for overall health, especially for people who have diabetes mellitus. Exercising is not only about losing weight. It has many other health benefits, such as increasing muscle strength and bone density and reducing body fat and stress. This leads to improved fitness, flexibility, and endurance, all of which result in better overall health. What are the benefits of exercise if I have diabetes? Exercise has many benefits for people with diabetes. They include:  Helping to lower and control blood sugar (glucose).  Helping the body to respond better to the hormone insulin by improving insulin sensitivity.  Reducing how much insulin the body needs.  Lowering the risk for heart disease by: ? Lowering "bad" cholesterol and triglyceride levels. ? Increasing "good" cholesterol levels. ? Lowering blood pressure. ? Lowering blood glucose levels. What is my activity plan? Your health care provider or certified diabetes educator can help you make a plan for the type and frequency of exercise that works for you. This is called your activity plan. Be sure to:  Get at least 150 minutes of medium-intensity or high-intensity exercise each week. Exercises may include brisk walking, biking, or water aerobics.  Do stretching and strengthening exercises, such as yoga or weight lifting, at least 2 times a week.  Spread out your activity over at least 3 days of the week.  Get some form of physical activity each day. ? Do not go more than 2 days in a row without some kind of physical activity. ? Avoid being inactive for more than 90 minutes at a time. Take frequent breaks to walk or stretch.  Choose exercises or activities that you enjoy. Set realistic goals.  Start slowly and gradually increase your exercise intensity over time.   How do I manage my diabetes during exercise? Monitor your blood glucose  Check your blood glucose before and  after exercising. If your blood glucose is: ? 240 mg/dL (13.3 mmol/L) or higher before you exercise, check your urine for ketones. These are chemicals created by the liver. If you have ketones in your urine, do not exercise until your blood glucose returns to normal. ? 100 mg/dL (5.6 mmol/L) or lower, eat a snack containing 15-20 grams of carbohydrate. Check your blood glucose 15 minutes after the snack to make sure that your glucose level is above 100 mg/dL (5.6 mmol/L) before you start your exercise.  Know the symptoms of low blood glucose (hypoglycemia) and how to treat it. Your risk for hypoglycemia increases during and after exercise. Follow these tips and your health care provider's instructions  Keep a carbohydrate snack that is fast-acting for use before, during, and after exercise to help prevent or treat hypoglycemia.  Avoid injecting insulin into areas of the body that are going to be exercised. For example, avoid injecting insulin into: ? Your arms, when you are about to play tennis. ? Your legs, when you are about to go jogging.  Keep records of your exercise habits. Doing this can help you and your health care provider adjust your diabetes management plan as needed. Write down: ? Food that you eat before and after you exercise. ? Blood glucose levels before and after you exercise. ? The type and amount of exercise you have done.  Work with your health care provider when you start a new exercise or activity. He or she may need to: ? Make sure that the activity is safe for you. ? Adjust your insulin, other medicines, and food that   you eat.  Drink plenty of water while you exercise. This prevents loss of water (dehydration) and problems caused by a lot of heat in the body (heat stroke).   Where to find more information  American Diabetes Association: www.diabetes.org Summary  Exercising regularly is important for overall health, especially for people who have diabetes  mellitus.  Exercising has many health benefits. It increases muscle strength and bone density and reduces body fat and stress. It also lowers and controls blood glucose.  Your health care provider or certified diabetes educator can help you make an activity plan for the type and frequency of exercise that works for you.  Work with your health care provider to make sure any new activity is safe for you. Also work with your health care provider to adjust your insulin, other medicines, and the food you eat. This information is not intended to replace advice given to you by your health care provider. Make sure you discuss any questions you have with your health care provider. Document Revised: 08/17/2019 Document Reviewed: 08/17/2019 Elsevier Patient Education  2021 Elsevier Inc. Hypertension, Adult Hypertension is another name for high blood pressure. High blood pressure forces your heart to work harder to pump blood. This can cause problems over time. There are two numbers in a blood pressure reading. There is a top number (systolic) over a bottom number (diastolic). It is best to have a blood pressure that is below 120/80. Healthy choices can help lower your blood pressure, or you may need medicine to help lower it. What are the causes? The cause of this condition is not known. Some conditions may be related to high blood pressure. What increases the risk?  Smoking.  Having type 2 diabetes mellitus, high cholesterol, or both.  Not getting enough exercise or physical activity.  Being overweight.  Having too much fat, sugar, calories, or salt (sodium) in your diet.  Drinking too much alcohol.  Having long-term (chronic) kidney disease.  Having a family history of high blood pressure.  Age. Risk increases with age.  Race. You may be at higher risk if you are African American.  Gender. Men are at higher risk than women before age 45. After age 65, women are at higher risk than  men.  Having obstructive sleep apnea.  Stress. What are the signs or symptoms?  High blood pressure may not cause symptoms. Very high blood pressure (hypertensive crisis) may cause: ? Headache. ? Feelings of worry or nervousness (anxiety). ? Shortness of breath. ? Nosebleed. ? A feeling of being sick to your stomach (nausea). ? Throwing up (vomiting). ? Changes in how you see. ? Very bad chest pain. ? Seizures. How is this treated?  This condition is treated by making healthy lifestyle changes, such as: ? Eating healthy foods. ? Exercising more. ? Drinking less alcohol.  Your health care provider may prescribe medicine if lifestyle changes are not enough to get your blood pressure under control, and if: ? Your top number is above 130. ? Your bottom number is above 80.  Your personal target blood pressure may vary. Follow these instructions at home: Eating and drinking  If told, follow the DASH eating plan. To follow this plan: ? Fill one half of your plate at each meal with fruits and vegetables. ? Fill one fourth of your plate at each meal with whole grains. Whole grains include whole-wheat pasta, brown rice, and whole-grain bread. ? Eat or drink low-fat dairy products, such as skim milk or   low-fat yogurt. ? Fill one fourth of your plate at each meal with low-fat (lean) proteins. Low-fat proteins include fish, chicken without skin, eggs, beans, and tofu. ? Avoid fatty meat, cured and processed meat, or chicken with skin. ? Avoid pre-made or processed food.  Eat less than 1,500 mg of salt each day.  Do not drink alcohol if: ? Your doctor tells you not to drink. ? You are pregnant, may be pregnant, or are planning to become pregnant.  If you drink alcohol: ? Limit how much you use to:  0-1 drink a day for women.  0-2 drinks a day for men. ? Be aware of how much alcohol is in your drink. In the [place] one drink equals one 12 oz bottle of beer (355 mL), one 5 oz glass  of wine (148 mL), or one 1 oz glass of hard liquor (44 mL).   Lifestyle  Work with your doctor to stay at a healthy weight or to lose weight. Ask your doctor what the best weight is for you.  Get at least 30 minutes of exercise most days of the week. This may include walking, swimming, or biking.  Get at least 30 minutes of exercise that strengthens your muscles (resistance exercise) at least 3 days a week. This may include lifting weights or doing Pilates.  Do not use any products that contain nicotine or tobacco, such as cigarettes, e-cigarettes, and chewing tobacco. If you need help quitting, ask your doctor.  Check your blood pressure at home as told by your doctor.  Keep all follow-up visits as told by your doctor. This is important.   Medicines  Take over-the-counter and prescription medicines only as told by your doctor. Follow directions carefully.  Do not skip doses of blood pressure medicine. The medicine does not work as well if you skip doses. Skipping doses also puts you at risk for problems.  Ask your doctor about side effects or reactions to medicines that you should watch for. Contact a doctor if you:  Think you are having a reaction to the medicine you are taking.  Have headaches that keep coming back (recurring).  Feel dizzy.  Have swelling in your ankles.  Have trouble with your vision. Get help right away if you:  Get a very bad headache.  Start to feel mixed up (confused).  Feel weak or numb.  Feel faint.  Have very bad pain in your: ? Chest. ? Belly (abdomen).  Throw up more than once.  Have trouble breathing. Summary  Hypertension is another name for high blood pressure.  High blood pressure forces your heart to work harder to pump blood.  For most people, a normal blood pressure is less than 120/80.  Making healthy choices can help lower blood pressure. If your blood pressure does not get lower with healthy choices, you may need to  take medicine. This information is not intended to replace advice given to you by your health care provider. Make sure you discuss any questions you have with your health care provider. Document Revised: 07/30/2018 Document Reviewed: 07/30/2018 Elsevier Patient Education  2021 [employer].

## 2021-03-23 NOTE — Progress Notes (Signed)
Rutherford Nail as a scribe for [name], FNP.,have documented all relevant documentation on the behalf of [name], FNP,as directed by  [name], FNP while in the presence of [name], [place]. This visit occurred during the SARS-CoV-2 public health emergency.  Safety protocols were in place, including screening questions prior to the visit, additional usage of staff PPE, and extensive cleaning of exam room while observing appropriate contact time as indicated for disinfecting solutions.  Subjective:     Patient ID: Krista Adams , female    DOB: Aug 07, 1986 , 35 y.o.   MRN: [id number]   Chief Complaint  Patient presents with  . Diabetes  . Hypertension  . Depression    HPI  Pt here today for f/u on blood pressure and diabetes she is compliant with all meds.  She has a brother who is diagnosed with ADHD  Diabetes She presents for her follow-up diabetic visit. She has type 2 diabetes mellitus. Her disease course has been improving. Hypoglycemia symptoms include nervousness/anxiousness. Pertinent negatives for hypoglycemia include no dizziness or headaches. Associated symptoms include fatigue. Pertinent negatives for diabetes include no chest pain, no polydipsia, no polyphagia and no polyuria. There are no hypoglycemic complications. There are no diabetic complications. Risk factors for coronary artery disease include diabetes mellitus, obesity and sedentary lifestyle. Current diabetic treatment includes oral agent (dual therapy). She is compliant with treatment all of the time. She is following a generally unhealthy diet. When asked about meal planning, she reported none. She has not had a previous visit with a dietitian. She rarely participates in exercise. There is no change in her home blood glucose trend. (Blood sugar last week was 101-124 this morning up to 130.  ) An ACE inhibitor/angiotensin II receptor blocker is being taken. She does not see a podiatrist.Eye exam  is not current.  Hypertension Pertinent negatives include no chest pain, headaches or palpitations.     Past Medical History:  Diagnosis Date  . Anovulatory (dysfunctional uterine) bleeding 03/13/2012  . Anxiety   . Depression   . Depression with anxiety 03/18/2012  . Diabetes mellitus without complication ([place])   . DM (diabetes mellitus), type 2, uncontrolled ([place]) 05/06/2008   Qualifier: Diagnosis of  By: Drue Flirt  MD, Merrily Brittle    . Essential hypertension, benign 05/06/2008   Qualifier: Diagnosis of  By: Jamal Collin MD, Ovid Curd    . Morbid obesity ([place]) 05/06/2008   Qualifier: Diagnosis of  By: Jamal Collin MD, Ovid Curd       Family History  Problem Relation Age of Onset  . Diabetes Mother   . Hypertension Mother   . Diabetes Maternal Aunt   . Diabetes Maternal Grandmother   . Hypertension Father      Current Outpatient Medications:  .  blood glucose meter kit and supplies KIT, Dispense based on patient and insurance preference. Use up to four times daily as directed. (FOR ICD-9 250.00, 250.01)., Disp: 1 each, Rfl: 0 .  citalopram (CELEXA) 10 MG tablet, Take 1 tablet (10 mg total) by mouth daily., Disp: 30 tablet, Rfl: 2 .  glucose blood (ACCU-CHEK AVIVA PLUS) test strip, Use to check blood sugars twice daily E11.69, Disp: 100 each, Rfl: 2 .  labetalol (NORMODYNE) 100 MG tablet, TAKE 1 TABLET BY MOUTH EVERY DAY, Disp: 90 tablet, Rfl: 0 .  Magnesium 200 MG TABS, Take 1 tablet by mouth daily with evening meal, Disp: 90 tablet, Rfl: 1 .  metFORMIN (GLUCOPHAGE-XR) 500 MG 24 hr tablet, TAKE 1 TABLET  BY MOUTH EVERY DAY WITH BREAKFAST, Disp: 90 tablet, Rfl: 1 .  ondansetron (ZOFRAN) 4 MG tablet, TAKE 1 TABLET BY MOUTH EVERY 8 HOURS AS NEEDED FOR NAUSEA AND VOMITING, Disp: 30 tablet, Rfl: 1 .  Semaglutide (RYBELSUS) 14 MG TABS, Take 1 tablet by mouth daily. Take 30 minutes before eating breakfast, Disp: 30 tablet, Rfl: 5 .  ferrous sulfate 325 (65 FE) MG tablet, TAKE 1 TABLET BY MOUTH EVERY DAY, Disp: 90  tablet, Rfl: 1 .  insulin degludec (TRESIBA FLEXTOUCH) 100 UNIT/ML FlexTouch Pen, Inject 15 Units into the skin daily., Disp: 15 mL, Rfl: 1   No Known Allergies   Review of Systems  Constitutional: Positive for fatigue.  Respiratory: Negative.   Cardiovascular: Negative for chest pain, palpitations and leg swelling.  Endocrine: Negative for polydipsia, polyphagia and polyuria.  Neurological: Negative for dizziness and headaches.  Psychiatric/Behavioral: The patient is nervous/anxious.      Today's Vitals   03/23/21 1416  BP: 140/82  Pulse: 72  Temp: 98 F (36.7 C)  TempSrc: Oral  Weight: 226 lb (102.5 kg)  Height: 5' 5.6" (1.666 m)   Body mass index is 36.92 kg/m.  Wt Readings from Last 3 Encounters:  03/23/21 226 lb (102.5 kg)  12/22/20 231 lb (104.8 kg)  09/20/20 246 lb (111.6 kg)   Objective:  Physical Exam Vitals reviewed.  Constitutional:      General: She is not in acute distress.    Appearance: Normal appearance.  Cardiovascular:     Rate and Rhythm: Normal rate and regular rhythm.     Pulses: Normal pulses.     Heart sounds: No murmur heard.   Pulmonary:     Effort: Pulmonary effort is normal. No respiratory distress.     Breath sounds: Normal breath sounds. No wheezing.  Neurological:     General: No focal deficit present.     Mental Status: She is alert and oriented to person, place, and time.     Cranial Nerves: No cranial nerve deficit.  Psychiatric:        Mood and Affect: Mood normal.        Behavior: Behavior normal.        Thought Content: Thought content normal.        Judgment: Judgment normal.         Assessment And Plan:     1. Essential hypertension, benign . B/P is fairly controlled.  . CMP ordered to check renal function.  . The importance of regular exercise and dietary modification was stressed to the patient.  - CMP14+EGFR - Comprehensive metabolic panel  2. Morbid obesity (HCC)  Chronic  Discussed healthy diet and  regular exercise options   Encouraged to exercise at least 150 minutes per week with 2 days of strength training  Congratulated on her 5 lb weight loss  She is encouraged to strive for BMI less than 30 to decrease cardiac risk.   3. Type 2 diabetes mellitus with hyperglycemia, with long-term current use of insulin (HCC)  Chronic, she is doing better with her HgbA1c and blood sugars  She is to decrease her tresiba to 15 units daily, we will try to continue weaning her off. - insulin degludec (TRESIBA FLEXTOUCH) 100 UNIT/ML FlexTouch Pen; Inject 15 Units into the skin daily.  Dispense: 15 mL; Refill: 1 - CMP14+EGFR - Hemoglobin A1c - Comprehensive metabolic panel  4. Depression with anxiety  Will start her on celexa  I will also refer her to psychiatry  due to her feeling like she is on edge and having concentration issues - citalopram (CELEXA) 10 MG tablet; Take 1 tablet (10 mg total) by mouth daily.  Dispense: 30 tablet; Refill: 2 - Ambulatory referral to Psychiatry   Patient was given opportunity to ask questions. Patient verbalized understanding of the plan and was able to repeat key elements of the plan. All questions were answered to their satisfaction.  [name], FNP   I, [name], FNP, have reviewed all documentation for this visit. The documentation on 03/23/21 for the exam, diagnosis, procedures, and orders are all accurate and complete.   IF YOU HAVE BEEN REFERRED TO A SPECIALIST, IT MAY TAKE 1-2 WEEKS TO SCHEDULE/PROCESS THE REFERRAL. IF YOU HAVE NOT HEARD FROM US/SPECIALIST IN TWO WEEKS, PLEASE GIVE [place] A CALL AT [phone].   THE PATIENT IS ENCOURAGED TO PRACTICE SOCIAL DISTANCING DUE TO THE COVID-19 PANDEMIC.

## 2021-03-24 ENCOUNTER — Other Ambulatory Visit: Payer: Self-pay | Admitting: Nurse Practitioner

## 2021-03-24 LAB — HEMOGLOBIN A1C
Hgb A1c MFr Bld: 5.8 % of total Hgb — ABNORMAL HIGH (ref <5.7)
Mean Plasma Glucose: 120 mg/dL
eAG (mmol/L): 6.6 mmol/L

## 2021-03-24 LAB — COMPLETE METABOLIC PANEL WITH GFR
AG Ratio: 1.6 (calc) (ref 1.0–2.5)
ALT: <3 U/L — ABNORMAL LOW (ref 6–29)
AST: 8 U/L — ABNORMAL LOW (ref 10–30)
Albumin: 3.9 g/dL (ref 3.6–5.1)
Alkaline phosphatase (APISO): 76 U/L (ref 31–125)
BUN: 11 mg/dL (ref 7–25)
CO2: 26 mmol/L (ref 20–32)
Calcium: 8.9 mg/dL (ref 8.6–10.2)
Chloride: 106 mmol/L (ref 98–110)
Creat: 0.67 mg/dL (ref 0.50–1.10)
GFR, Est African American: 131 mL/min/{1.73_m2} (ref >=60)
GFR, Est Non African American: 113 mL/min/{1.73_m2} (ref >=60)
Globulin: 2.5 g/dL (calc) (ref 1.9–3.7)
Glucose, Bld: 103 mg/dL — ABNORMAL HIGH (ref 65–99)
Potassium: 4.5 mmol/L (ref 3.5–5.3)
Sodium: 140 mmol/L (ref 135–146)
Total Bilirubin: 0.4 mg/dL (ref 0.2–1.2)
Total Protein: 6.4 g/dL (ref 6.1–8.1)

## 2021-04-03 ENCOUNTER — Encounter: Payer: Self-pay | Admitting: Nurse Practitioner

## 2021-04-04 ENCOUNTER — Other Ambulatory Visit: Payer: Self-pay | Admitting: Nurse Practitioner

## 2021-04-04 DIAGNOSIS — I10 Essential (primary) hypertension: Secondary | ICD-10-CM

## 2021-04-05 ENCOUNTER — Encounter: Payer: Self-pay | Admitting: Nurse Practitioner

## 2021-04-07 NOTE — Progress Notes (Signed)
I would prefer she come in before hand as a low vitamin B12 can lead to dementia in the future

## 2021-04-14 ENCOUNTER — Other Ambulatory Visit: Payer: Self-pay

## 2021-04-15 ENCOUNTER — Other Ambulatory Visit: Payer: Self-pay | Admitting: Nurse Practitioner

## 2021-04-15 DIAGNOSIS — F418 Other specified anxiety disorders: Secondary | ICD-10-CM

## 2021-04-18 ENCOUNTER — Other Ambulatory Visit: Payer: BC Managed Care – PPO

## 2021-04-18 ENCOUNTER — Other Ambulatory Visit: Payer: Self-pay

## 2021-04-18 DIAGNOSIS — R5383 Other fatigue: Secondary | ICD-10-CM

## 2021-04-19 ENCOUNTER — Encounter: Payer: Self-pay | Admitting: Nurse Practitioner

## 2021-04-19 LAB — VITAMIN B12: Vitamin B-12: 571 pg/mL (ref 200–1100)

## 2021-05-04 ENCOUNTER — Ambulatory Visit: Payer: BC Managed Care – PPO | Admitting: Nurse Practitioner

## 2021-05-25 ENCOUNTER — Other Ambulatory Visit: Payer: Self-pay

## 2021-05-25 ENCOUNTER — Encounter: Payer: Self-pay | Admitting: Nurse Practitioner

## 2021-05-25 ENCOUNTER — Ambulatory Visit: Payer: BC Managed Care – PPO | Admitting: Nurse Practitioner

## 2021-05-25 VITALS — BP 124/80 | HR 56 | Temp 98.1°F | Ht 64.2 in | Wt 222.2 lb

## 2021-05-25 DIAGNOSIS — F418 Other specified anxiety disorders: Secondary | ICD-10-CM | POA: Diagnosis not present

## 2021-05-25 DIAGNOSIS — E1165 Type 2 diabetes mellitus with hyperglycemia: Secondary | ICD-10-CM | POA: Diagnosis not present

## 2021-05-25 DIAGNOSIS — Z794 Long term (current) use of insulin: Secondary | ICD-10-CM | POA: Diagnosis not present

## 2021-05-25 MED ORDER — METFORMIN HCL ER 750 MG PO TB24: 750.0000 mg | ORAL_TABLET | Freq: Every day | ORAL | 11 refills | Status: DC

## 2021-05-25 NOTE — Progress Notes (Signed)
I,Yamilka Roman [employer] as a Education administrator for [employer], FNP.,have documented all relevant documentation on the behalf of [name], FNP,as directed by  [name], FNP while in the presence of [name], [place].   This visit occurred during the SARS-CoV-2 public health emergency.  Safety protocols were in place, including screening questions prior to the visit, additional usage of staff PPE, and extensive cleaning of exam room while observing appropriate contact time as indicated for disinfecting solutions.  Subjective:     Patient ID: Krista Adams , female    DOB: 03-15-1986 , 35 y.o.   MRN: [id number]   Chief Complaint  Patient presents with   Depression    HPI  Patient presents today for a f/u on depression and anxiety. She is tolerating the medication well.   Wt Readings from Last 3 Encounters: 05/25/21 : 222 lb 3.2 oz (100.8 kg) 03/23/21 : 226 lb (102.5 kg) 12/22/20 : 231 lb (104.8 kg)    Depression        This is a chronic problem.  The current episode started more than 1 month ago.   The onset quality is gradual.   Associated symptoms include no decreased concentration, no fatigue, does not have insomnia and no headaches.  Past treatments include SSRIs - Selective serotonin reuptake inhibitors.  Previous treatment provided no relief relief.   Pertinent negatives include  and no hypothyroidism.   Past Medical History:  Diagnosis Date   Anovulatory (dysfunctional uterine) bleeding 03/13/2012   Anxiety    Depression    Depression with anxiety 03/18/2012   Diabetes mellitus without complication (HCC)    DM (diabetes mellitus), type 2, uncontrolled ([place]) 05/06/2008   Qualifier: Diagnosis of  By: Drue Flirt  MD, [place]     Essential hypertension, benign 05/06/2008   Qualifier: Diagnosis of  By: Jamal Collin MD, [place]     Morbid obesity ([place]) 05/06/2008   Qualifier: Diagnosis of  By: Jamal Collin MD, Ovid Curd       Family History  Problem Relation Age of Onset   Diabetes Mother     Hypertension Mother    Diabetes Maternal Aunt    Diabetes Maternal Grandmother    Hypertension Father      Current Outpatient Medications:    blood glucose meter kit and supplies KIT, Dispense based on patient and insurance preference. Use up to four times daily as directed. (FOR ICD-9 250.00, 250.01)., Disp: 1 each, Rfl: 0   citalopram (CELEXA) 10 MG tablet, TAKE 1 TABLET BY MOUTH EVERY DAY, Disp: 90 tablet, Rfl: 1   ferrous sulfate 325 (65 FE) MG tablet, TAKE 1 TABLET BY MOUTH EVERY DAY, Disp: 90 tablet, Rfl: 1   glucose blood (ACCU-CHEK AVIVA PLUS) test strip, Use to check blood sugars twice daily E11.69, Disp: 100 each, Rfl: 2   insulin degludec (TRESIBA FLEXTOUCH) 100 UNIT/ML FlexTouch Pen, Inject 15 Units into the skin daily., Disp: 15 mL, Rfl: 1   labetalol (NORMODYNE) 100 MG tablet, TAKE 1 TABLET BY MOUTH EVERY DAY, Disp: 90 tablet, Rfl: 0   Magnesium 200 MG TABS, Take 1 tablet by mouth daily with evening meal, Disp: 90 tablet, Rfl: 1   metFORMIN (GLUCOPHAGE-XR) 500 MG 24 hr tablet, TAKE 1 TABLET BY MOUTH EVERY DAY WITH BREAKFAST, Disp: 90 tablet, Rfl: 1   ondansetron (ZOFRAN) 4 MG tablet, TAKE 1 TABLET BY MOUTH EVERY 8 HOURS AS NEEDED FOR NAUSEA AND VOMITING, Disp: 30 tablet, Rfl: 1   Semaglutide (RYBELSUS) 14 MG TABS, Take 1 tablet  by mouth daily. Take 30 minutes before eating breakfast, Disp: 30 tablet, Rfl: 5   No Known Allergies   Review of Systems  Constitutional:  Negative for fatigue.  Respiratory: Negative.    Cardiovascular: Negative.  Negative for chest pain, palpitations and leg swelling.  Neurological:  Negative for dizziness and headaches.  Psychiatric/Behavioral:  Positive for depression. Negative for decreased concentration. The patient does not have insomnia.     Today's Vitals   05/25/21 1546  BP: 124/80  Pulse: (!) 56  Temp: 98.1 F (36.7 C)  Weight: 222 lb 3.2 oz (100.8 kg)  Height: 5' 4.2" (1.631 m)   Body mass index is 37.9 kg/m.   Objective:   Physical Exam Vitals reviewed.  Constitutional:      General: She is not in acute distress.    Appearance: Normal appearance.  Cardiovascular:     Rate and Rhythm: Normal rate and regular rhythm.     Pulses: Normal pulses.     Heart sounds: No murmur heard. Pulmonary:     Effort: Pulmonary effort is normal. No respiratory distress.     Breath sounds: Normal breath sounds. No wheezing.  Neurological:     General: No focal deficit present.     Mental Status: She is alert and oriented to person, place, and time.     Cranial Nerves: No cranial nerve deficit.  Psychiatric:        Mood and Affect: Mood normal.        Behavior: Behavior normal.        Thought Content: Thought content normal.        Judgment: Judgment normal.        Assessment And Plan:     1. Depression with anxiety She is doing well with the citalopram Continue at current dose  2. Type 2 diabetes mellitus with hyperglycemia, with long-term current use of insulin ([place]) She is doing much better and only taking rybelsus, no longer on tresiba   Patient was given opportunity to ask questions. Patient verbalized understanding of the plan and was able to repeat key elements of the plan. All questions were answered to their satisfaction.  [name], FNP   I, [name], FNP, have reviewed all documentation for this visit. The documentation on 05/25/21 for the exam, diagnosis, procedures, and orders are all accurate and complete.   IF YOU HAVE BEEN REFERRED TO A SPECIALIST, IT MAY TAKE 1-2 WEEKS TO SCHEDULE/PROCESS THE REFERRAL. IF YOU HAVE NOT HEARD FROM US/SPECIALIST IN TWO WEEKS, PLEASE GIVE [place] A CALL AT [phone].   THE PATIENT IS ENCOURAGED TO PRACTICE SOCIAL DISTANCING DUE TO THE COVID-19 PANDEMIC.

## 2021-05-25 NOTE — Patient Instructions (Signed)
Get SPF of 30 or more sunscreen

## 2021-05-31 ENCOUNTER — Other Ambulatory Visit: Payer: Self-pay

## 2021-05-31 DIAGNOSIS — E1165 Type 2 diabetes mellitus with hyperglycemia: Secondary | ICD-10-CM

## 2021-05-31 MED ORDER — RYBELSUS 14 MG PO TABS: 1.0000 | ORAL_TABLET | Freq: Every day | ORAL | 1 refills | Status: DC

## 2021-07-06 ENCOUNTER — Other Ambulatory Visit: Payer: Self-pay | Admitting: Nurse Practitioner

## 2021-07-06 ENCOUNTER — Other Ambulatory Visit: Payer: Self-pay

## 2021-07-06 DIAGNOSIS — I10 Essential (primary) hypertension: Secondary | ICD-10-CM

## 2021-07-06 DIAGNOSIS — E1165 Type 2 diabetes mellitus with hyperglycemia: Secondary | ICD-10-CM

## 2021-07-06 MED ORDER — LABETALOL HCL 100 MG PO TABS: 100.0000 mg | ORAL_TABLET | Freq: Every day | ORAL | 1 refills | Status: DC

## 2021-07-19 ENCOUNTER — Other Ambulatory Visit: Payer: Self-pay

## 2021-07-19 ENCOUNTER — Emergency Department (HOSPITAL_COMMUNITY): Payer: BC Managed Care – PPO

## 2021-07-19 ENCOUNTER — Emergency Department (HOSPITAL_COMMUNITY)
Admission: EM | Admit: 2021-07-19 | Discharge: 2021-07-19 | Disposition: A | Discharge status: Home / Self Care | Payer: BC Managed Care – PPO | Attending: Emergency Medicine | Admitting: Emergency Medicine

## 2021-07-19 ENCOUNTER — Encounter (HOSPITAL_COMMUNITY): Payer: Self-pay

## 2021-07-19 DIAGNOSIS — Z79899 Other long term (current) drug therapy: Secondary | ICD-10-CM | POA: Insufficient documentation

## 2021-07-19 DIAGNOSIS — K5900 Constipation, unspecified: Secondary | ICD-10-CM | POA: Diagnosis not present

## 2021-07-19 DIAGNOSIS — E119 Type 2 diabetes mellitus without complications: Secondary | ICD-10-CM | POA: Diagnosis not present

## 2021-07-19 DIAGNOSIS — Z7984 Long term (current) use of oral hypoglycemic drugs: Secondary | ICD-10-CM | POA: Insufficient documentation

## 2021-07-19 DIAGNOSIS — R102 Pelvic and perineal pain: Secondary | ICD-10-CM | POA: Insufficient documentation

## 2021-07-19 DIAGNOSIS — Z794 Long term (current) use of insulin: Secondary | ICD-10-CM | POA: Insufficient documentation

## 2021-07-19 DIAGNOSIS — F17210 Nicotine dependence, cigarettes, uncomplicated: Secondary | ICD-10-CM | POA: Insufficient documentation

## 2021-07-19 DIAGNOSIS — R112 Nausea with vomiting, unspecified: Secondary | ICD-10-CM

## 2021-07-19 DIAGNOSIS — I10 Essential (primary) hypertension: Secondary | ICD-10-CM | POA: Diagnosis not present

## 2021-07-19 DIAGNOSIS — K529 Noninfective gastroenteritis and colitis, unspecified: Secondary | ICD-10-CM | POA: Diagnosis not present

## 2021-07-19 LAB — CBC WITH DIFFERENTIAL/PLATELET
Abs Immature Granulocytes: 0.02 10*3/uL (ref 0.00–0.07)
Basophils Absolute: 0 10*3/uL (ref 0.0–0.1)
Basophils Relative: 0 %
Eosinophils Absolute: 0.1 10*3/uL (ref 0.0–0.5)
Eosinophils Relative: 2 %
HCT: 40.3 % (ref 36.0–46.0)
Hemoglobin: 12.8 g/dL (ref 12.0–15.0)
Immature Granulocytes: 0 %
Lymphocytes Relative: 30 %
Lymphs Abs: 1.4 10*3/uL (ref 0.7–4.0)
MCH: 29.4 pg (ref 26.0–34.0)
MCHC: 31.8 g/dL (ref 30.0–36.0)
MCV: 92.4 fL (ref 80.0–100.0)
Monocytes Absolute: 0.3 10*3/uL (ref 0.1–1.0)
Monocytes Relative: 6 %
Neutro Abs: 2.9 10*3/uL (ref 1.7–7.7)
Neutrophils Relative %: 62 %
Platelets: 276 10*3/uL (ref 150–400)
RBC: 4.36 MIL/uL (ref 3.87–5.11)
RDW: 14.2 % (ref 11.5–15.5)
WBC: 4.7 10*3/uL (ref 4.0–10.5)
nRBC: 0 % (ref 0.0–0.2)

## 2021-07-19 LAB — COMPREHENSIVE METABOLIC PANEL
ALT: 7 U/L (ref 0–44)
AST: 19 U/L (ref 15–41)
Albumin: 3.7 g/dL (ref 3.5–5.0)
Alkaline Phosphatase: 78 U/L (ref 38–126)
Anion gap: 7 (ref 5–15)
BUN: 8 mg/dL (ref 6–20)
CO2: 24 mmol/L (ref 22–32)
Calcium: 9 mg/dL (ref 8.9–10.3)
Chloride: 106 mmol/L (ref 98–111)
Creatinine, Ser: 0.69 mg/dL (ref 0.44–1.00)
GFR, Estimated: >60 mL/min (ref >60)
Glucose, Bld: 136 mg/dL — ABNORMAL HIGH (ref 70–99)
Potassium: 4.5 mmol/L (ref 3.5–5.1)
Sodium: 137 mmol/L (ref 135–145)
Total Bilirubin: 0.5 mg/dL (ref 0.3–1.2)
Total Protein: 6.6 g/dL (ref 6.5–8.1)

## 2021-07-19 LAB — URINALYSIS, ROUTINE W REFLEX MICROSCOPIC
Bilirubin Urine: NEGATIVE
Glucose, UA: NEGATIVE mg/dL
Hgb urine dipstick: NEGATIVE
Ketones, ur: 5 mg/dL — AB
Leukocytes,Ua: NEGATIVE
Nitrite: NEGATIVE
Protein, ur: NEGATIVE mg/dL
Specific Gravity, Urine: 1.043 — ABNORMAL HIGH (ref 1.005–1.030)
pH: 6 (ref 5.0–8.0)

## 2021-07-19 LAB — I-STAT BETA HCG BLOOD, ED (MC, WL, AP ONLY): I-stat hCG, quantitative: <5 m[IU]/mL (ref <5)

## 2021-07-19 LAB — LIPASE, BLOOD: Lipase: 35 U/L (ref 11–51)

## 2021-07-19 MED ORDER — PROMETHAZINE HCL 25 MG/ML IJ SOLN
25.0000 mg | Freq: Four times a day (QID) | INTRAMUSCULAR | Status: DC | PRN
Start: 2021-07-19 — End: 2021-07-19
  Administered 2021-07-19: 25 mg via INTRAVENOUS
  Filled 2021-07-19: qty 1

## 2021-07-19 MED ORDER — PROMETHAZINE HCL 25 MG PO TABS
25.0000 mg | ORAL_TABLET | Freq: Four times a day (QID) | ORAL | 0 refills | Status: DC | PRN
Start: 2021-07-19 — End: 2022-01-11

## 2021-07-19 MED ORDER — MORPHINE SULFATE (PF) 2 MG/ML IV SOLN
2.0000 mg | Freq: Once | INTRAVENOUS | Status: AC
  Administered 2021-07-19: 2 mg via INTRAVENOUS
  Filled 2021-07-19: qty 1

## 2021-07-19 MED ORDER — SODIUM CHLORIDE 0.9 % IV BOLUS
1000.0000 mL | Freq: Once | INTRAVENOUS | Status: AC
  Administered 2021-07-19: 1000 mL via INTRAVENOUS

## 2021-07-19 MED ORDER — ONDANSETRON 4 MG PO TBDP
4.0000 mg | ORAL_TABLET | Freq: Once | ORAL | Status: AC
  Administered 2021-07-19: 4 mg via ORAL
  Filled 2021-07-19: qty 1

## 2021-07-19 MED ORDER — IOHEXOL 350 MG/ML SOLN
80.0000 mL | Freq: Once | INTRAVENOUS | Status: AC | PRN
  Administered 2021-07-19: 80 mL via INTRAVENOUS

## 2021-07-19 NOTE — ED Triage Notes (Signed)
Pt reports lower abd pain and emesis. Pain so bad that she cannot walk. LMP 2 weeks ago.

## 2021-07-19 NOTE — ED Provider Notes (Signed)
[facility] EMERGENCY DEPARTMENT Provider Note   CSN: [id number] Arrival date & time: 07/19/21  0736    History              Chief Complaint    Chief Complaint  Patient presents with   Abdominal Pain   Emesis      HPI Krista Adams is a 35 y.o. female, PMH significant for cholecystectomy, depression with anxiety, HTN, and DMT2, who presents to the ED today for abdominal pain and emesis. Notes increased urinary frequency yesterday then woke up this morning with 9/10 abdominal pain that made it difficult to stand up straight or walk. States she has had this intermittent abdominal pain for the past few months beginning before her cholecystectomy. Describes as a "soreness" starting in her suprapubic region and moves to her entire abdomen. Occurs randomly and is worse with movement. Experiences nausea and weakness with episodes. Not associated with eating or defecation. Thought she had to have a BM this morning so sat on the toilet and had a small diarrheal episode with emesis. States she does not have regular BMs and vomits while on the toilet at times due to the abdominal pain. Pain radiates to her right back and is worse than it has ever been. Took a tylenol at home that helped a little. Current pain is a 7/10. Does not note eating anything that may have caused her symptoms. Endorses lack of appetite due to rubelsys. Denies new medications. Notes her symptoms worsened a month ago when her metformin was increased.    LMP was 8/7. Is unsure if this is similar to period cramps. Endorses irregular periods. Sexually active with one female partner. Does not use protection and has never been pregnant. Does not smoke tobacco. Drinks "once in a blue moon." Endorses marijuana use. Denies fevers, chills, HA, presyncope, chest pain, pain/burning with urination, increased vaginal discharge, or foul odor. No sick contacts.     Abdominal Pain Associated symptoms: constipation, diarrhea,  nausea and vomiting   Associated symptoms: no chest pain, no chills, no cough, no dysuria, no fever, no hematuria, no shortness of breath and no vaginal discharge   Emesis Associated symptoms: abdominal pain and diarrhea   Associated symptoms: no chills, no cough, no fever and no headaches         Past Medical History:  Diagnosis Date   Anovulatory (dysfunctional uterine) bleeding 03/13/2012   Anxiety     Depression     Depression with anxiety 03/18/2012   Diabetes mellitus without complication ([place])     DM (diabetes mellitus), type 2, uncontrolled ([place]) 05/06/2008    Qualifier: Diagnosis of  By: Drue Flirt  MD, [place]     Essential hypertension, benign 05/06/2008    Qualifier: Diagnosis of  By: Jamal Collin MD, [place]     Morbid obesity ([place]) 05/06/2008    Qualifier: Diagnosis of  By: Jamal Collin MD, [facility]            Patient Active Problem List    Diagnosis Date Noted   Left arm numbness 05/09/2014   Left shoulder pain 05/09/2014   Family planning 03/29/2013   Insomnia 06/29/2012   Depression with anxiety 03/18/2012   Anovulatory (dysfunctional uterine) bleeding 03/13/2012   Contraception management 10/28/2011   Nevus of toe 09/24/2011   HIP PAIN, LEFT 01/09/2010   KNEE PAIN, LEFT 01/09/2010   DM (diabetes mellitus), type 2, uncontrolled ([place]) 05/06/2008   Morbid obesity ([place]) 05/06/2008   ESSENTIAL HYPERTENSION, BENIGN 05/06/2008  History reviewed. No pertinent surgical history.   OB History       Gravida  0   Para      Term      Preterm      AB      Living           SAB      IAB      Ectopic      Multiple      Live Births                    Home Medications                       Prior to Admission medications   Medication Sig Start Date End Date Taking? Authorizing Provider  blood glucose meter kit and supplies KIT Dispense based on patient and insurance preference. Use up to four times daily as directed. (FOR ICD-9 250.00, 250.01). 01/12/20     [name], FNP  citalopram (CELEXA) 10 MG tablet TAKE 1 TABLET BY MOUTH EVERY DAY 04/18/21     [name], FNP  ferrous sulfate 325 (65 FE) MG tablet TAKE 1 TABLET BY MOUTH EVERY DAY 03/24/21     [name], FNP  glucose blood (ACCU-CHEK AVIVA PLUS) test strip Use to check blood sugars twice daily E11.69 02/20/21     [name], FNP  labetalol (NORMODYNE) 100 MG tablet Take 1 tablet (100 mg total) by mouth daily. 07/06/21     [name], FNP  Magnesium 200 MG TABS Take 1 tablet by mouth daily with evening meal 03/09/20     [name], FNP  metFORMIN (GLUCOPHAGE XR) 750 MG 24 hr tablet Take 1 tablet (750 mg total) by mouth daily with breakfast. 05/25/21 05/25/22   [name], FNP  ondansetron (ZOFRAN) 4 MG tablet TAKE 1 TABLET BY MOUTH EVERY 8 HOURS AS NEEDED FOR NAUSEA AND VOMITING 09/15/20     [name], FNP  Semaglutide (RYBELSUS) 14 MG TABS Take 1 tablet by mouth daily. Take 30 minutes before eating breakfast 05/31/21     [name], FNP  Liraglutide (VICTOZA) 18 MG/3ML SOLN Inject 0.3 mLs (1.8 mg total) into the skin daily. 10/17/11 02/27/12   Luetta Nutting, DO      Family History      Family History  Problem Relation Age of Onset   Diabetes Mother     Hypertension Mother     Diabetes Maternal Aunt     Diabetes Maternal Grandmother     Hypertension Father        Social History Social History         Tobacco Use   Smoking status: Every Day      Packs/day: 1.00      Types: Cigars, Cigarettes   Smokeless tobacco: Never   Tobacco comments:      smokes black and mild ; reports not smoking as much  Substance Use Topics   Alcohol use: No   Drug use: No        Allergies              Pineapple     Review of Systems Review of Systems  Constitutional:  Positive for activity change and appetite change. Negative for chills and fever.  HENT: Negative.    Eyes: Negative.   Respiratory:  Negative for cough, chest tightness and shortness of breath.   Cardiovascular:   Negative for chest pain,  palpitations and leg swelling.  Gastrointestinal:  Positive for abdominal pain, constipation, diarrhea, nausea and vomiting. Negative for blood in stool.  Endocrine: Negative.   Genitourinary:  Positive for frequency. Negative for difficulty urinating, dysuria, hematuria, vaginal discharge and vaginal pain.  Musculoskeletal:  Positive for gait problem.  Skin: Negative.   Neurological:  Positive for weakness. Negative for dizziness, numbness and headaches.  Psychiatric/Behavioral: Negative.        Physical Exam Updated Vital Signs BP (!) 178/94 (BP Location: Left Arm)   Pulse (!) 54   Temp 98.5 F (36.9 C) (Oral)   Resp 15   Ht $R'5\' 4"'TL$  (1.626 m)   Wt 99.8 kg   LMP 07/05/2021 (Approximate)   SpO2 100%   BMI 37.76 kg/m    Physical Exam Vitals reviewed.  Constitutional:      General: She is not in acute distress.    Appearance: She is obese. She is not ill-appearing.  HENT:     Head: Normocephalic.     Mouth/Throat:     Mouth: Mucous membranes are moist.     Pharynx: Oropharynx is clear.  Eyes:     Pupils: Pupils are equal, round, and reactive to light.  Cardiovascular:     Rate and Rhythm: Normal rate and regular rhythm.     Heart sounds: Normal heart sounds.  Pulmonary:     Effort: Pulmonary effort is normal.  Chest:     Chest wall: No tenderness.  Abdominal:     General: Bowel sounds are normal. There is no distension. There are no signs of injury.     Palpations: Abdomen is soft.     Tenderness: There is generalized abdominal tenderness. There is no right CVA tenderness, left CVA tenderness or rebound. Negative signs include Rovsing's sign and McBurney's sign.     Comments: Diffuse abdominal tenderness worse in RLQ  Skin:    General: Skin is warm and dry.     Capillary Refill: Capillary refill takes less than 2 seconds.  Neurological:     General: No focal deficit present.     Mental Status: She is alert.  Psychiatric:        Mood and  Affect: Affect is tearful.        ED Treatments / Results  Labs (all labs ordered are listed, but only abnormal results are displayed)      Labs Reviewed  COMPREHENSIVE METABOLIC PANEL - Abnormal; Notable for the following components:      Result Value     Glucose, Bld 136 (*)      All other components within normal limits  CBC WITH DIFFERENTIAL/PLATELET  LIPASE, BLOOD  URINALYSIS, ROUTINE W REFLEX MICROSCOPIC  I-STAT BETA HCG BLOOD, ED ([facility], [facility], [facility] ONLY)      EKG   Radiology  Imaging Results (Last 48 hours)  CT Abdomen Pelvis W Contrast   Result Date: 07/19/2021 CLINICAL DATA:  Abdominal pain.  Severe EXAM: CT ABDOMEN AND PELVIS WITH CONTRAST TECHNIQUE: Multidetector CT imaging of the abdomen and pelvis was performed using the standard protocol following bolus administration of intravenous contrast. CONTRAST:  75mL OMNIPAQUE IOHEXOL 350 MG/ML SOLN COMPARISON:  CT Abdomen Pelvis, 07/08/2014 and NM HIDA, 07/14/2020. Abdominal ultrasound, 07/14/2020. FINDINGS: Lower chest: No acute abnormality. Hepatobiliary: No focal liver abnormality is seen. Status post cholecystectomy. No biliary dilatation. Pancreas: Unremarkable. No pancreatic ductal dilatation or surrounding inflammatory changes. Spleen: Normal in size without focal abnormality. Adrenals/Urinary Tract: 1.4 cm left adrenal myelolipoma, additional subcentimeter fat-containing lesion  is too small to adequately characterize but likely an additional myelolipoma. The right adrenal gland is unremarkable. Kidneys are normal, without renal calculi, focal lesion, or hydronephrosis. Bladder is unremarkable. Stomach/Bowel: Stomach is within normal limits. Mild air-and fluid distension of small bowel, no bowel wall thickening. Appendix appears normal. Nondilated colon Vascular/Lymphatic: No significant vascular findings are present. No enlarged abdominal or pelvic lymph nodes. Reproductive: Uterus and bilateral adnexa are unremarkable. Other: Small  fat-containing umbilical hernia. Trace pelvic ascites, physiologic in a female of child-bearing age. Musculoskeletal: No acute or significant osseous findings. IMPRESSION: 1. Mild fluid distension of small bowel, within normal limits though may be seen in enteritis. 2. Interval cholecystectomy. 3. Incidental, small left adrenal myelolipoma without additional follow-up indicated. Electronically Signed   By: Michaelle Birks M.D.   On: 07/19/2021 10:32       Procedures Procedures (including critical care time)   Medications Ordered in ED Medications  morphine 2 MG/ML injection 2 mg (has no administration in time range)  promethazine (PHENERGAN) 25 mg in sodium chloride 0.9 % 50 mL IVPB (has no administration in time range)  ondansetron (ZOFRAN-ODT) disintegrating tablet 4 mg (4 mg Oral Given 07/19/21 0753)  iohexol (OMNIPAQUE) 350 MG/ML injection 80 mL (80 mLs Intravenous Contrast Given 07/19/21 1013)       ED Course  I have reviewed the triage vital signs and the nursing notes.  Pertinent labs & imaging results that were available during my care of the patient were reviewed by me and considered in my medical decision making (see chart for details).    MDM Rules/Calculators/A&P                           Patient presents today with nausea, vomiting, and abdominal pain.  She tolerated liquids here in the ED.  She has received pain medicine, antiemetics, and feels somewhat improved.  CT reviewed and no evidence of acute abnormalities although there is some mild fluid distention of her small bowel consistent with enteritis.  No leukocytosis is noted and electrolytes are within normal limits.  She is tolerating fluids without difficulty.  Plan to send patient home with antiemetics.  We have discussed return precautions and need for follow-up and she voices understanding. Final Clinical Impression(s) / ED Diagnoses Final diagnoses:  Non-intractable vomiting with nausea, unspecified vomiting type   Enteritis    Rx / DC Orders ED Discharge Orders     None        Pattricia Boss, MD 07/19/21 1432

## 2021-07-19 NOTE — Discharge Instructions (Addendum)
Labs are within normal limits CAT scan does not show any acute surgical abnormality although possibly some enteritis which could represent some mild infection, likely viral in nature. There is no specific treatment needed for this other than taking nausea medications and keeping hydrated with fluids. Please take nausea medicine as prescribed.  Drink regular Gatorade mixed about one half and one half with water in small increments which will help you retain the fluid even if you have an episode or diarrhea. Return to the emergency department if you start running high fever or having worsening pain. Please call your doctor to have recheck later this week

## 2021-07-19 NOTE — Medical Student Note (Signed)
[facility]-EMERGENCY DEPT Provider Student Note For educational purposes for Medical, PA and NP students only and not part of the legal medical record.   CSN: [id number] Arrival date & time: 07/19/21  0736      History   Chief Complaint Chief Complaint  Patient presents with   Abdominal Pain   Emesis    HPI Krista Adams is a 35 y.o. female, PMH significant for cholecystectomy, depression with anxiety, HTN, and DMT2, who presents to the ED today for abdominal pain and emesis. Notes increased urinary frequency yesterday then woke up this morning with 9/10 abdominal pain that made it difficult to stand up straight or walk. States she has had this intermittent abdominal pain for the past few months beginning before her cholecystectomy. Describes as a "soreness" starting in her suprapubic region and moves to her entire abdomen. Occurs randomly and is worse with movement. Experiences nausea and weakness with episodes. Not associated with eating or defecation. Thought she had to have a BM this morning so sat on the toilet and had a small diarrheal episode with emesis. States she does not have regular BMs and vomits while on the toilet at times due to the abdominal pain. Pain radiates to her right back and is worse than it has ever been. Took a tylenol at home that helped a little. Current pain is a 7/10. Does not note eating anything that may have caused her symptoms. Endorses lack of appetite due to rubelsys. Denies new medications. Notes her symptoms worsened a month ago when her metformin was increased.   LMP was 8/7. Is unsure if this is similar to period cramps. Endorses irregular periods. Sexually active with one female partner. Does not use protection and has never been pregnant. Does not smoke tobacco. Drinks "once in a blue moon." Endorses marijuana use. Denies fevers, chills, HA, presyncope, chest pain, pain/burning with urination, increased vaginal discharge, or foul odor. No sick  contacts.   Abdominal Pain Associated symptoms: constipation, diarrhea, nausea and vomiting   Associated symptoms: no chest pain, no chills, no cough, no dysuria, no fever, no hematuria, no shortness of breath and no vaginal discharge   Emesis Associated symptoms: abdominal pain and diarrhea   Associated symptoms: no chills, no cough, no fever and no headaches    Past Medical History:  Diagnosis Date   Anovulatory (dysfunctional uterine) bleeding 03/13/2012   Anxiety    Depression    Depression with anxiety 03/18/2012   Diabetes mellitus without complication ([facility])    DM (diabetes mellitus), type 2, uncontrolled ([facility]) 05/06/2008   Qualifier: Diagnosis of  By: Lanier Prude  MD, [place]     Essential hypertension, benign 05/06/2008   Qualifier: Diagnosis of  By: Darrol Angel MD, [place]     Morbid obesity ([facility]) 05/06/2008   Qualifier: Diagnosis of  By: Darrol Angel MD, [facility]      Patient Active Problem List   Diagnosis Date Noted   Left arm numbness 05/09/2014   Left shoulder pain 05/09/2014   Family planning 03/29/2013   Insomnia 06/29/2012   Depression with anxiety 03/18/2012   Anovulatory (dysfunctional uterine) bleeding 03/13/2012   Contraception management 10/28/2011   Nevus of toe 09/24/2011   HIP PAIN, LEFT 01/09/2010   KNEE PAIN, LEFT 01/09/2010   DM (diabetes mellitus), type 2, uncontrolled ([facility]) 05/06/2008   Morbid obesity ([facility]) 05/06/2008   ESSENTIAL HYPERTENSION, BENIGN 05/06/2008    History reviewed. No pertinent surgical history.  OB History     Gravida  0  Para      Term      Preterm      AB      Living         SAB      IAB      Ectopic      Multiple      Live Births               Home Medications    Prior to Admission medications   Medication Sig Start Date End Date Taking? Authorizing Provider  blood glucose meter kit and supplies KIT Dispense based on patient and insurance preference. Use up to four times daily as directed. (FOR ICD-9 250.00,  250.01). 01/12/20   [name], FNP  citalopram (CELEXA) 10 MG tablet TAKE 1 TABLET BY MOUTH EVERY DAY 04/18/21   [name], FNP  ferrous sulfate 325 (65 FE) MG tablet TAKE 1 TABLET BY MOUTH EVERY DAY 03/24/21   [name], FNP  glucose blood (ACCU-CHEK AVIVA PLUS) test strip Use to check blood sugars twice daily E11.69 02/20/21   [name], FNP  labetalol (NORMODYNE) 100 MG tablet Take 1 tablet (100 mg total) by mouth daily. 07/06/21   [name], FNP  Magnesium 200 MG TABS Take 1 tablet by mouth daily with evening meal 03/09/20   [name], FNP  metFORMIN (GLUCOPHAGE XR) 750 MG 24 hr tablet Take 1 tablet (750 mg total) by mouth daily with breakfast. 05/25/21 05/25/22  [name], FNP  ondansetron (ZOFRAN) 4 MG tablet TAKE 1 TABLET BY MOUTH EVERY 8 HOURS AS NEEDED FOR NAUSEA AND VOMITING 09/15/20   [name], FNP  Semaglutide (RYBELSUS) 14 MG TABS Take 1 tablet by mouth daily. Take 30 minutes before eating breakfast 05/31/21   [name], FNP  Liraglutide (VICTOZA) 18 MG/3ML SOLN Inject 0.3 mLs (1.8 mg total) into the skin daily. 10/17/11 02/27/12  Luetta Nutting, DO    Family History Family History  Problem Relation Age of Onset   Diabetes Mother    Hypertension Mother    Diabetes Maternal Aunt    Diabetes Maternal Grandmother    Hypertension Father     Social History Social History   Tobacco Use   Smoking status: Every Day    Packs/day: 1.00    Types: Cigars, Cigarettes   Smokeless tobacco: Never   Tobacco comments:    smokes black and mild ; reports not smoking as much  Substance Use Topics   Alcohol use: No   Drug use: No     Allergies   Pineapple   Review of Systems Review of Systems  Constitutional:  Positive for activity change and appetite change. Negative for chills and fever.  HENT: Negative.    Eyes: Negative.   Respiratory:  Negative for cough, chest tightness and shortness of breath.   Cardiovascular:  Negative for chest pain,  palpitations and leg swelling.  Gastrointestinal:  Positive for abdominal pain, constipation, diarrhea, nausea and vomiting. Negative for blood in stool.  Endocrine: Negative.   Genitourinary:  Positive for frequency. Negative for difficulty urinating, dysuria, hematuria, vaginal discharge and vaginal pain.  Musculoskeletal:  Positive for gait problem.  Skin: Negative.   Neurological:  Positive for weakness. Negative for dizziness, numbness and headaches.  Psychiatric/Behavioral: Negative.      Physical Exam Updated Vital Signs BP (!) 178/94 (BP Location: Left Arm)   Pulse (!) 54   Temp 98.5 F (36.9 C) (Oral)   Resp 15   Ht $R'5\' 4"'pl$  (  1.626 m)   Wt 99.8 kg   LMP 07/05/2021 (Approximate)   SpO2 100%   BMI 37.76 kg/m   Physical Exam Vitals reviewed.  Constitutional:      General: She is not in acute distress.    Appearance: She is obese. She is not ill-appearing.  HENT:     Head: Normocephalic.     Mouth/Throat:     Mouth: Mucous membranes are moist.     Pharynx: Oropharynx is clear.  Eyes:     Pupils: Pupils are equal, round, and reactive to light.  Cardiovascular:     Rate and Rhythm: Normal rate and regular rhythm.     Heart sounds: Normal heart sounds.  Pulmonary:     Effort: Pulmonary effort is normal.  Chest:     Chest wall: No tenderness.  Abdominal:     General: Bowel sounds are normal. There is no distension. There are no signs of injury.     Palpations: Abdomen is soft.     Tenderness: There is generalized abdominal tenderness. There is no right CVA tenderness, left CVA tenderness or rebound. Negative signs include Rovsing's sign and McBurney's sign.     Comments: Diffuse abdominal tenderness worse in RLQ  Skin:    General: Skin is warm and dry.     Capillary Refill: Capillary refill takes less than 2 seconds.  Neurological:     General: No focal deficit present.     Mental Status: She is alert.  Psychiatric:        Mood and Affect: Affect is tearful.      ED Treatments / Results  Labs (all labs ordered are listed, but only abnormal results are displayed) Labs Reviewed  COMPREHENSIVE METABOLIC PANEL - Abnormal; Notable for the following components:      Result Value   Glucose, Bld 136 (*)    All other components within normal limits  CBC WITH DIFFERENTIAL/PLATELET  LIPASE, BLOOD  URINALYSIS, ROUTINE W REFLEX MICROSCOPIC  I-STAT BETA HCG BLOOD, ED ([facility], [facility], [facility] ONLY)    EKG  Radiology CT Abdomen Pelvis W Contrast  Result Date: 07/19/2021 CLINICAL DATA:  Abdominal pain.  Severe EXAM: CT ABDOMEN AND PELVIS WITH CONTRAST TECHNIQUE: Multidetector CT imaging of the abdomen and pelvis was performed using the standard protocol following bolus administration of intravenous contrast. CONTRAST:  24mL OMNIPAQUE IOHEXOL 350 MG/ML SOLN COMPARISON:  CT Abdomen Pelvis, 07/08/2014 and NM HIDA, 07/14/2020. Abdominal ultrasound, 07/14/2020. FINDINGS: Lower chest: No acute abnormality. Hepatobiliary: No focal liver abnormality is seen. Status post cholecystectomy. No biliary dilatation. Pancreas: Unremarkable. No pancreatic ductal dilatation or surrounding inflammatory changes. Spleen: Normal in size without focal abnormality. Adrenals/Urinary Tract: 1.4 cm left adrenal myelolipoma, additional subcentimeter fat-containing lesion is too small to adequately characterize but likely an additional myelolipoma. The right adrenal gland is unremarkable. Kidneys are normal, without renal calculi, focal lesion, or hydronephrosis. Bladder is unremarkable. Stomach/Bowel: Stomach is within normal limits. Mild air-and fluid distension of small bowel, no bowel wall thickening. Appendix appears normal. Nondilated colon Vascular/Lymphatic: No significant vascular findings are present. No enlarged abdominal or pelvic lymph nodes. Reproductive: Uterus and bilateral adnexa are unremarkable. Other: Small fat-containing umbilical hernia. Trace pelvic ascites, physiologic in a female  of child-bearing age. Musculoskeletal: No acute or significant osseous findings. IMPRESSION: 1. Mild fluid distension of small bowel, within normal limits though may be seen in enteritis. 2. Interval cholecystectomy. 3. Incidental, small left adrenal myelolipoma without additional follow-up indicated. Electronically Signed   By: Wille Glaser  [place] M.D.   On: 07/19/2021 10:32    Procedures Procedures (including critical care time)  Medications Ordered in ED Medications  morphine 2 MG/ML injection 2 mg (has no administration in time range)  promethazine (PHENERGAN) 25 mg in sodium chloride 0.9 % 50 mL IVPB (has no administration in time range)  ondansetron (ZOFRAN-ODT) disintegrating tablet 4 mg (4 mg Oral Given 07/19/21 0753)  iohexol (OMNIPAQUE) 350 MG/ML injection 80 mL (80 mLs Intravenous Contrast Given 07/19/21 1013)     Initial Impression / Assessment and Plan / ED Course  I have reviewed the triage vital signs and the nursing notes.  Pertinent labs & imaging results that were available during my care of the patient were reviewed by me and considered in my medical decision making (see chart for details).       Final Clinical Impressions(s) / ED Diagnoses   Final diagnoses:  None    New Prescriptions New Prescriptions   No medications on file

## 2021-07-19 NOTE — ED Provider Notes (Signed)
Emergency Medicine Provider Triage Evaluation Note  Krista Adams , a 35 y.o. female  was evaluated in triage.  Pt complains of lower/generalized abdominal pain, described as soreness, worse with trying to sit upright or walk, associated with nausea, vomiting, loose stools. Prior cholecystectomy.   Review of Systems  Positive: Abdominal pain, n/v/d Negative: fevers  Physical Exam  There were no vitals taken for this visit. Gen:   Awake, no distress   Resp:  Normal effort  MSK:   Moves extremities without difficulty  Other:    Medical Decision Making  Medically screening exam initiated at 7:43 AM.  Appropriate orders placed.  AZLYN PUGH was informed that the remainder of the evaluation will be completed by another provider, this initial triage assessment does not replace that evaluation, and the importance of remaining in the ED until their evaluation is complete.     Tacy Learn, PA-C 07/19/21 [id number]    Wyvonnia Dusky, MD 07/19/21 [id number]

## 2021-07-25 ENCOUNTER — Other Ambulatory Visit: Payer: Self-pay | Admitting: Nurse Practitioner

## 2021-07-25 ENCOUNTER — Ambulatory Visit: Payer: BC Managed Care – PPO | Admitting: Nurse Practitioner

## 2021-07-25 ENCOUNTER — Encounter: Payer: Self-pay | Admitting: Nurse Practitioner

## 2021-07-25 ENCOUNTER — Other Ambulatory Visit: Payer: Self-pay

## 2021-07-25 VITALS — BP 122/80 | Temp 97.7°F | Ht 64.6 in | Wt 221.0 lb

## 2021-07-25 DIAGNOSIS — Z111 Encounter for screening for respiratory tuberculosis: Secondary | ICD-10-CM

## 2021-07-25 DIAGNOSIS — Z794 Long term (current) use of insulin: Secondary | ICD-10-CM | POA: Diagnosis not present

## 2021-07-25 DIAGNOSIS — N920 Excessive and frequent menstruation with regular cycle: Secondary | ICD-10-CM

## 2021-07-25 DIAGNOSIS — L819 Disorder of pigmentation, unspecified: Secondary | ICD-10-CM

## 2021-07-25 DIAGNOSIS — I10 Essential (primary) hypertension: Secondary | ICD-10-CM

## 2021-07-25 DIAGNOSIS — F418 Other specified anxiety disorders: Secondary | ICD-10-CM

## 2021-07-25 DIAGNOSIS — Z6837 Body mass index (BMI) 37.0-37.9, adult: Secondary | ICD-10-CM

## 2021-07-25 DIAGNOSIS — N922 Excessive menstruation at puberty: Secondary | ICD-10-CM

## 2021-07-25 DIAGNOSIS — E1165 Type 2 diabetes mellitus with hyperglycemia: Secondary | ICD-10-CM

## 2021-07-25 DIAGNOSIS — R21 Rash and other nonspecific skin eruption: Secondary | ICD-10-CM

## 2021-07-25 DIAGNOSIS — E6609 Other obesity due to excess calories: Secondary | ICD-10-CM

## 2021-07-25 DIAGNOSIS — Z113 Encounter for screening for infections with a predominantly sexual mode of transmission: Secondary | ICD-10-CM

## 2021-07-25 MED ORDER — CITALOPRAM HYDROBROMIDE 20 MG PO TABS: 20.0000 mg | ORAL_TABLET | Freq: Every day | ORAL | 1 refills | Status: DC

## 2021-07-25 MED ORDER — NAPROXEN 500 MG PO TABS: 500.0000 mg | ORAL_TABLET | Freq: Two times a day (BID) | ORAL | 2 refills | Status: DC | PRN

## 2021-07-25 NOTE — Progress Notes (Signed)
I,Yamilka Roman [employer] as a Education administrator for [employer], FNP.,have documented all relevant documentation on the behalf of [name], FNP,as directed by  [name], FNP while in the presence of [name], [place].   This visit occurred during the SARS-CoV-2 public health emergency.  Safety protocols were in place, including screening questions prior to the visit, additional usage of staff PPE, and extensive cleaning of exam room while observing appropriate contact time as indicated for disinfecting solutions.  Subjective:     Patient ID: Krista Adams , female    DOB: [birth year] , 35 y.o.   MRN: [id number]   Chief Complaint  Patient presents with   Diabetes    HPI  Pt here today for f/u on blood pressure and diabetes she is compliant with all meds.   Wt Readings from Last 3 Encounters: 07/25/21 : 221 lb (100.2 kg) 07/19/21 : 220 lb (99.8 kg) 05/25/21 : 222 lb 3.2 oz (100.8 kg)    Diabetes She presents for her follow-up diabetic visit. She has type 2 diabetes mellitus. Her disease course has been improving. Pertinent negatives for hypoglycemia include no dizziness, headaches or nervousness/anxiousness. Pertinent negatives for diabetes include no chest pain, no fatigue, no polydipsia, no polyphagia and no polyuria. There are no hypoglycemic complications. There are no diabetic complications. Risk factors for coronary artery disease include diabetes mellitus, obesity and sedentary lifestyle. Current diabetic treatment includes oral agent (dual therapy). She is compliant with treatment all of the time. She is following a generally unhealthy diet. When asked about meal planning, she reported none. She has not had a previous visit with a dietitian. She rarely participates in exercise. There is no change in her home blood glucose trend. (Blood sugar last week was 108-120.  ) An ACE inhibitor/angiotensin II receptor blocker is being taken. She does not see a podiatrist.Eye exam is not  current.  Hypertension This is a chronic problem. The current episode started more than 1 year ago. The problem has been gradually improving since onset. Pertinent negatives include no chest pain, headaches or palpitations. Risk factors for coronary artery disease include obesity and sedentary lifestyle.    Past Medical History:  Diagnosis Date   Anovulatory (dysfunctional uterine) bleeding 03/13/2012   Anxiety    Depression    Depression with anxiety 03/18/2012   Diabetes mellitus without complication (HCC)    DM (diabetes mellitus), type 2, uncontrolled ([place]) 05/06/2008   Qualifier: Diagnosis of  By: Drue Flirt  MD, Taineisha     Essential hypertension, benign 05/06/2008   Qualifier: Diagnosis of  By: Jamal Collin MD, Nathan     Morbid obesity ([place]) 05/06/2008   Qualifier: Diagnosis of  By: Jamal Collin MD, Ovid Curd       Family History  Problem Relation Age of Onset   Diabetes Mother    Hypertension Mother    Diabetes Maternal Aunt    Diabetes Maternal Grandmother    Hypertension Father      Current Outpatient Medications:    blood glucose meter kit and supplies KIT, Dispense based on patient and insurance preference. Use up to four times daily as directed. (FOR ICD-9 250.00, 250.01)., Disp: 1 each, Rfl: 0   ferrous sulfate 325 (65 FE) MG tablet, TAKE 1 TABLET BY MOUTH EVERY DAY, Disp: 90 tablet, Rfl: 1   glucose blood (ACCU-CHEK AVIVA PLUS) test strip, Use to check blood sugars twice daily E11.69, Disp: 100 each, Rfl: 2   labetalol (NORMODYNE) 100 MG tablet, Take 1 tablet (100 mg total)  by mouth daily., Disp: 90 tablet, Rfl: 1   Magnesium 200 MG TABS, Take 1 tablet by mouth daily with evening meal, Disp: 90 tablet, Rfl: 1   naproxen (NAPROSYN) 500 MG tablet, Take 1 tablet (500 mg total) by mouth 2 (two) times daily as needed., Disp: 60 tablet, Rfl: 2   ondansetron (ZOFRAN) 4 MG tablet, TAKE 1 TABLET BY MOUTH EVERY 8 HOURS AS NEEDED FOR NAUSEA AND VOMITING, Disp: 30 tablet, Rfl: 1   promethazine  (PHENERGAN) 25 MG tablet, Take 1 tablet (25 mg total) by mouth every 6 (six) hours as needed for nausea or vomiting., Disp: 30 tablet, Rfl: 0   Semaglutide (RYBELSUS) 14 MG TABS, Take 1 tablet by mouth daily. Take 30 minutes before eating breakfast, Disp: 90 tablet, Rfl: 1   citalopram (CELEXA) 20 MG tablet, Take 1 tablet (20 mg total) by mouth daily., Disp: 90 tablet, Rfl: 1   Allergies  Allergen Reactions   Pineapple     itching     Review of Systems  Constitutional:  Negative for fatigue.  Respiratory: Negative.    Cardiovascular:  Negative for chest pain, palpitations and leg swelling.  Endocrine: Negative for polydipsia, polyphagia and polyuria.  Musculoskeletal:  Positive for back pain.  Skin:  Positive for rash (discoloration to plantar surface of feet bilaterally).  Neurological:  Negative for dizziness and headaches.  Psychiatric/Behavioral: Negative.  The patient is not nervous/anxious.     Today's Vitals   07/25/21 1507  BP: 122/80  Temp: 97.7 F (36.5 C)  Weight: 221 lb (100.2 kg)  Height: 5' 4.6" (1.641 m)  PainSc: 7   PainLoc: Back   Body mass index is 37.23 kg/m.   Objective:  Physical Exam Vitals reviewed.  Constitutional:      General: She is not in acute distress.    Appearance: Normal appearance.  Cardiovascular:     Rate and Rhythm: Normal rate and regular rhythm.     Pulses: Normal pulses.     Heart sounds: No murmur heard. Pulmonary:     Effort: Pulmonary effort is normal. No respiratory distress.     Breath sounds: Normal breath sounds. No wheezing.  Skin:    General: Skin is warm.     Findings: Rash (hyperpigmented skin to plantar surface of feet bilateral) present.  Neurological:     General: No focal deficit present.     Mental Status: She is alert and oriented to person, place, and time.     Cranial Nerves: No cranial nerve deficit.     Motor: No weakness.  Psychiatric:        Mood and Affect: Mood normal.        Behavior: Behavior  normal.        Thought Content: Thought content normal.        Judgment: Judgment normal.        Assessment And Plan:     1. Type 2 diabetes mellitus with hyperglycemia, with long-term current use of insulin (HCC) Comments: will check labs at next visit, she is doing well and HgbA1c is within normal range Diabetic foot exam done, normal findings - POCT UA - Microalbumin  2. Essential hypertension, benign Comments: Good control Continue current medications - POCT Urinalysis Dipstick (81002)  3. Menorrhagia with regular cycle - naproxen (NAPROSYN) 500 MG tablet; Take 1 tablet (500 mg total) by mouth 2 (two) times daily as needed.  Dispense: 60 tablet; Refill: 2  4. Depression with anxiety Comments: Doing well on  citalopram - citalopram (CELEXA) 20 MG tablet; Take 1 tablet (20 mg total) by mouth daily.  Dispense: 90 tablet; Refill: 1  5. Rash and nonspecific skin eruption Comments: hyperpigmented skin to plantar surface of bilateral feet Will check t-palladium  6. Class 2 obesity due to excess calories with body mass index (BMI) of 37.0 to 37.9 in adult, unspecified whether serious comorbidity present  7. Screening-pulmonary TB Comments: Need for employment - QuantiFERON-TB Gold Plus  8. Screening for STD (sexually transmitted disease) - T pallidum Screening [place] - T.pallidum Ab, Total    Patient was given opportunity to ask questions. Patient verbalized understanding of the plan and was able to repeat key elements of the plan. All questions were answered to their satisfaction.  [name], FNP   I, [name], FNP, have reviewed all documentation for this visit. The documentation on 07/31/21 for the exam, diagnosis, procedures, and orders are all accurate and complete.   IF YOU HAVE BEEN REFERRED TO A SPECIALIST, IT MAY TAKE 1-2 WEEKS TO SCHEDULE/PROCESS THE REFERRAL. IF YOU HAVE NOT HEARD FROM US/SPECIALIST IN TWO WEEKS, PLEASE GIVE [place] A CALL AT [phone].    THE PATIENT IS ENCOURAGED TO PRACTICE SOCIAL DISTANCING DUE TO THE COVID-19 PANDEMIC.

## 2021-07-25 NOTE — Patient Instructions (Signed)
Diabetes Mellitus and Nutrition, Adult When you have diabetes, or diabetes mellitus, it is very important to have healthy eating habits because your blood sugar (glucose) levels are greatly affected by what you eat and drink. Eating healthy foods in the right amounts, at about the same times every day, can help you:  Control your blood glucose.  Lower your risk of heart disease.  Improve your blood pressure.  Reach or maintain a healthy weight. What can affect my meal plan? Every person with diabetes is different, and each person has different needs for a meal plan. Your health care provider may recommend that you work with a dietitian to make a meal plan that is best for you. Your meal plan may vary depending on factors such as:  The calories you need.  The medicines you take.  Your weight.  Your blood glucose, blood pressure, and cholesterol levels.  Your activity level.  Other health conditions you have, such as heart or kidney disease. How do carbohydrates affect me? Carbohydrates, also called carbs, affect your blood glucose level more than any other type of food. Eating carbs naturally raises the amount of glucose in your blood. Carb counting is a method for keeping track of how many carbs you eat. Counting carbs is important to keep your blood glucose at a healthy level, especially if you use insulin or take certain oral diabetes medicines. It is important to know how many carbs you can safely have in each meal. This is different for every person. Your dietitian can help you calculate how many carbs you should have at each meal and for each snack. How does alcohol affect me? Alcohol can cause a sudden decrease in blood glucose (hypoglycemia), especially if you use insulin or take certain oral diabetes medicines. Hypoglycemia can be a life-threatening condition. Symptoms of hypoglycemia, such as sleepiness, dizziness, and confusion, are similar to symptoms of having too much  alcohol.  Do not drink alcohol if: ? Your health care provider tells you not to drink. ? You are pregnant, may be pregnant, or are planning to become pregnant.  If you drink alcohol: ? Do not drink on an empty stomach. ? Limit how much you use to:  0-1 drink a day for women.  0-2 drinks a day for men. ? Be aware of how much alcohol is in your drink. In the [place] one drink equals one 12 oz bottle of beer (355 mL), one 5 oz glass of wine (148 mL), or one 1 oz glass of hard liquor (44 mL). ? Keep yourself hydrated with water, diet soda, or unsweetened iced tea.  Keep in mind that regular soda, juice, and other mixers may contain a lot of sugar and must be counted as carbs. What are tips for following this plan? Reading food labels  Start by checking the serving size on the "Nutrition Facts" label of packaged foods and drinks. The amount of calories, carbs, fats, and other nutrients listed on the label is based on one serving of the item. Many items contain more than one serving per package.  Check the total grams (g) of carbs in one serving. You can calculate the number of servings of carbs in one serving by dividing the total carbs by 15. For example, if a food has 30 g of total carbs per serving, it would be equal to 2 servings of carbs.  Check the number of grams (g) of saturated fats and trans fats in one serving. Choose foods that have   a low amount or none of these fats.  Check the number of milligrams (mg) of salt (sodium) in one serving. Most people should limit total sodium intake to less than 2,300 mg per day.  Always check the nutrition information of foods labeled as "low-fat" or "nonfat." These foods may be higher in added sugar or refined carbs and should be avoided.  Talk to your dietitian to identify your daily goals for nutrients listed on the label. Shopping  Avoid buying canned, pre-made, or processed foods. These foods tend to be high in fat, sodium, and added  sugar.  Shop around the outside edge of the grocery store. This is where you will most often find fresh fruits and vegetables, bulk grains, fresh meats, and fresh dairy. Cooking  Use low-heat cooking methods, such as baking, instead of high-heat cooking methods like deep frying.  Cook using healthy oils, such as olive, canola, or sunflower oil.  Avoid cooking with butter, cream, or high-fat meats. Meal planning  Eat meals and snacks regularly, preferably at the same times every day. Avoid going long periods of time without eating.  Eat foods that are high in fiber, such as fresh fruits, vegetables, beans, and whole grains. Talk with your dietitian about how many servings of carbs you can eat at each meal.  Eat 4-6 oz (112-168 g) of lean protein each day, such as lean meat, chicken, fish, eggs, or tofu. One ounce (oz) of lean protein is equal to: ? 1 oz (28 g) of meat, chicken, or fish. ? 1 egg. ?  cup (62 g) of tofu.  Eat some foods each day that contain healthy fats, such as avocado, nuts, seeds, and fish.   What foods should I eat? Fruits Berries. Apples. Oranges. Peaches. Apricots. Plums. Grapes. Mango. Papaya. Pomegranate. Kiwi. Cherries. Vegetables Lettuce. Spinach. Leafy greens, including kale, chard, collard greens, and mustard greens. Beets. Cauliflower. Cabbage. Broccoli. Carrots. Green beans. Tomatoes. Peppers. Onions. Cucumbers. Brussels sprouts. Grains Whole grains, such as whole-wheat or whole-grain bread, crackers, tortillas, cereal, and pasta. Unsweetened oatmeal. Quinoa. Brown or wild rice. Meats and other proteins Seafood. Poultry without skin. Lean cuts of poultry and beef. Tofu. Nuts. Seeds. Dairy Low-fat or fat-free dairy products such as milk, yogurt, and cheese. The items listed above may not be a complete list of foods and beverages you can eat. Contact a dietitian for more information. What foods should I avoid? Fruits Fruits canned with  syrup. Vegetables Canned vegetables. Frozen vegetables with butter or cream sauce. Grains Refined white flour and flour products such as bread, pasta, snack foods, and cereals. Avoid all processed foods. Meats and other proteins Fatty cuts of meat. Poultry with skin. Breaded or fried meats. Processed meat. Avoid saturated fats. Dairy Full-fat yogurt, cheese, or milk. Beverages Sweetened drinks, such as soda or iced tea. The items listed above may not be a complete list of foods and beverages you should avoid. Contact a dietitian for more information. Questions to ask a health care provider  Do I need to meet with a diabetes educator?  Do I need to meet with a dietitian?  What number can I call if I have questions?  When are the best times to check my blood glucose? Where to find more information:  American Diabetes Association: diabetes.org  Academy of Nutrition and Dietetics: www.eatright.org  National Institute of Diabetes and Digestive and Kidney Diseases: www.niddk.nih.gov  Association of Diabetes Care and Education Specialists: www.diabeteseducator.org Summary  It is important to have healthy eating   habits because your blood sugar (glucose) levels are greatly affected by what you eat and drink.  A healthy meal plan will help you control your blood glucose and maintain a healthy lifestyle.  Your health care provider may recommend that you work with a dietitian to make a meal plan that is best for you.  Keep in mind that carbohydrates (carbs) and alcohol have immediate effects on your blood glucose levels. It is important to count carbs and to use alcohol carefully. This information is not intended to replace advice given to you by your health care provider. Make sure you discuss any questions you have with your health care provider. Document Revised: 10/27/2019 Document Reviewed: 10/27/2019 Elsevier Patient Education  2021 Elsevier Inc.  

## 2021-07-26 ENCOUNTER — Encounter: Payer: Self-pay | Admitting: Nurse Practitioner

## 2021-07-26 LAB — POCT URINALYSIS DIPSTICK
Bilirubin, UA: NEGATIVE
Blood, UA: NEGATIVE
Glucose, UA: NEGATIVE
Ketones, UA: NEGATIVE
Leukocytes, UA: NEGATIVE
Nitrite, UA: NEGATIVE
Protein, UA: NEGATIVE
Spec Grav, UA: >=1.03 — AB (ref 1.010–1.025)
Urobilinogen, UA: 1 E.U./dL
pH, UA: 7 (ref 5.0–8.0)

## 2021-07-26 LAB — POCT UA - MICROALBUMIN
Albumin/Creatinine Ratio, Urine, POC: <30
Creatinine, POC: 300 mg/dL
Microalbumin Ur, POC: 30 mg/L

## 2021-07-27 LAB — T.PALLIDUM AB, TOTAL: T pallidum Antibodies (TP-PA): NONREACTIVE

## 2021-07-27 LAB — HOUSE ACCOUNT TRACKING

## 2021-07-27 LAB — QUANTIFERON-TB GOLD PLUS,4 TUBES,DRAW SITE INCUBATED

## 2021-07-31 ENCOUNTER — Encounter: Payer: Self-pay | Admitting: Nurse Practitioner

## 2021-08-08 ENCOUNTER — Encounter: Payer: Self-pay | Admitting: Nurse Practitioner

## 2021-08-16 ENCOUNTER — Encounter: Payer: Self-pay | Admitting: Nurse Practitioner

## 2021-08-22 ENCOUNTER — Ambulatory Visit: Payer: BC Managed Care – PPO

## 2021-08-22 ENCOUNTER — Other Ambulatory Visit: Payer: Self-pay

## 2021-08-22 VITALS — BP 132/78 | Temp 98.0°F | Ht 64.6 in | Wt 216.2 lb

## 2021-08-22 DIAGNOSIS — Z111 Encounter for screening for respiratory tuberculosis: Secondary | ICD-10-CM

## 2021-08-22 NOTE — Progress Notes (Signed)
Patient presents today for a TB skin test for work. YL,RMA

## 2021-08-24 ENCOUNTER — Encounter: Payer: Self-pay | Admitting: Nurse Practitioner

## 2021-08-24 ENCOUNTER — Ambulatory Visit (INDEPENDENT_AMBULATORY_CARE_PROVIDER_SITE_OTHER): Payer: BC Managed Care – PPO

## 2021-08-24 ENCOUNTER — Other Ambulatory Visit: Payer: Self-pay

## 2021-08-24 VITALS — BP 122/80 | Temp 98.5°F | Ht 64.6 in | Wt 214.0 lb

## 2021-08-24 DIAGNOSIS — Z111 Encounter for screening for respiratory tuberculosis: Secondary | ICD-10-CM

## 2021-08-24 LAB — TB SKIN TEST
Induration: 0 mm
TB Skin Test: NEGATIVE

## 2021-08-24 NOTE — Progress Notes (Signed)
Patient presents today to have her tb skin test read. YL,RMA

## 2021-08-30 ENCOUNTER — Other Ambulatory Visit: Payer: Self-pay | Admitting: Nurse Practitioner

## 2021-09-08 ENCOUNTER — Other Ambulatory Visit: Payer: Self-pay | Admitting: Nurse Practitioner

## 2021-09-08 DIAGNOSIS — F418 Other specified anxiety disorders: Secondary | ICD-10-CM

## 2021-09-26 ENCOUNTER — Other Ambulatory Visit (HOSPITAL_COMMUNITY)
Admission: RE | Admit: 2021-09-26 | Discharge: 2021-09-26 | Disposition: A | Discharge status: Home / Self Care | Payer: BC Managed Care – PPO | Source: Ambulatory Visit | Attending: Nurse Practitioner | Admitting: Nurse Practitioner

## 2021-09-26 ENCOUNTER — Other Ambulatory Visit: Payer: Self-pay

## 2021-09-26 ENCOUNTER — Encounter: Payer: Self-pay | Admitting: Nurse Practitioner

## 2021-09-26 ENCOUNTER — Ambulatory Visit (INDEPENDENT_AMBULATORY_CARE_PROVIDER_SITE_OTHER): Payer: BC Managed Care – PPO | Admitting: Nurse Practitioner

## 2021-09-26 VITALS — BP 140/80 | HR 80 | Temp 98.1°F | Ht 64.0 in | Wt 218.0 lb

## 2021-09-26 DIAGNOSIS — Z23 Encounter for immunization: Secondary | ICD-10-CM | POA: Diagnosis not present

## 2021-09-26 DIAGNOSIS — Z113 Encounter for screening for infections with a predominantly sexual mode of transmission: Secondary | ICD-10-CM | POA: Insufficient documentation

## 2021-09-26 DIAGNOSIS — Z124 Encounter for screening for malignant neoplasm of cervix: Secondary | ICD-10-CM

## 2021-09-26 DIAGNOSIS — E1169 Type 2 diabetes mellitus with other specified complication: Secondary | ICD-10-CM | POA: Diagnosis not present

## 2021-09-26 DIAGNOSIS — I10 Essential (primary) hypertension: Secondary | ICD-10-CM | POA: Diagnosis not present

## 2021-09-26 DIAGNOSIS — L659 Nonscarring hair loss, unspecified: Secondary | ICD-10-CM

## 2021-09-26 DIAGNOSIS — Z6837 Body mass index (BMI) 37.0-37.9, adult: Secondary | ICD-10-CM

## 2021-09-26 DIAGNOSIS — Z0001 Encounter for general adult medical examination with abnormal findings: Secondary | ICD-10-CM | POA: Diagnosis not present

## 2021-09-26 DIAGNOSIS — Z720 Tobacco use: Secondary | ICD-10-CM

## 2021-09-26 DIAGNOSIS — E119 Type 2 diabetes mellitus without complications: Secondary | ICD-10-CM

## 2021-09-26 DIAGNOSIS — Z0000 Encounter for general adult medical examination without abnormal findings: Secondary | ICD-10-CM

## 2021-09-26 LAB — POCT URINALYSIS DIPSTICK
Bilirubin, UA: NEGATIVE
Glucose, UA: NEGATIVE
Leukocytes, UA: NEGATIVE
Nitrite, UA: NEGATIVE
Protein, UA: NEGATIVE
Spec Grav, UA: 1.02 (ref 1.010–1.025)
Urobilinogen, UA: 0.2 E.U./dL
pH, UA: 6 (ref 5.0–8.0)

## 2021-09-26 LAB — POCT UA - MICROALBUMIN
Albumin/Creatinine Ratio, Urine, POC: <30
Creatinine, POC: 300 mg/dL
Microalbumin Ur, POC: 30 mg/L

## 2021-09-26 MED ORDER — TETANUS-DIPHTH-ACELL PERTUSSIS 5-2.5-18.5 LF-MCG/0.5 IM SUSY
0.5000 mL | PREFILLED_SYRINGE | Freq: Once | INTRAMUSCULAR | Status: AC
  Administered 2021-09-26: 0.5 mL via INTRAMUSCULAR

## 2021-09-26 NOTE — Patient Instructions (Signed)
Health Maintenance, Female Adopting a healthy lifestyle and getting preventive care are important in promoting health and wellness. Ask your health care provider about: The right schedule for you to have regular tests and exams. Things you can do on your own to prevent diseases and keep yourself healthy. What should I know about diet, weight, and exercise? Eat a healthy diet  Eat a diet that includes plenty of vegetables, fruits, low-fat dairy products, and lean protein. Do not eat a lot of foods that are high in solid fats, added sugars, or sodium. Maintain a healthy weight Body mass index (BMI) is used to identify weight problems. It estimates body fat based on height and weight. Your health care provider can help determine your BMI and help you achieve or maintain a healthy weight. Get regular exercise Get regular exercise. This is one of the most important things you can do for your health. Most adults should: Exercise for at least 150 minutes each week. The exercise should increase your heart rate and make you sweat (moderate-intensity exercise). Do strengthening exercises at least twice a week. This is in addition to the moderate-intensity exercise. Spend less time sitting. Even light physical activity can be beneficial. Watch cholesterol and blood lipids Have your blood tested for lipids and cholesterol at 37 years of age, then have this test every 5 years. Have your cholesterol levels checked more often if: Your lipid or cholesterol levels are high. You are older than 37 years of age. You are at high risk for heart disease. What should I know about cancer screening? Depending on your health history and family history, you may need to have cancer screening at various ages. This may include screening for: Breast cancer. Cervical cancer. Colorectal cancer. Skin cancer. Lung cancer. What should I know about heart disease, diabetes, and high blood pressure? Blood pressure and heart  disease High blood pressure causes heart disease and increases the risk of stroke. This is more likely to develop in people who have high blood pressure readings, are of African descent, or are overweight. Have your blood pressure checked: Every 3-5 years if you are 18-39 years of age. Every year if you are 40 years old or older. Diabetes Have regular diabetes screenings. This checks your fasting blood sugar level. Have the screening done: Once every three years after age 40 if you are at a normal weight and have a low risk for diabetes. More often and at a younger age if you are overweight or have a high risk for diabetes. What should I know about preventing infection? Hepatitis B If you have a higher risk for hepatitis B, you should be screened for this virus. Talk with your health care provider to find out if you are at risk for hepatitis B infection. Hepatitis C Testing is recommended for: Everyone born from 1945 through 1965. Anyone with known risk factors for hepatitis C. Sexually transmitted infections (STIs) Get screened for STIs, including gonorrhea and chlamydia, if: You are sexually active and are younger than 37 years of age. You are older than 37 years of age and your health care provider tells you that you are at risk for this type of infection. Your sexual activity has changed since you were last screened, and you are at increased risk for chlamydia or gonorrhea. Ask your health care provider if you are at risk. Ask your health care provider about whether you are at high risk for HIV. Your health care provider may recommend a prescription medicine   to help prevent HIV infection. If you choose to take medicine to prevent HIV, you should first get tested for HIV. You should then be tested every 3 months for as long as you are taking the medicine. Pregnancy If you are about to stop having your period (premenopausal) and you may become pregnant, seek counseling before you get  pregnant. Take 400 to 800 micrograms (mcg) of folic acid every day if you become pregnant. Ask for birth control (contraception) if you want to prevent pregnancy. Osteoporosis and menopause Osteoporosis is a disease in which the bones lose minerals and strength with aging. This can result in bone fractures. If you are 65 years old or older, or if you are at risk for osteoporosis and fractures, ask your health care provider if you should: Be screened for bone loss. Take a calcium or vitamin D supplement to lower your risk of fractures. Be given hormone replacement therapy (HRT) to treat symptoms of menopause. Follow these instructions at home: Lifestyle Do not use any products that contain nicotine or tobacco, such as cigarettes, e-cigarettes, and chewing tobacco. If you need help quitting, ask your health care provider. Do not use street drugs. Do not share needles. Ask your health care provider for help if you need support or information about quitting drugs. Alcohol use Do not drink alcohol if: Your health care provider tells you not to drink. You are pregnant, may be pregnant, or are planning to become pregnant. If you drink alcohol: Limit how much you use to 0-1 drink a day. Limit intake if you are breastfeeding. Be aware of how much alcohol is in your drink. In the [place] one drink equals one 12 oz bottle of beer (355 mL), one 5 oz glass of wine (148 mL), or one 1 oz glass of hard liquor (44 mL). General instructions Schedule regular health, dental, and eye exams. Stay current with your vaccines. Tell your health care provider if: You often feel depressed. You have ever been abused or do not feel safe at home. Summary Adopting a healthy lifestyle and getting preventive care are important in promoting health and wellness. Follow your health care provider's instructions about healthy diet, exercising, and getting tested or screened for diseases. Follow your health care provider's  instructions on monitoring your cholesterol and blood pressure. This information is not intended to replace advice given to you by your health care provider. Make sure you discuss any questions you have with your health care provider. Document Revised: 01/27/2021 Document Reviewed: 11/12/2018 Elsevier Patient Education  2022 Elsevier Inc.  

## 2021-09-26 NOTE — Progress Notes (Signed)
I, [place] Hamilton,acting as a Education administrator for [employer], FNP.,have documented all relevant documentation on the behalf of Krista Brine, FNP,as directed by  Krista Brine, FNP while in the presence of Krista Adams, [place].  This visit occurred during the SARS-CoV-2 public health emergency.  Safety protocols were in place, including screening questions prior to the visit, additional usage of staff PPE, and extensive cleaning of exam room while observing appropriate contact time as indicated for disinfecting solutions.  Subjective:     Patient ID: Krista Adams , female    DOB: 04/08/1986 , 35 y.o.   MRN: [id number]   Chief Complaint  Patient presents with   Annual Exam     HPI  Here for HM.   Wt Readings from Last 3 Encounters: 09/26/21 : 218 lb (98.9 kg) 08/24/21 : 214 lb (97.1 kg) 08/22/21 : 216 lb 3.2 oz (98.1 kg)    Diabetes She presents for her follow-up diabetic visit. She has type 2 diabetes mellitus. Her disease course has been improving. Pertinent negatives for hypoglycemia include no dizziness, headaches or nervousness/anxiousness. Pertinent negatives for diabetes include no chest pain, no fatigue, no polydipsia, no polyphagia and no polyuria. There are no hypoglycemic complications. There are no diabetic complications. Risk factors for coronary artery disease include diabetes mellitus, obesity and sedentary lifestyle. Current diabetic treatment includes oral agent (dual therapy). She is compliant with treatment all of the time. She is following a generally unhealthy diet. When asked about meal planning, she reported none. She has not had a previous visit with a dietitian. She rarely participates in exercise. There is no change in her home blood glucose trend. (She has not checked her blood sugar recently) An ACE inhibitor/angiotensin II receptor blocker is being taken. She does not see a podiatrist.Eye exam is not current.  Hypertension This is a chronic problem. The current  episode started more than 1 year ago. The problem has been gradually improving since onset. Pertinent negatives include no chest pain, headaches or palpitations. Risk factors for coronary artery disease include obesity and sedentary lifestyle.    Past Medical History:  Diagnosis Date   Anovulatory (dysfunctional uterine) bleeding 03/13/2012   Anxiety    Depression    Depression with anxiety 03/18/2012   Diabetes mellitus without complication (HCC)    DM (diabetes mellitus), type 2, uncontrolled 05/06/2008   Qualifier: Diagnosis of  By: Drue Flirt  MD, Taineisha     Essential hypertension, benign 05/06/2008   Qualifier: Diagnosis of  By: Jamal Collin MD, Nathan     Morbid obesity ([place]) 05/06/2008   Qualifier: Diagnosis of  By: Jamal Collin MD, Ovid Curd       Family History  Problem Relation Age of Onset   Diabetes Mother    Hypertension Mother    Diabetes Maternal Aunt    Diabetes Maternal Grandmother    Hypertension Father      Current Outpatient Medications:    blood glucose meter kit and supplies KIT, Dispense based on patient and insurance preference. Use up to four times daily as directed. (FOR ICD-9 250.00, 250.01)., Disp: 1 each, Rfl: 0   citalopram (CELEXA) 20 MG tablet, Take 1 tablet (20 mg total) by mouth daily., Disp: 90 tablet, Rfl: 1   ferrous sulfate 325 (65 FE) MG tablet, TAKE 1 TABLET BY MOUTH EVERY DAY, Disp: 90 tablet, Rfl: 1   glucose blood (ACCU-CHEK AVIVA PLUS) test strip, Use to check blood sugars twice daily E11.69, Disp: 100 each, Rfl: 2   labetalol (NORMODYNE) 100  MG tablet, Take 1 tablet (100 mg total) by mouth daily., Disp: 90 tablet, Rfl: 1   Magnesium 200 MG TABS, Take 1 tablet by mouth daily with evening meal, Disp: 90 tablet, Rfl: 1   naproxen (NAPROSYN) 500 MG tablet, Take 1 tablet (500 mg total) by mouth 2 (two) times daily as needed., Disp: 60 tablet, Rfl: 2   ondansetron (ZOFRAN) 4 MG tablet, TAKE 1 TABLET BY MOUTH EVERY 8 HOURS AS NEEDED FOR NAUSEA AND VOMITING, Disp: 30  tablet, Rfl: 1   promethazine (PHENERGAN) 25 MG tablet, Take 1 tablet (25 mg total) by mouth every 6 (six) hours as needed for nausea or vomiting., Disp: 30 tablet, Rfl: 0   Semaglutide (RYBELSUS) 14 MG TABS, Take 1 tablet by mouth daily. Take 30 minutes before eating breakfast, Disp: 90 tablet, Rfl: 1   esomeprazole (NEXIUM) 40 MG capsule, Take 1 capsule (40 mg total) by mouth daily., Disp: 30 capsule, Rfl: 1   Allergies  Allergen Reactions   Pineapple     itching      The patient states she uses none for birth control. Last LMP was Patient's last menstrual period was 09/09/2021.. Negative for Dysmenorrhea and Negative for Menorrhagia. Negative for: breast discharge, breast lump(s), breast pain and breast self exam. Associated symptoms include abnormal vaginal bleeding. Pertinent negatives include abnormal bleeding (hematology), anxiety, decreased libido, depression, difficulty falling sleep, dyspareunia, history of infertility, nocturia, sexual dysfunction, sleep disturbances, urinary incontinence, urinary urgency, vaginal discharge and vaginal itching. Diet regular; decreased appetite. The patient states her exercise level is minimal - walking 2 days a week.   The patient's tobacco use is:  Social History   Tobacco Use  Smoking Status Every Day   Packs/day: 1.00   Types: Cigars, Cigarettes  Smokeless Tobacco Never  Tobacco Comments   smokes black and mild ; reports not smoking as much   She has been exposed to passive smoke. The patient's alcohol use is:  Social History   Substance and Sexual Activity  Alcohol Use No   Additional information: Last pap 2005, next one scheduled for today.    Review of Systems  Constitutional: Negative.  Negative for fatigue.  HENT: Negative.    Eyes: Negative.   Respiratory: Negative.    Cardiovascular: Negative.  Negative for chest pain and palpitations.  Gastrointestinal: Negative.   Endocrine: Negative.  Negative for polydipsia, polyphagia  and polyuria.  Genitourinary: Negative.   Musculoskeletal: Negative.   Skin: Negative.   Allergic/Immunologic: Negative.   Neurological: Negative.  Negative for dizziness and headaches.  Hematological: Negative.   Psychiatric/Behavioral: Negative.  The patient is not nervous/anxious.     Today's Vitals   09/26/21 1420  BP: 140/80  Pulse: 80  Temp: 98.1 F (36.7 C)  Weight: 218 lb (98.9 kg)  Height: $Remove'5\' 4"'kzCaWSd$  (1.626 m)   Body mass index is 37.42 kg/m.   Objective:  Physical Exam Constitutional:      General: She is not in acute distress.    Appearance: Normal appearance. She is well-developed. She is obese.  HENT:     Head: Normocephalic and atraumatic.     Right Ear: Hearing, tympanic membrane, ear canal and external ear normal. There is no impacted cerumen.     Left Ear: Hearing, tympanic membrane, ear canal and external ear normal. There is no impacted cerumen.     Nose:     Comments: Deferred - masked    Mouth/Throat:     Comments: Deferred - masked Eyes:  General: Lids are normal.     Extraocular Movements: Extraocular movements intact.     Conjunctiva/sclera: Conjunctivae normal.     Pupils: Pupils are equal, round, and reactive to light.     Funduscopic exam:    Right eye: No papilledema.        Left eye: No papilledema.  Neck:     Thyroid: No thyroid mass.     Vascular: No carotid bruit.  Cardiovascular:     Rate and Rhythm: Normal rate and regular rhythm.     Pulses: Normal pulses.     Heart sounds: Normal heart sounds. No murmur heard. Pulmonary:     Effort: Pulmonary effort is normal. No respiratory distress.     Breath sounds: Normal breath sounds. No wheezing.  Chest:     Chest wall: No mass.  Breasts:    Tanner Score is 5.     Right: Normal. No mass or tenderness.     Left: Normal. No mass or tenderness.  Abdominal:     General: Abdomen is flat. Bowel sounds are normal. There is no distension.     Palpations: Abdomen is soft.     Tenderness:  There is no abdominal tenderness.     Hernia: There is no hernia in the left inguinal area or right inguinal area.  Genitourinary:    General: Normal vulva.     Exam position: Lithotomy position.     Pubic Area: No rash.      Labia:        Right: No rash or tenderness.        Left: No rash or tenderness.      Vagina: Normal.     Cervix: Normal.     Uterus: Absent.      Adnexa: Right adnexa normal and left adnexa normal.     Rectum: Normal. Guaiac result negative.  Musculoskeletal:        General: No swelling or tenderness. Normal range of motion.     Cervical back: Full passive range of motion without pain, normal range of motion and neck supple.     Right lower leg: No edema.     Left lower leg: No edema.  Lymphadenopathy:     Upper Body:     Right upper body: No supraclavicular, axillary or pectoral adenopathy.     Left upper body: No supraclavicular, axillary or pectoral adenopathy.  Skin:    General: Skin is warm and dry.     Capillary Refill: Capillary refill takes less than 2 seconds.  Neurological:     General: No focal deficit present.     Mental Status: She is alert and oriented to person, place, and time.     Cranial Nerves: No cranial nerve deficit.     Sensory: No sensory deficit.  Psychiatric:        Mood and Affect: Mood normal.        Behavior: Behavior normal.        Thought Content: Thought content normal.        Judgment: Judgment normal.        Assessment And Plan:     1. Encounter for general adult medical examination w/o abnormal findings Behavior modifications discussed and diet history reviewed.   Pt will continue to exercise regularly and modify diet with low GI, plant based foods and decrease intake of processed foods.  Recommend intake of daily multivitamin, Vitamin D, and calcium.  Recommend mammogram for preventive screenings, as well as recommend  immunizations that include influenza, TDAP - Flu Vaccine QUAD 6+ mos PF IM (Fluarix Quad PF) -  POCT Urinalysis Dipstick (81002) - POCT UA - Microalbumin - CBC - Hemoglobin A1c - Lipid panel  2. Screening for STD (sexually transmitted disease) - Cervicovaginal ancillary only  3. Encounter for Papanicolaou smear of cervix No abnormal findings on physical exam - Cytology - PAP  4. Hair loss Comments: She has significant hair loss to the first half of her head, she is using colored gel. I have also encouraged to see a trichologist and will refer to derm - Ambulatory referral to Dermatology  5. Encounter for immunization Will give tetanus vaccine today while in office. Refer to order management. TDAP will be administered to adults 29-22 years old every 10 years. - Tdap (BOOSTRIX) injection 0.5 mL  6. Type 2 diabetes mellitus without complication, without long-term current use of insulin (HCC) Comments: Blood sugars are improving, tolerating medications well Will check HgbA1c.  I have advised again to go for diabetic eye exam - COMPLETE METABOLIC PANEL WITH GFR  7. Essential hypertension, benign Comments: Fair control of her blood pressure, limit salt intake and exercise regularly - COMPLETE METABOLIC PANEL WITH GFR  8. Morbid obesity ([place]) Chronic Discussed healthy diet and regular exercise options  Encouraged to exercise at least 150 minutes per week with 2 days of strength training    Patient was given opportunity to ask questions. Patient verbalized understanding of the plan and was able to repeat key elements of the plan. All questions were answered to their satisfaction.   Krista Brine, FNP   I, Krista Brine, FNP, have reviewed all documentation for this visit. The documentation on 09/25/21 for the exam, diagnosis, procedures, and orders are all accurate and complete.   THE PATIENT IS ENCOURAGED TO PRACTICE SOCIAL DISTANCING DUE TO THE COVID-19 PANDEMIC.

## 2021-09-27 LAB — COMPLETE METABOLIC PANEL WITH GFR
AG Ratio: 1.5 (calc) (ref 1.0–2.5)
ALT: 4 U/L — ABNORMAL LOW (ref 6–29)
AST: 10 U/L (ref 10–30)
Albumin: 3.7 g/dL (ref 3.6–5.1)
Alkaline phosphatase (APISO): 76 U/L (ref 31–125)
BUN: 11 mg/dL (ref 7–25)
CO2: 21 mmol/L (ref 20–32)
Calcium: 8.5 mg/dL — ABNORMAL LOW (ref 8.6–10.2)
Chloride: 107 mmol/L (ref 98–110)
Creat: 0.6 mg/dL (ref 0.50–0.97)
Globulin: 2.5 g/dL (calc) (ref 1.9–3.7)
Glucose, Bld: 125 mg/dL — ABNORMAL HIGH (ref 65–99)
Potassium: 3.9 mmol/L (ref 3.5–5.3)
Sodium: 137 mmol/L (ref 135–146)
Total Bilirubin: 0.4 mg/dL (ref 0.2–1.2)
Total Protein: 6.2 g/dL (ref 6.1–8.1)
eGFR: 118 mL/min/{1.73_m2} (ref >=60)

## 2021-09-27 LAB — HEMOGLOBIN A1C
Hgb A1c MFr Bld: 6 % of total Hgb — ABNORMAL HIGH (ref <5.7)
Mean Plasma Glucose: 126 mg/dL
eAG (mmol/L): 7 mmol/L

## 2021-09-27 LAB — CBC
HCT: 37 % (ref 35.0–45.0)
Hemoglobin: 11.9 g/dL (ref 11.7–15.5)
MCH: 29.4 pg (ref 27.0–33.0)
MCHC: 32.2 g/dL (ref 32.0–36.0)
MCV: 91.4 fL (ref 80.0–100.0)
MPV: 10.3 fL (ref 7.5–12.5)
Platelets: 245 10*3/uL (ref 140–400)
RBC: 4.05 10*6/uL (ref 3.80–5.10)
RDW: 12.8 % (ref 11.0–15.0)
WBC: 4.8 10*3/uL (ref 3.8–10.8)

## 2021-09-27 LAB — LIPID PANEL
Cholesterol: 117 mg/dL (ref <200)
HDL: 45 mg/dL — ABNORMAL LOW (ref >=50)
LDL Cholesterol (Calc): 57 mg/dL (calc)
Non-HDL Cholesterol (Calc): 72 mg/dL (calc) (ref <130)
Total CHOL/HDL Ratio: 2.6 (calc) (ref <5.0)
Triglycerides: 73 mg/dL (ref <150)

## 2021-09-28 ENCOUNTER — Other Ambulatory Visit: Payer: Self-pay | Admitting: Nurse Practitioner

## 2021-09-28 LAB — CERVICOVAGINAL ANCILLARY ONLY
Bacterial Vaginitis (gardnerella): POSITIVE — AB
Candida Glabrata: NEGATIVE
Candida Vaginitis: NEGATIVE
Chlamydia: NEGATIVE
Comment: NEGATIVE
Comment: NEGATIVE
Comment: NEGATIVE
Comment: NEGATIVE
Comment: NEGATIVE
Comment: NORMAL
Neisseria Gonorrhea: NEGATIVE
Trichomonas: POSITIVE — AB

## 2021-09-28 MED ORDER — METRONIDAZOLE 500 MG PO TABS: 500.0000 mg | ORAL_TABLET | Freq: Three times a day (TID) | ORAL | 0 refills | Status: AC

## 2021-10-02 ENCOUNTER — Encounter: Payer: Self-pay | Admitting: Nurse Practitioner

## 2021-10-02 ENCOUNTER — Telehealth: Payer: Self-pay

## 2021-10-02 ENCOUNTER — Other Ambulatory Visit: Payer: Self-pay

## 2021-10-02 LAB — CYTOLOGY - PAP
Comment: NEGATIVE
Diagnosis: UNDETERMINED — AB
High risk HPV: NEGATIVE

## 2021-10-02 MED ORDER — ESOMEPRAZOLE MAGNESIUM 40 MG PO CPDR: 40.0000 mg | DELAYED_RELEASE_CAPSULE | Freq: Every day | ORAL | 1 refills | Status: DC

## 2021-10-02 NOTE — Telephone Encounter (Signed)
Looked at rx benefit

## 2021-10-27 ENCOUNTER — Other Ambulatory Visit: Payer: Self-pay | Admitting: Nurse Practitioner

## 2021-10-31 ENCOUNTER — Ambulatory Visit: Payer: BC Managed Care – PPO | Admitting: Nurse Practitioner

## 2021-11-13 ENCOUNTER — Other Ambulatory Visit: Payer: Self-pay | Admitting: Nurse Practitioner

## 2021-12-13 ENCOUNTER — Other Ambulatory Visit: Payer: Self-pay | Admitting: Nurse Practitioner

## 2021-12-13 DIAGNOSIS — E1165 Type 2 diabetes mellitus with hyperglycemia: Secondary | ICD-10-CM

## 2021-12-27 ENCOUNTER — Other Ambulatory Visit: Payer: Self-pay | Admitting: Nurse Practitioner

## 2021-12-27 DIAGNOSIS — N920 Excessive and frequent menstruation with regular cycle: Secondary | ICD-10-CM

## 2022-01-11 ENCOUNTER — Inpatient Hospital Stay (HOSPITAL_COMMUNITY)
Admission: AD | Admit: 2022-01-11 | Discharge: 2022-01-11 | Disposition: A | Discharge status: Home / Self Care | Payer: BC Managed Care – PPO | Attending: Family Medicine | Admitting: Family Medicine

## 2022-01-11 ENCOUNTER — Inpatient Hospital Stay (HOSPITAL_COMMUNITY): Payer: BC Managed Care – PPO

## 2022-01-11 ENCOUNTER — Encounter (HOSPITAL_COMMUNITY): Payer: Self-pay | Admitting: Family Medicine

## 2022-01-11 ENCOUNTER — Other Ambulatory Visit: Payer: Self-pay

## 2022-01-11 DIAGNOSIS — O210 Mild hyperemesis gravidarum: Secondary | ICD-10-CM | POA: Diagnosis not present

## 2022-01-11 DIAGNOSIS — Z79899 Other long term (current) drug therapy: Secondary | ICD-10-CM | POA: Diagnosis not present

## 2022-01-11 DIAGNOSIS — Z87891 Personal history of nicotine dependence: Secondary | ICD-10-CM | POA: Diagnosis not present

## 2022-01-11 DIAGNOSIS — O26891 Other specified pregnancy related conditions, first trimester: Secondary | ICD-10-CM | POA: Insufficient documentation

## 2022-01-11 DIAGNOSIS — Z3A01 Less than 8 weeks gestation of pregnancy: Secondary | ICD-10-CM | POA: Diagnosis not present

## 2022-01-11 DIAGNOSIS — R110 Nausea: Secondary | ICD-10-CM

## 2022-01-11 DIAGNOSIS — Z3491 Encounter for supervision of normal pregnancy, unspecified, first trimester: Secondary | ICD-10-CM | POA: Diagnosis not present

## 2022-01-11 DIAGNOSIS — O219 Vomiting of pregnancy, unspecified: Secondary | ICD-10-CM

## 2022-01-11 DIAGNOSIS — R109 Unspecified abdominal pain: Secondary | ICD-10-CM | POA: Diagnosis not present

## 2022-01-11 DIAGNOSIS — O26899 Other specified pregnancy related conditions, unspecified trimester: Secondary | ICD-10-CM

## 2022-01-11 LAB — URINALYSIS, ROUTINE W REFLEX MICROSCOPIC
Bacteria, UA: NONE SEEN
Bilirubin Urine: NEGATIVE
Glucose, UA: NEGATIVE mg/dL
Hgb urine dipstick: NEGATIVE
Ketones, ur: NEGATIVE mg/dL
Nitrite: NEGATIVE
Protein, ur: NEGATIVE mg/dL
Specific Gravity, Urine: 1.014 (ref 1.005–1.030)
pH: 6 (ref 5.0–8.0)

## 2022-01-11 LAB — CBC
HCT: 39.2 % (ref 36.0–46.0)
Hemoglobin: 13.1 g/dL (ref 12.0–15.0)
MCH: 30.2 pg (ref 26.0–34.0)
MCHC: 33.4 g/dL (ref 30.0–36.0)
MCV: 90.3 fL (ref 80.0–100.0)
Platelets: 252 10*3/uL (ref 150–400)
RBC: 4.34 MIL/uL (ref 3.87–5.11)
RDW: 13.5 % (ref 11.5–15.5)
WBC: 3 10*3/uL — ABNORMAL LOW (ref 4.0–10.5)
nRBC: 0 % (ref 0.0–0.2)

## 2022-01-11 LAB — ABO/RH: ABO/RH(D): B POS

## 2022-01-11 LAB — HCG, QUANTITATIVE, PREGNANCY: hCG, Beta Chain, Quant, S: 62261 m[IU]/mL — ABNORMAL HIGH (ref <5)

## 2022-01-11 LAB — WET PREP, GENITAL
Sperm: NONE SEEN
Trich, Wet Prep: NONE SEEN
WBC, Wet Prep HPF POC: <10 (ref <10)
Yeast Wet Prep HPF POC: NONE SEEN

## 2022-01-11 LAB — POCT PREGNANCY, URINE: Preg Test, Ur: POSITIVE — AB

## 2022-01-11 MED ORDER — SCOPOLAMINE 1 MG/3DAYS TD PT72: 1.0000 | MEDICATED_PATCH | TRANSDERMAL | 12 refills | Status: DC

## 2022-01-11 MED ORDER — FAMOTIDINE IN NACL 20-0.9 MG/50ML-% IV SOLN
20.0000 mg | Freq: Once | INTRAVENOUS | Status: AC
  Administered 2022-01-11: 20 mg via INTRAVENOUS
  Filled 2022-01-11: qty 50

## 2022-01-11 MED ORDER — LACTATED RINGERS IV BOLUS
1000.0000 mL | Freq: Once | INTRAVENOUS | Status: AC
Start: 2022-01-11 — End: 2022-01-11
  Administered 2022-01-11: 1000 mL via INTRAVENOUS

## 2022-01-11 MED ORDER — ONDANSETRON HCL 4 MG PO TABS: 4.0000 mg | ORAL_TABLET | Freq: Three times a day (TID) | ORAL | 2 refills | Status: DC | PRN

## 2022-01-11 MED ORDER — LABETALOL HCL 100 MG PO TABS
100.0000 mg | ORAL_TABLET | Freq: Once | ORAL | Status: AC
  Administered 2022-01-11: 100 mg via ORAL
  Filled 2022-01-11: qty 1

## 2022-01-11 MED ORDER — ONDANSETRON HCL 4 MG/2ML IJ SOLN
4.0000 mg | Freq: Once | INTRAMUSCULAR | Status: AC
  Administered 2022-01-11: 4 mg via INTRAVENOUS
  Filled 2022-01-11: qty 2

## 2022-01-11 MED ORDER — PROMETHAZINE HCL 25 MG PO TABS: 25.0000 mg | ORAL_TABLET | Freq: Four times a day (QID) | ORAL | 2 refills | Status: DC | PRN

## 2022-01-11 MED ORDER — FAMOTIDINE 20 MG PO TABS
20.0000 mg | ORAL_TABLET | Freq: Two times a day (BID) | ORAL | 2 refills | Status: DC
Start: 2022-01-11 — End: 2023-07-24

## 2022-01-11 MED ORDER — METOCLOPRAMIDE HCL 10 MG PO TABS: 10.0000 mg | ORAL_TABLET | Freq: Four times a day (QID) | ORAL | 2 refills | Status: DC

## 2022-01-11 NOTE — MAU Note (Addendum)
...  Krista Adams is a 36 y.o. at Unknown here in MAU reporting: Constant N/V since this past Sunday as well as lower abdominal cramping. She states she has not been able to keep anything down. She states she has not taken a pregnancy test but there is a chance she could be pregnant as she is sexually active. No VB or abnormal discharge.  Takes Labetalol 100 mg QD for [facility] but has not been able to take it since Sunday due to N/V.  BP: 193/98 P: 57  Positive pregnancy test in MAU.   LMP: Unsure. She states she bled twice in January.  Onset of complaint: 01/07/2022  Pain score:  4/10 lower abdmoen  Lab orders placed from triage: UA

## 2022-01-11 NOTE — MAU Provider Note (Signed)
History     CSN: [id number]  Arrival date and time: 01/11/22 1523   Event Date/Time   First Provider Initiated Contact with Patient 01/11/22 1604      Chief Complaint  Patient presents with   Abdominal Pain   Nausea   Emesis   HPI Krista Adams is a 36 y.o. G1P0 at 6 weeks who presents with nausea and vomiting. She states she has been vomiting non stop since Sunday. She has tried phenergan but thrown it up. She also reports mid abdominal pain that is cramping in nature. She rates the pain a 3/10 and has not tried anything for the pain. She denies any bleeding or discharge.   OB History     Gravida  1   Para      Term      Preterm      AB      Living         SAB      IAB      Ectopic      Multiple      Live Births              Past Medical History:  Diagnosis Date   Anovulatory (dysfunctional uterine) bleeding 03/13/2012   Anxiety    Depression    Depression with anxiety 03/18/2012   Diabetes mellitus without complication (HCC)    DM (diabetes mellitus), type 2, uncontrolled 05/06/2008   Qualifier: Diagnosis of  By: Lanier Prude  MD, Taineisha     Essential hypertension, benign 05/06/2008   Qualifier: Diagnosis of  By: Darrol Angel MD, Nathan     Morbid obesity (HCC) 05/06/2008   Qualifier: Diagnosis of  By: Darrol Angel MD, Harrold Donath      Past Surgical History:  Procedure Laterality Date   CHOLECYSTECTOMY      Family History  Problem Relation Age of Onset   Diabetes Mother    Hypertension Mother    Diabetes Maternal Aunt    Diabetes Maternal Grandmother    Hypertension Father     Social History   Tobacco Use   Smoking status: Former    Packs/day: 1.00    Types: Cigars, Cigarettes   Smokeless tobacco: Never   Tobacco comments:    smokes black and mild ; reports not smoking as much  Substance Use Topics   Alcohol use: No   Drug use: Yes    Types: Marijuana    Allergies:  Allergies  Allergen Reactions   Pineapple     itching    Medications Prior  to Admission  Medication Sig Dispense Refill Last Dose   labetalol (NORMODYNE) 100 MG tablet Take 1 tablet (100 mg total) by mouth daily. 90 tablet 1 Past Week   blood glucose meter kit and supplies KIT Dispense based on patient and insurance preference. Use up to four times daily as directed. (FOR ICD-9 250.00, 250.01). 1 each 0    citalopram (CELEXA) 20 MG tablet Take 1 tablet (20 mg total) by mouth daily. 90 tablet 1    esomeprazole (NEXIUM) 40 MG capsule TAKE 1 CAPSULE (40 MG TOTAL) BY MOUTH DAILY. 90 capsule 1    ferrous sulfate 325 (65 FE) MG tablet TAKE 1 TABLET BY MOUTH EVERY DAY 90 tablet 1    glucose blood (ACCU-CHEK AVIVA PLUS) test strip Use to check blood sugars twice daily E11.69 100 each 2    Magnesium 200 MG TABS Take 1 tablet by mouth daily with evening meal 90 tablet 1  naproxen (NAPROSYN) 500 MG tablet TAKE 1 TABLET BY MOUTH TWICE A DAY AS NEEDED 60 tablet 2    ondansetron (ZOFRAN) 4 MG tablet TAKE 1 TABLET BY MOUTH EVERY 8 HOURS AS NEEDED FOR NAUSEA AND VOMITING 30 tablet 1    promethazine (PHENERGAN) 25 MG tablet Take 1 tablet (25 mg total) by mouth every 6 (six) hours as needed for nausea or vomiting. 30 tablet 0    RYBELSUS 14 MG TABS TAKE 1 TABLET BY MOUTH DAILY. TAKE 30 MINUTES BEFORE EATING BREAKFAST 90 tablet 1     Review of Systems  Constitutional: Negative.  Negative for fatigue and fever.  HENT: Negative.    Respiratory: Negative.  Negative for shortness of breath.   Cardiovascular: Negative.  Negative for chest pain.  Gastrointestinal:  Positive for abdominal pain, nausea and vomiting. Negative for constipation and diarrhea.  Genitourinary: Negative.  Negative for dysuria, vaginal bleeding and vaginal discharge.  Neurological: Negative.  Negative for dizziness and headaches.  Physical Exam   Blood pressure (!) 169/85, pulse (!) 57, temperature 98.8 F (37.1 C), temperature source Oral, resp. rate 17, height $RemoveBe'5\' 4"'IraNPaYLM$  (1.626 m), weight 95.1 kg, SpO2 100  %.  Physical Exam Vitals and nursing note reviewed.  Constitutional:      General: She is not in acute distress.    Appearance: She is well-developed.  HENT:     Head: Normocephalic.  Eyes:     Pupils: Pupils are equal, round, and reactive to light.  Cardiovascular:     Rate and Rhythm: Normal rate and regular rhythm.     Heart sounds: Normal heart sounds.  Pulmonary:     Effort: Pulmonary effort is normal. No respiratory distress.     Breath sounds: Normal breath sounds.  Abdominal:     General: Bowel sounds are normal. There is no distension.     Palpations: Abdomen is soft.     Tenderness: There is no abdominal tenderness.  Skin:    General: Skin is warm and dry.  Neurological:     Mental Status: She is alert and oriented to person, place, and time.  Psychiatric:        Mood and Affect: Mood normal.        Behavior: Behavior normal.        Thought Content: Thought content normal.        Judgment: Judgment normal.     MAU Course  Procedures Results for orders placed or performed during the hospital encounter of 01/11/22 (from the past 24 hour(s))  Pregnancy, urine POC     Status: Abnormal   Collection Time: 01/11/22  3:36 PM  Result Value Ref Range   Preg Test, Ur POSITIVE (A) NEGATIVE  Urinalysis, Routine w reflex microscopic Urine, Clean Catch     Status: Abnormal   Collection Time: 01/11/22  3:41 PM  Result Value Ref Range   Color, Urine YELLOW YELLOW   APPearance HAZY (A) CLEAR   Specific Gravity, Urine 1.014 1.005 - 1.030   pH 6.0 5.0 - 8.0   Glucose, UA NEGATIVE NEGATIVE mg/dL   Hgb urine dipstick NEGATIVE NEGATIVE   Bilirubin Urine NEGATIVE NEGATIVE   Ketones, ur NEGATIVE NEGATIVE mg/dL   Protein, ur NEGATIVE NEGATIVE mg/dL   Nitrite NEGATIVE NEGATIVE   Leukocytes,Ua LARGE (A) NEGATIVE   RBC / HPF 0-5 0 - 5 RBC/hpf   WBC, UA 0-5 0 - 5 WBC/hpf   Bacteria, UA NONE SEEN NONE SEEN   Squamous Epithelial / LPF  0-5 0 - 5   Mucus PRESENT   CBC     Status:  Abnormal   Collection Time: 01/11/22  4:21 PM  Result Value Ref Range   WBC 3.0 (L) 4.0 - 10.5 K/uL   RBC 4.34 3.87 - 5.11 MIL/uL   Hemoglobin 13.1 12.0 - 15.0 g/dL   HCT 39.2 36.0 - 46.0 %   MCV 90.3 80.0 - 100.0 fL   MCH 30.2 26.0 - 34.0 pg   MCHC 33.4 30.0 - 36.0 g/dL   RDW 13.5 11.5 - 15.5 %   Platelets 252 150 - 400 K/uL   nRBC 0.0 0.0 - 0.2 %  hCG, quantitative, pregnancy     Status: Abnormal   Collection Time: 01/11/22  4:21 PM  Result Value Ref Range   hCG, Beta Chain, Quant, S 62,261 (H) <5 mIU/mL  ABO/Rh     Status: None   Collection Time: 01/11/22  4:21 PM  Result Value Ref Range   ABO/RH(D) B POS    No rh immune globuloin      NOT A RH IMMUNE GLOBULIN CANDIDATE, PT RH POSITIVE Performed at [facility], [place] [address] [place], [place] [address]   Wet prep, genital     Status: Abnormal   Collection Time: 01/11/22  4:21 PM   Specimen: PATH Cytology Cervicovaginal Ancillary Only  Result Value Ref Range   Yeast Wet Prep HPF POC NONE SEEN NONE SEEN   Trich, Wet Prep NONE SEEN NONE SEEN   Clue Cells Wet Prep HPF POC PRESENT (A) NONE SEEN   WBC, Wet Prep HPF POC <10 <10   Sperm NONE SEEN     US OB LESS THAN 14 WEEKS WITH OB TRANSVAGINAL  Result Date: 01/11/2022 CLINICAL DATA:  Abdominal pain, nausea, and vomiting. Quantitative beta hCG in process. EXAM: OBSTETRIC <14 WK [place] AND TRANSVAGINAL OB US DOPPLER ULTRASOUND OF OVARIES TECHNIQUE: Both transabdominal and transvaginal ultrasound examinations were performed for complete evaluation of the gestation as well as the maternal uterus, adnexal regions, and pelvic cul-de-sac. Transvaginal technique was performed to assess early pregnancy. Color and duplex Doppler ultrasound was utilized to evaluate blood flow to the ovaries. COMPARISON:  None for this pregnancy. Nonobstructed pelvic ultrasound 07/08/2014 FINDINGS: Intrauterine gestational sac: Single Yolk sac:  Visualized. Embryo:  Visualized. Cardiac Activity: Visualized.  Heart Rate: 107 bpm CRL: 4.2 mm   6 w 1 d                  [place] EDC: 09/05/2022 Subchorionic hemorrhage:  None visualized. Maternal uterus/adnexae: Maternal adnexae were not visualized. Pulsed Doppler evaluation of both ovaries demonstrates normal appearing low-resistance arterial and venous waveforms. Trace free fluid is seen within the pelvis. IMPRESSION: Single live intrauterine pregnancy with fetal heart rate measured at 107 beats per minute. By today's crown-rump length, estimated gestational age [redacted] weeks 1 day. Recommend continued follow-up. Electronically Signed   By: Yvonne Kendall M.D.   On: 01/11/2022 17:30     MDM UA, UPT CBC, HCG ABO/Rh- B Pos Wet prep and gc/chlamydia US OB Comp Less 14 weeks with Transvaginal  LR bolus Pepcid Zofran  Patient reports improvement of nausea Labetalol home dose given  Assessment and Plan   1. Normal intrauterine pregnancy on prenatal ultrasound in first trimester   2. Abdominal pain affecting pregnancy   3. [redacted] weeks gestation of pregnancy   4. Nausea/vomiting in pregnancy   5. Nausea    -Discharge home in stable condition -Rx  for zofran, pepcid, phenergan and reglan sent to patient's pharmacy -First trimester precautions discussed -Patient advised to follow-up with OB as scheduled for prenatal care -Patient may return to MAU as needed or if her condition were to change or worsen   [place] 01/11/2022, 4:05 PM

## 2022-01-11 NOTE — MAU Provider Note (Addendum)
History     CSN: [id number]  Arrival date and time: 01/11/22 1523   Event Date/Time   First Provider Initiated Contact with Patient 01/11/22 1604      Chief Complaint  Patient presents with   Abdominal Pain   Nausea   Emesis    Krista Adams is a 36yo G1P0 with history of TIIDM and HTN with a current pregnancy of unknown duration who presents with nausea and vomiting and lower abdominal cramping. Her symptoms began when she woke up feeling nauseated on Sunday. She began vomiting, and the vomit appeared green with no blood. She tried to take 2 pills of promethazine on Sunday, but she had to vomit. The vomiting has been constant, so she has not attempted to take her medications for her chronic conditions. She tolerated most of a Pedialyte, but drinking water makes her feel sick. She has not had an appetite but has been able to eat crackers. She feels nauseous regardless of her activity. She has not been sick and denies sick contacts, and she has never had nausea and vomiting like this before. She has also had abdominal pain since Sunday. She said she has felt cramping and sharp pain below her naval. She said the pain wakes her up at night but currently rates it 4/10. She has had night sweats but denies all other symptoms.  Past Medical History:  Diagnosis Date   Anovulatory (dysfunctional uterine) bleeding 03/13/2012   Anxiety    Depression    Depression with anxiety 03/18/2012   Diabetes mellitus without complication (HCC)    DM (diabetes mellitus), type 2, uncontrolled 05/06/2008   Qualifier: Diagnosis of  By: Drue Flirt  MD, Taineisha     Essential hypertension, benign 05/06/2008   Qualifier: Diagnosis of  By: Jamal Collin MD, Nathan     Morbid obesity ([place]) 05/06/2008   Qualifier: Diagnosis of  By: Jamal Collin MD, Ovid Curd      Past Surgical History:  Procedure Laterality Date   CHOLECYSTECTOMY      Family History  Problem Relation Age of Onset   Diabetes Mother    Hypertension Mother     Diabetes Maternal Aunt    Diabetes Maternal Grandmother    Hypertension Father     Social History   Tobacco Use   Smoking status: Former    Packs/day: 1.00    Types: Cigars, Cigarettes   Smokeless tobacco: Never   Tobacco comments:    smokes black and mild ; reports not smoking as much  Substance Use Topics   Alcohol use: No   Drug use: Yes    Types: Marijuana    Allergies:  Allergies  Allergen Reactions   Pineapple     itching    Medications Prior to Admission  Medication Sig Dispense Refill Last Dose   labetalol (NORMODYNE) 100 MG tablet Take 1 tablet (100 mg total) by mouth daily. 90 tablet 1 Past Week   blood glucose meter kit and supplies KIT Dispense based on patient and insurance preference. Use up to four times daily as directed. (FOR ICD-9 250.00, 250.01). 1 each 0    citalopram (CELEXA) 20 MG tablet Take 1 tablet (20 mg total) by mouth daily. 90 tablet 1    esomeprazole (NEXIUM) 40 MG capsule TAKE 1 CAPSULE (40 MG TOTAL) BY MOUTH DAILY. 90 capsule 1    ferrous sulfate 325 (65 FE) MG tablet TAKE 1 TABLET BY MOUTH EVERY DAY 90 tablet 1    glucose blood (ACCU-CHEK AVIVA PLUS) test strip  Use to check blood sugars twice daily E11.69 100 each 2    Magnesium 200 MG TABS Take 1 tablet by mouth daily with evening meal 90 tablet 1    naproxen (NAPROSYN) 500 MG tablet TAKE 1 TABLET BY MOUTH TWICE A DAY AS NEEDED 60 tablet 2    ondansetron (ZOFRAN) 4 MG tablet TAKE 1 TABLET BY MOUTH EVERY 8 HOURS AS NEEDED FOR NAUSEA AND VOMITING 30 tablet 1    promethazine (PHENERGAN) 25 MG tablet Take 1 tablet (25 mg total) by mouth every 6 (six) hours as needed for nausea or vomiting. 30 tablet 0    RYBELSUS 14 MG TABS TAKE 1 TABLET BY MOUTH DAILY. TAKE 30 MINUTES BEFORE EATING BREAKFAST 90 tablet 1     Review of Systems  Constitutional:  Positive for appetite change and diaphoresis. Negative for fatigue.  Eyes:  Negative for visual disturbance.  Respiratory:  Negative for shortness of  breath.   Cardiovascular:  Negative for chest pain.  Gastrointestinal:  Positive for abdominal pain, nausea and vomiting. Negative for constipation and diarrhea.  Endocrine: Positive for polyuria.  Genitourinary:  Negative for difficulty urinating, vaginal bleeding and vaginal pain.  Musculoskeletal:  Negative for gait problem.  Psychiatric/Behavioral:  Positive for sleep disturbance.   Physical Exam   Blood pressure (!) 169/85, pulse (!) 57, temperature 98.8 F (37.1 C), temperature source Oral, resp. rate 17, height $RemoveBe'5\' 4"'DtOnbHahN$  (1.626 m), weight 95.1 kg, SpO2 100 %.  Physical Exam Vitals and nursing note reviewed.  Constitutional:      General: She is not in acute distress. HENT:     Head: Normocephalic and atraumatic.  Pulmonary:     Effort: Pulmonary effort is normal.  Skin:    General: Skin is warm and dry.  Neurological:     Mental Status: She is alert.  Psychiatric:        Behavior: Behavior normal.    MAU Course  Procedures Results for orders placed or performed during the hospital encounter of 01/11/22 (from the past 24 hour(s))  Pregnancy, urine POC     Status: Abnormal   Collection Time: 01/11/22  3:36 PM  Result Value Ref Range   Preg Test, Ur POSITIVE (A) NEGATIVE  Urinalysis, Routine w reflex microscopic Urine, Clean Catch     Status: Abnormal   Collection Time: 01/11/22  3:41 PM  Result Value Ref Range   Color, Urine YELLOW YELLOW   APPearance HAZY (A) CLEAR   Specific Gravity, Urine 1.014 1.005 - 1.030   pH 6.0 5.0 - 8.0   Glucose, UA NEGATIVE NEGATIVE mg/dL   Hgb urine dipstick NEGATIVE NEGATIVE   Bilirubin Urine NEGATIVE NEGATIVE   Ketones, ur NEGATIVE NEGATIVE mg/dL   Protein, ur NEGATIVE NEGATIVE mg/dL   Nitrite NEGATIVE NEGATIVE   Leukocytes,Ua LARGE (A) NEGATIVE   RBC / HPF 0-5 0 - 5 RBC/hpf   WBC, UA 0-5 0 - 5 WBC/hpf   Bacteria, UA NONE SEEN NONE SEEN   Squamous Epithelial / LPF 0-5 0 - 5   Mucus PRESENT   CBC     Status: Abnormal   Collection  Time: 01/11/22  4:21 PM  Result Value Ref Range   WBC 3.0 (L) 4.0 - 10.5 K/uL   RBC 4.34 3.87 - 5.11 MIL/uL   Hemoglobin 13.1 12.0 - 15.0 g/dL   HCT 39.2 36.0 - 46.0 %   MCV 90.3 80.0 - 100.0 fL   MCH 30.2 26.0 - 34.0 pg   MCHC 33.4  30.0 - 36.0 g/dL   RDW 13.5 11.5 - 15.5 %   Platelets 252 150 - 400 K/uL   nRBC 0.0 0.0 - 0.2 %  hCG, quantitative, pregnancy     Status: Abnormal   Collection Time: 01/11/22  4:21 PM  Result Value Ref Range   hCG, Beta Chain, Quant, S 62,261 (H) <5 mIU/mL  ABO/Rh     Status: None   Collection Time: 01/11/22  4:21 PM  Result Value Ref Range   ABO/RH(D) B POS    No rh immune globuloin      NOT A RH IMMUNE GLOBULIN CANDIDATE, PT RH POSITIVE Performed at [facility] [address], [place], [place] [address]   Wet prep, genital     Status: Abnormal   Collection Time: 01/11/22  4:21 PM   Specimen: PATH Cytology Cervicovaginal Ancillary Only  Result Value Ref Range   Yeast Wet Prep HPF POC NONE SEEN NONE SEEN   Trich, Wet Prep NONE SEEN NONE SEEN   Clue Cells Wet Prep HPF POC PRESENT (A) NONE SEEN   WBC, Wet Prep HPF POC <10 <10   Sperm NONE SEEN     US OB LESS THAN 14 WEEKS WITH OB TRANSVAGINAL  Result Date: 01/11/2022 CLINICAL DATA:  Abdominal pain, nausea, and vomiting. Quantitative beta hCG in process. EXAM: OBSTETRIC <14 WK [place] AND TRANSVAGINAL OB US DOPPLER ULTRASOUND OF OVARIES TECHNIQUE: Both transabdominal and transvaginal ultrasound examinations were performed for complete evaluation of the gestation as well as the maternal uterus, adnexal regions, and pelvic cul-de-sac. Transvaginal technique was performed to assess early pregnancy. Color and duplex Doppler ultrasound was utilized to evaluate blood flow to the ovaries. COMPARISON:  None for this pregnancy. Nonobstructed pelvic ultrasound 07/08/2014 FINDINGS: Intrauterine gestational sac: Single Yolk sac:  Visualized. Embryo:  Visualized. Cardiac Activity: Visualized. Heart Rate: 107 bpm  CRL: 4.2 mm   6 w 1 d                  [place] EDC: 09/05/2022 Subchorionic hemorrhage:  None visualized. Maternal uterus/adnexae: Maternal adnexae were not visualized. Pulsed Doppler evaluation of both ovaries demonstrates normal appearing low-resistance arterial and venous waveforms. Trace free fluid is seen within the pelvis. IMPRESSION: Single live intrauterine pregnancy with fetal heart rate measured at 107 beats per minute. By today's crown-rump length, estimated gestational age [redacted] weeks 1 day. Recommend continued follow-up. Electronically Signed   By: Yvonne Kendall M.D.   On: 01/11/2022 17:30      MDM Patient presented to MAU reporting nausea and vomiting since Sunday and lower abdominal pain. Patient believed she might be pregnant due to recent sexual activity, and urine POC pregnancy test positive. BP at admission 193/98. UA was collected. At 1620, LR bolus 1,041mL, Zofran $RemoveBeforeD'4mg'YPNLwRVPYnJjzu$ , Pepcid $Remove'20mg'taTWeeE$  given for nausea. Additional tests ordered included hCG quantitative, CBC, GC/chlamydia, wet prep, and ABO/Rh. Quant hCG 86,761. Other labs normal with large leukocytes on UA and some clue cells on wet prep. IV Labetalol given at 1717. Ordered transvaginal ultrasound, and it confirmed single, live intrauterine pregnancy with estimated age 52 weeks and 1 day. Patient informed. Given early prenatal counseling, and discussed prescriptions that will be ordered for nausea, vomiting, and pain.  Assessment and Plan   Krista Adams is a 36yo G1P0 with history of TIIDM and HTN with a current pregnancy of unknown duration who presents with nausea and vomiting and lower abdominal cramping currently in stable condition. Pregnancy is most likely cause of symptom  presentation, and prescriptions will be ordered. She plans to pursue prenatal care at [facility].  Nausea and vomiting - Meds prescribed:      - Pepcid 20mg  twice daily      - Metoclopramide 10mg  daily      - Scopolamine patch every 3 days      - Esomeprazole  40mg  daily      - Zofran 4mg  q8hr      - Promethazine 25mg  q6hr      - Magnesium 200mg  daily   Chronic HTN - Continue Labetalol 100mg  PO daily  Type II Diabetes - Glucose meter and test strips ordered - Discontinued Rybelsus 14mg  daily  History of anxiety and depression - Continue Celexa 20mg  daily  Pregnancy at 6 weeks and 1 day - Continue prenatal vitamins - Routine prenatal care  - Ferrous sulfate 325mg  daily  Danie Chandler 01/11/2022, 4:19 PM   Attestation of Supervision of Student:  I confirm that I have verified the information documented in the medical students note and that I have also personally reperformed the history, physical exam and all medical decision making activities.  I have verified that all services and findings are accurately documented in this student's note; and I agree with management and plan as outlined in the documentation. I have also made any necessary editorial changes.   Wende Mott, [place] for [employer] 01/11/2022 6:46 PM

## 2022-01-11 NOTE — Discharge Instructions (Signed)

## 2022-01-12 LAB — GC/CHLAMYDIA PROBE AMP (~~LOC~~) NOT AT ARMC
Chlamydia: NEGATIVE
Comment: NEGATIVE
Comment: NORMAL
Neisseria Gonorrhea: NEGATIVE

## 2022-01-13 ENCOUNTER — Other Ambulatory Visit: Payer: Self-pay | Admitting: Nurse Practitioner

## 2022-01-13 DIAGNOSIS — I10 Essential (primary) hypertension: Secondary | ICD-10-CM

## 2022-01-19 ENCOUNTER — Other Ambulatory Visit: Payer: Self-pay

## 2022-01-30 ENCOUNTER — Encounter: Payer: Self-pay | Admitting: Nurse Practitioner

## 2022-01-30 ENCOUNTER — Ambulatory Visit: Payer: BC Managed Care – PPO | Admitting: Nurse Practitioner

## 2022-01-30 ENCOUNTER — Other Ambulatory Visit: Payer: Self-pay | Admitting: Nurse Practitioner

## 2022-01-30 ENCOUNTER — Other Ambulatory Visit: Payer: Self-pay

## 2022-01-30 VITALS — BP 138/80 | HR 84 | Temp 98.1°F | Ht 64.0 in | Wt 215.8 lb

## 2022-01-30 DIAGNOSIS — Z3A09 9 weeks gestation of pregnancy: Secondary | ICD-10-CM

## 2022-01-30 DIAGNOSIS — I10 Essential (primary) hypertension: Secondary | ICD-10-CM | POA: Diagnosis not present

## 2022-01-30 DIAGNOSIS — E1169 Type 2 diabetes mellitus with other specified complication: Secondary | ICD-10-CM | POA: Diagnosis not present

## 2022-01-30 DIAGNOSIS — Z6837 Body mass index (BMI) 37.0-37.9, adult: Secondary | ICD-10-CM

## 2022-01-30 DIAGNOSIS — E119 Type 2 diabetes mellitus without complications: Secondary | ICD-10-CM

## 2022-01-30 NOTE — Patient Instructions (Addendum)
Hypertension, Adult Hypertension is another name for high blood pressure. High blood pressure forces your heart to work harder to pump blood. This can cause problems over time. There are two numbers in a blood pressure reading. There is a top number (systolic) over a bottom number (diastolic). It is best to have a blood pressure that is below 120/80. Healthy choices can help lower your blood pressure, or you may need medicine to help lower it. What are the causes? The cause of this condition is not known. Some conditions may be related to high blood pressure. What increases the risk? Smoking. Having type 2 diabetes mellitus, high cholesterol, or both. Not getting enough exercise or physical activity. Being overweight. Having too much fat, sugar, calories, or salt (sodium) in your diet. Drinking too much alcohol. Having long-term (chronic) kidney disease. Having a family history of high blood pressure. Age. Risk increases with age. Race. You may be at higher risk if you are African American. Gender. Men are at higher risk than women before age 29. After age 92, women are at higher risk than men. Having obstructive sleep apnea. Stress. What are the signs or symptoms? High blood pressure may not cause symptoms. Very high blood pressure (hypertensive crisis) may cause: Headache. Feelings of worry or nervousness (anxiety). Shortness of breath. Nosebleed. A feeling of being sick to your stomach (nausea). Throwing up (vomiting). Changes in how you see. Very bad chest pain. Seizures. How is this treated? This condition is treated by making healthy lifestyle changes, such as: Eating healthy foods. Exercising more. Drinking less alcohol. Your health care provider may prescribe medicine if lifestyle changes are not enough to get your blood pressure under control, and if: Your top number is above 130. Your bottom number is above 80. Your personal target blood pressure may vary. Follow  these instructions at home: Eating and drinking  If told, follow the DASH eating plan. To follow this plan: Fill one half of your plate at each meal with fruits and vegetables. Fill one fourth of your plate at each meal with whole grains. Whole grains include whole-wheat pasta, brown rice, and whole-grain bread. Eat or drink low-fat dairy products, such as skim milk or low-fat yogurt. Fill one fourth of your plate at each meal with low-fat (lean) proteins. Low-fat proteins include fish, chicken without skin, eggs, beans, and tofu. Avoid fatty meat, cured and processed meat, or chicken with skin. Avoid pre-made or processed food. Eat less than 1,500 mg of salt each day. Do not drink alcohol if: Your doctor tells you not to drink. You are pregnant, may be pregnant, or are planning to become pregnant. If you drink alcohol: Limit how much you use to: 0-1 drink a day for women. 0-2 drinks a day for men. Be aware of how much alcohol is in your drink. In the [place] one drink equals one 12 oz bottle of beer (355 mL), one 5 oz glass of wine (148 mL), or one 1 oz glass of hard liquor (44 mL). Lifestyle  Work with your doctor to stay at a healthy weight or to lose weight. Ask your doctor what the best weight is for you. Get at least 30 minutes of exercise most days of the week. This may include walking, swimming, or biking. Get at least 30 minutes of exercise that strengthens your muscles (resistance exercise) at least 3 days a week. This may include lifting weights or doing Pilates. Do not use any products that contain nicotine or tobacco, such  as cigarettes, e-cigarettes, and chewing tobacco. If you need help quitting, ask your doctor. Check your blood pressure at home as told by your doctor. Keep all follow-up visits as told by your doctor. This is important. Medicines Take over-the-counter and prescription medicines only as told by your doctor. Follow directions carefully. Do not skip doses of  blood pressure medicine. The medicine does not work as well if you skip doses. Skipping doses also puts you at risk for problems. Ask your doctor about side effects or reactions to medicines that you should watch for. Contact a doctor if you: Think you are having a reaction to the medicine you are taking. Have headaches that keep coming back (recurring). Feel dizzy. Have swelling in your ankles. Have trouble with your vision. Get help right away if you: Get a very bad headache. Start to feel mixed up (confused). Feel weak or numb. Feel faint. Have very bad pain in your: Chest. Belly (abdomen). Throw up more than once. Have trouble breathing. Summary Hypertension is another name for high blood pressure. High blood pressure forces your heart to work harder to pump blood. For most people, a normal blood pressure is less than 120/80. Making healthy choices can help lower blood pressure. If your blood pressure does not get lower with healthy choices, you may need to take medicine. This information is not intended to replace advice given to you by your health care provider. Make sure you discuss any questions you have with your health care provider. Document Revised: 07/30/2018 Document Reviewed: 07/30/2018 Elsevier Patient Education  2022 Elsevier Inc.   [phone] Dr. Leonie Green - Dermatology.

## 2022-01-30 NOTE — Progress Notes (Signed)
I,Victoria T Hamilton,acting as a Education administrator for [name], FNP.,have documented all relevant documentation on the behalf of [name], FNP,as directed by  [name], FNP while in the presence of [name], [place].   This visit occurred during the SARS-CoV-2 public health emergency.  Safety protocols were in place, including screening questions prior to the visit, additional usage of staff PPE, and extensive cleaning of exam room while observing appropriate contact time as indicated for disinfecting solutions.  Subjective:     Patient ID: Krista Adams , female    DOB: 11/26/1986 , 36 y.o.   MRN: [id number]   Chief Complaint  Patient presents with   Hypertension   Diabetes    HPI  BP & DM F/U. Patient reports expecting a baby. She is due October 4th. 2 months & 6 weeks. She is having problems with nausea. She continues to have hair loss at the front of her head.   Wt Readings from Last 3 Encounters: 01/30/22 : 215 lb 12.8 oz (97.9 kg) 01/11/22 : 209 lb 11.2 oz (95.1 kg) 09/26/21 : 218 lb (98.9 kg)      Past Medical History:  Diagnosis Date   Anovulatory (dysfunctional uterine) bleeding 03/13/2012   Anxiety    Depression    Depression with anxiety 03/18/2012   Diabetes mellitus without complication (HCC)    DM (diabetes mellitus), type 2, uncontrolled 05/06/2008   Qualifier: Diagnosis of  By: Drue Flirt  MD, Taineisha     Essential hypertension, benign 05/06/2008   Qualifier: Diagnosis of  By: Jamal Collin MD, Nathan     Morbid obesity ([place]) 05/06/2008   Qualifier: Diagnosis of  By: Jamal Collin MD, Ovid Curd       Family History  Problem Relation Age of Onset   Diabetes Mother    Hypertension Mother    Diabetes Maternal Aunt    Diabetes Maternal Grandmother    Hypertension Father      Current Outpatient Medications:    blood glucose meter kit and supplies KIT, Dispense based on patient and insurance preference. Use up to four times daily as directed. (FOR ICD-9 250.00, 250.01).,  Disp: 1 each, Rfl: 0   famotidine (PEPCID) 20 MG tablet, Take 1 tablet (20 mg total) by mouth 2 (two) times daily., Disp: 30 tablet, Rfl: 2   ferrous sulfate 325 (65 FE) MG tablet, TAKE 1 TABLET BY MOUTH EVERY DAY, Disp: 90 tablet, Rfl: 1   glucose blood (ACCU-CHEK AVIVA PLUS) test strip, Use to check blood sugars twice daily E11.69, Disp: 100 each, Rfl: 2   labetalol (NORMODYNE) 100 MG tablet, TAKE 1 TABLET BY MOUTH EVERY DAY, Disp: 90 tablet, Rfl: 1   metoCLOPramide (REGLAN) 10 MG tablet, Take 1 tablet (10 mg total) by mouth every 6 (six) hours., Disp: 30 tablet, Rfl: 2   ondansetron (ZOFRAN) 4 MG tablet, Take 1 tablet (4 mg total) by mouth every 8 (eight) hours as needed for nausea or vomiting. (Patient not taking: Reported on 01/30/2022), Disp: 30 tablet, Rfl: 2   promethazine (PHENERGAN) 25 MG tablet, Take 1 tablet (25 mg total) by mouth every 6 (six) hours as needed for nausea or vomiting. (Patient not taking: Reported on 01/30/2022), Disp: 30 tablet, Rfl: 2   scopolamine (TRANSDERM-SCOP) 1 MG/3DAYS, Place 1 patch (1.5 mg total) onto the skin every 3 (three) days. (Patient not taking: Reported on 01/30/2022), Disp: 10 patch, Rfl: 12   Allergies  Allergen Reactions   Pineapple     itching  Review of Systems  Constitutional: Negative.   Respiratory: Negative.    Cardiovascular: Negative.   Neurological: Negative.   Psychiatric/Behavioral: Negative.      Today's Vitals   01/30/22 1540  BP: 138/80  Pulse: 84  Temp: 98.1 F (36.7 C)  Weight: 215 lb 12.8 oz (97.9 kg)  Height: _0  (1.626 m)   Body mass index is 37.04 kg/m.  Wt Readings from Last 3 Encounters:  01/30/22 215 lb 12.8 oz (97.9 kg)  01/11/22 209 lb 11.2 oz (95.1 kg)  09/26/21 218 lb (98.9 kg)    Objective:  Physical Exam Vitals reviewed.  Constitutional:      General: She is not in acute distress.    Appearance: Normal appearance. She is obese.  Cardiovascular:     Rate and Rhythm: Normal rate and regular  rhythm.     Pulses: Normal pulses.     Heart sounds: Normal heart sounds. No murmur heard. Pulmonary:     Effort: Pulmonary effort is normal. No respiratory distress.     Breath sounds: Normal breath sounds. No wheezing.  Neurological:     General: No focal deficit present.     Mental Status: She is alert and oriented to person, place, and time.     Cranial Nerves: No cranial nerve deficit.     Motor: No weakness.  Psychiatric:        Mood and Affect: Mood normal.        Behavior: Behavior normal.        Thought Content: Thought content normal.        Judgment: Judgment normal.        Assessment And Plan:     1. Essential hypertension, benign Comments: Blood pressure is fairly controlled. Continue current medications  2. Diabetes mellitus type 2 in obese [facility] Comments: HgbA1c is much better controlled, her OB is keeping a close eye due to being [redacted] weeks pregnant - Lipid panel - Hemoglobin A1c - COMPLETE METABOLIC PANEL WITH GFR  3. [redacted] weeks gestation of pregnancy Comments: Continue follow up with high risk pregnancy provider - CBC  4. Morbid obesity ([place])     Patient was given opportunity to ask questions. Patient verbalized understanding of the plan and was able to repeat key elements of the plan. All questions were answered to their satisfaction.  [name], FNP   I, [name], FNP, have reviewed all documentation for this visit. The documentation on 02/12/22 for the exam, diagnosis, procedures, and orders are all accurate and complete.   IF YOU HAVE BEEN REFERRED TO A SPECIALIST, IT MAY TAKE 1-2 WEEKS TO SCHEDULE/PROCESS THE REFERRAL. IF YOU HAVE NOT HEARD FROM US/SPECIALIST IN TWO WEEKS, PLEASE GIVE [place] A CALL AT [phone].   THE PATIENT IS ENCOURAGED TO PRACTICE SOCIAL DISTANCING DUE TO THE COVID-19 PANDEMIC.

## 2022-02-06 DIAGNOSIS — O169 Unspecified maternal hypertension, unspecified trimester: Secondary | ICD-10-CM | POA: Insufficient documentation

## 2022-02-06 DIAGNOSIS — O24119 Pre-existing diabetes mellitus, type 2, in pregnancy, unspecified trimester: Secondary | ICD-10-CM | POA: Insufficient documentation

## 2022-02-06 DIAGNOSIS — O09519 Supervision of elderly primigravida, unspecified trimester: Secondary | ICD-10-CM | POA: Insufficient documentation

## 2022-02-06 LAB — OB RESULTS CONSOLE ABO/RH: RH Type: POSITIVE

## 2022-02-06 LAB — OB RESULTS CONSOLE GC/CHLAMYDIA
Chlamydia: NEGATIVE
Neisseria Gonorrhea: NEGATIVE

## 2022-02-06 LAB — OB RESULTS CONSOLE HEPATITIS B SURFACE ANTIGEN: Hepatitis B Surface Ag: NEGATIVE

## 2022-02-06 LAB — OB RESULTS CONSOLE ANTIBODY SCREEN: Antibody Screen: POSITIVE

## 2022-02-06 LAB — OB RESULTS CONSOLE HIV ANTIBODY (ROUTINE TESTING): HIV: NONREACTIVE

## 2022-02-06 LAB — OB RESULTS CONSOLE RPR: RPR: NONREACTIVE

## 2022-02-06 LAB — OB RESULTS CONSOLE RUBELLA ANTIBODY, IGM: Rubella: IMMUNE

## 2022-02-06 LAB — OB RESULTS CONSOLE VARICELLA ZOSTER ANTIBODY, IGG: Varicella: IMMUNE

## 2022-02-06 LAB — HEPATITIS C ANTIBODY: HCV Ab: NEGATIVE

## 2022-02-19 ENCOUNTER — Other Ambulatory Visit: Payer: Self-pay

## 2022-02-19 ENCOUNTER — Inpatient Hospital Stay (HOSPITAL_COMMUNITY)
Admission: AD | Admit: 2022-02-19 | Discharge: 2022-02-19 | Disposition: A | Discharge status: Home / Self Care | Payer: BC Managed Care – PPO | Attending: Obstetrics & Gynecology | Admitting: Obstetrics & Gynecology

## 2022-02-19 DIAGNOSIS — O99331 Smoking (tobacco) complicating pregnancy, first trimester: Secondary | ICD-10-CM | POA: Diagnosis not present

## 2022-02-19 DIAGNOSIS — R109 Unspecified abdominal pain: Secondary | ICD-10-CM

## 2022-02-19 DIAGNOSIS — O99211 Obesity complicating pregnancy, first trimester: Secondary | ICD-10-CM | POA: Diagnosis not present

## 2022-02-19 DIAGNOSIS — F32A Depression, unspecified: Secondary | ICD-10-CM | POA: Insufficient documentation

## 2022-02-19 DIAGNOSIS — O10911 Unspecified pre-existing hypertension complicating pregnancy, first trimester: Secondary | ICD-10-CM | POA: Diagnosis not present

## 2022-02-19 DIAGNOSIS — O24111 Pre-existing diabetes mellitus, type 2, in pregnancy, first trimester: Secondary | ICD-10-CM | POA: Diagnosis not present

## 2022-02-19 DIAGNOSIS — F419 Anxiety disorder, unspecified: Secondary | ICD-10-CM | POA: Insufficient documentation

## 2022-02-19 DIAGNOSIS — Z3A11 11 weeks gestation of pregnancy: Secondary | ICD-10-CM | POA: Diagnosis not present

## 2022-02-19 DIAGNOSIS — O26891 Other specified pregnancy related conditions, first trimester: Secondary | ICD-10-CM | POA: Diagnosis present
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
## 2022-03-02 ENCOUNTER — Inpatient Hospital Stay (HOSPITAL_COMMUNITY)
Admission: AD | Admit: 2022-03-02 | Discharge: 2022-03-02 | Disposition: A | Discharge status: Home / Self Care | Payer: BC Managed Care – PPO | Attending: Obstetrics and Gynecology | Admitting: Obstetrics and Gynecology

## 2022-03-02 ENCOUNTER — Inpatient Hospital Stay (HOSPITAL_COMMUNITY): Payer: BC Managed Care – PPO

## 2022-03-02 DIAGNOSIS — N939 Abnormal uterine and vaginal bleeding, unspecified: Secondary | ICD-10-CM

## 2022-03-02 DIAGNOSIS — O209 Hemorrhage in early pregnancy, unspecified: Secondary | ICD-10-CM | POA: Diagnosis present

## 2022-03-02 DIAGNOSIS — Z87891 Personal history of nicotine dependence: Secondary | ICD-10-CM | POA: Diagnosis not present

## 2022-03-02 DIAGNOSIS — Z3A13 13 weeks gestation of pregnancy: Secondary | ICD-10-CM | POA: Insufficient documentation

## 2022-03-02 DIAGNOSIS — Z3491 Encounter for supervision of normal pregnancy, unspecified, first trimester: Secondary | ICD-10-CM

## 2022-03-02 LAB — URINALYSIS, ROUTINE W REFLEX MICROSCOPIC
Bilirubin Urine: NEGATIVE
Glucose, UA: NEGATIVE mg/dL
Ketones, ur: NEGATIVE mg/dL
Nitrite: NEGATIVE
Protein, ur: NEGATIVE mg/dL
Specific Gravity, Urine: 1.02 (ref 1.005–1.030)
pH: 5 (ref 5.0–8.0)

## 2022-03-02 NOTE — MAU Note (Signed)
Went to the [employer] and when I wiped I saw bright blood on the tissue. Mild cramping in "bottom area". I am 35w2dpregnant ?

## 2022-03-02 NOTE — MAU Provider Note (Signed)
?History  ?  ? ?CSN: [id number] ? ?Arrival date and time: 03/02/22 1910 ? ? Event Date/Time  ? First Provider Initiated Contact with Patient 03/02/22 1956   ?  ? ?Chief Complaint  ?Patient presents with  ? Vaginal Bleeding  ? ?HPI ?Krista Adams is a 36 y.o. G1P0 at [redacted]w[redacted]d who presents with vaginal bleeding. She states she went to the bathroom tonight and saw bleeding when she wiped so she came straight to the hospital. She reports the pad she had on has minimal blood on it. She reports pain in her rectum but no other pain. ? ?OB History   ? ? Gravida  ?1  ? Para  ?   ? Term  ?   ? Preterm  ?   ? AB  ?   ? Living  ?   ?  ? ? SAB  ?   ? IAB  ?   ? Ectopic  ?   ? Multiple  ?   ? Live Births  ?   ?   ?  ?  ? ? ?Past Medical History:  ?Diagnosis Date  ? Anovulatory (dysfunctional uterine) bleeding 03/13/2012  ? Anxiety   ? Depression   ? Depression with anxiety 03/18/2012  ? Diabetes mellitus without complication ([place])   ? DM (diabetes mellitus), type 2, uncontrolled 05/06/2008  ? Qualifier: Diagnosis of  By: Drue Flirt  MD, Merrily Brittle    ? Essential hypertension, benign 05/06/2008  ? Qualifier: Diagnosis of  By: Jamal Collin MD, Ovid Curd    ? Morbid obesity ([place]) 05/06/2008  ? Qualifier: Diagnosis of  By: Jamal Collin MD, Ovid Curd    ? ? ?Past Surgical History:  ?Procedure Laterality Date  ? CHOLECYSTECTOMY    ? ? ?Family History  ?Problem Relation Age of Onset  ? Diabetes Mother   ? Hypertension Mother   ? Diabetes Maternal Aunt   ? Diabetes Maternal Grandmother   ? Hypertension Father   ? ? ?Social History  ? ?Tobacco Use  ? Smoking status: Former  ?  Packs/day: 1.00  ?  Types: Cigars, Cigarettes  ? Smokeless tobacco: Never  ? Tobacco comments:  ?  smokes black and mild ; reports not smoking as much  ?Substance Use Topics  ? Alcohol use: No  ? Drug use: Yes  ?  Types: Marijuana  ? ? ?Allergies:  ?Allergies  ?Allergen Reactions  ? Pineapple   ?  itching  ? ? ?Medications Prior to Admission  ?Medication Sig Dispense Refill Last Dose  ? labetalol  (NORMODYNE) 100 MG tablet TAKE 1 TABLET BY MOUTH EVERY DAY 90 tablet 1 03/02/2022  ? blood glucose meter kit and supplies KIT Dispense based on patient and insurance preference. Use up to four times daily as directed. (FOR ICD-9 250.00, 250.01). 1 each 0   ? famotidine (PEPCID) 20 MG tablet Take 1 tablet (20 mg total) by mouth 2 (two) times daily. 30 tablet 2   ? ferrous sulfate 325 (65 FE) MG tablet TAKE 1 TABLET BY MOUTH EVERY DAY 90 tablet 1   ? glucose blood (ACCU-CHEK AVIVA PLUS) test strip Use to check blood sugars twice daily E11.69 100 each 2   ? metoCLOPramide (REGLAN) 10 MG tablet Take 1 tablet (10 mg total) by mouth every 6 (six) hours. 30 tablet 2   ? ondansetron (ZOFRAN) 4 MG tablet Take 1 tablet (4 mg total) by mouth every 8 (eight) hours as needed for nausea or vomiting. (Patient not taking: Reported on 01/30/2022)  30 tablet 2   ? promethazine (PHENERGAN) 25 MG tablet Take 1 tablet (25 mg total) by mouth every 6 (six) hours as needed for nausea or vomiting. (Patient not taking: Reported on 01/30/2022) 30 tablet 2   ? scopolamine (TRANSDERM-SCOP) 1 MG/3DAYS Place 1 patch (1.5 mg total) onto the skin every 3 (three) days. (Patient not taking: Reported on 01/30/2022) 10 patch 12   ? ? ?Review of Systems  ?Constitutional: Negative.  Negative for fatigue and fever.  ?HENT: Negative.    ?Respiratory: Negative.  Negative for shortness of breath.   ?Cardiovascular: Negative.  Negative for chest pain.  ?Gastrointestinal:  Positive for rectal pain. Negative for abdominal pain, constipation, diarrhea, nausea and vomiting.  ?Genitourinary:  Positive for vaginal bleeding. Negative for dysuria and vaginal discharge.  ?Neurological: Negative.  Negative for dizziness and headaches.  ?Physical Exam  ? ?Blood pressure 126/60, pulse 64, temperature 99.2 ?F (37.3 ?C), resp. rate 17, height $RemoveBe'5\' 4"'NMqlEGkoX$  (1.626 m), weight 104.3 kg, SpO2 100 %. ? ?Physical Exam ?Vitals and nursing note reviewed.  ?Constitutional:   ?   General: She is  not in acute distress. ?   Appearance: She is well-developed.  ?HENT:  ?   Head: Normocephalic.  ?Eyes:  ?   Pupils: Pupils are equal, round, and reactive to light.  ?Cardiovascular:  ?   Rate and Rhythm: Normal rate and regular rhythm.  ?   Heart sounds: Normal heart sounds.  ?Pulmonary:  ?   Effort: Pulmonary effort is normal. No respiratory distress.  ?   Breath sounds: Normal breath sounds.  ?Abdominal:  ?   General: Bowel sounds are normal. There is no distension.  ?   Palpations: Abdomen is soft.  ?   Tenderness: There is no abdominal tenderness.  ?Genitourinary: ?   Comments: SSE: small amount of bright red bleeding in vault, cervix visually closed ?Skin: ?   General: Skin is warm and dry.  ?Neurological:  ?   Mental Status: She is alert and oriented to person, place, and time.  ?Psychiatric:     ?   Mood and Affect: Mood normal.     ?   Behavior: Behavior normal.     ?   Thought Content: Thought content normal.     ?   Judgment: Judgment normal.  ? ?FHT: 154 bpm ? ?MAU Course  ?Procedures ?Results for orders placed or performed during the hospital encounter of 03/02/22 (from the past 24 hour(s))  ?Urinalysis, Routine w reflex microscopic Urine, Clean Catch     Status: Abnormal  ? Collection Time: 03/02/22  7:35 PM  ?Result Value Ref Range  ? Color, Urine YELLOW YELLOW  ? APPearance CLEAR CLEAR  ? Specific Gravity, Urine 1.020 1.005 - 1.030  ? pH 5.0 5.0 - 8.0  ? Glucose, UA NEGATIVE NEGATIVE mg/dL  ? Hgb urine dipstick LARGE (A) NEGATIVE  ? Bilirubin Urine NEGATIVE NEGATIVE  ? Ketones, ur NEGATIVE NEGATIVE mg/dL  ? Protein, ur NEGATIVE NEGATIVE mg/dL  ? Nitrite NEGATIVE NEGATIVE  ? Leukocytes,Ua SMALL (A) NEGATIVE  ? RBC / HPF 0-5 0 - 5 RBC/hpf  ? WBC, UA 0-5 0 - 5 WBC/hpf  ? Bacteria, UA RARE (A) NONE SEEN  ? Squamous Epithelial / LPF 0-5 0 - 5  ? ?US OB Comp Less 14 Wks ? ?Result Date: 03/02/2022 ?CLINICAL DATA:  Vaginal bleeding, cramping EXAM: OBSTETRIC <14 WK ULTRASOUND TECHNIQUE: Transabdominal  ultrasound was performed for evaluation of the gestation as well as the  maternal uterus and adnexal regions. COMPARISON:  None. FINDINGS: Intrauterine gestational sac: Single Yolk sac:  Not visualized Embryo:  Visualized Cardiac Activity: Visualized Heart Rate: 159 bpm MSD:    mm    w     d CRL:   71.6 mm   13 w 2 d                  [place] EDC: 09/05/2022 Subchorionic hemorrhage:  None visualized. Maternal uterus/adnexae: No adnexal mass or free fluid. IMPRESSION: Thirteen week 2 day intrauterine pregnancy. Fetal heart rate 159 beats per minute. No acute maternal findings. Electronically Signed   By: Rolm Baptise M.D.   On: 03/02/2022 20:33    ? ?MDM ?UA ?US OB Transvaginal ?B Pos ? ?Reassurance provided of normalcy of testing and exam. Encouraged pelvic rest until bleeding stops and follow up in office this week if she is still seeing spotting.  ?Assessment and Plan  ? ?1. Normal intrauterine pregnancy on prenatal ultrasound in first trimester   ?2. [redacted] weeks gestation of pregnancy   ?3. Vaginal spotting   ? ?-Discharge home in stable condition ?-Vaginal bleeding precautions discussed ?-Patient advised to follow-up with OB as scheduled for prenatal care ?-Patient may return to MAU as needed or if her condition were to change or worsen ? ? ?Wende Mott CNM ?03/02/2022, 7:56 PM  ?

## 2022-03-02 NOTE — Discharge Instructions (Signed)

## 2022-03-14 ENCOUNTER — Other Ambulatory Visit: Payer: Self-pay | Admitting: Nurse Practitioner

## 2022-03-30 ENCOUNTER — Ambulatory Visit: Payer: BC Managed Care – PPO | Admitting: Internal Medicine

## 2022-03-30 NOTE — Progress Notes (Deleted)
Name: Krista Adams  MRN/ DOB: [id number], Dec 14, 1985   Age/ Sex: 36 y.o., female    PCP: [name], FNP   Reason for Endocrinology Evaluation: Type {NUMBERS 1 OR 2:522190} Diabetes Mellitus     Date of Initial Endocrinology Visit: 03/30/2022     PATIENT IDENTIFIER: Krista Adams is a 36 y.o. female with a past medical history of ***. The patient presented for initial endocrinology clinic visit on 03/30/2022 for consultative assistance with her diabetes management.    HPI: Krista Adams was    Diagnosed with DM *** Prior Medications tried/Intolerance: *** Currently checking blood sugars *** x / day,  before breakfast and ***.  Hypoglycemia episodes : ***               Symptoms: ***                 Frequency: ***/  Hemoglobin A1c has ranged from *** in ***, peaking at *** in ***. Patient required assistance for hypoglycemia:  Patient has required hospitalization within the last 1 year from hyper or hypoglycemia:   In terms of diet, the patient ***   HOME DIABETES REGIMEN: Basal: ***  Bolus: ***   Statin: {Yes/No:11203} ACE-I/ARB: {YES/NO:17245} Prior Diabetic Education: {Yes/No:11203}   METER DOWNLOAD SUMMARY: Date range evaluated: *** Fingerstick Blood Glucose Tests = *** Average Number Tests/Day = *** Overall Mean FS Glucose = *** Standard Deviation = ***  BG Ranges: Low = *** High = ***   Hypoglycemic Events/30 Days: BG < 50 = *** Episodes of symptomatic severe hypoglycemia = ***   DIABETIC COMPLICATIONS: Microvascular complications:  *** Denies: *** Last eye exam: Completed   Macrovascular complications:  *** Denies: CAD, PVD, CVA   PAST HISTORY: Past Medical History:  Past Medical History:  Diagnosis Date   Anovulatory (dysfunctional uterine) bleeding 03/13/2012   Anxiety    Depression    Depression with anxiety 03/18/2012   Diabetes mellitus without complication ([facility])    DM (diabetes mellitus), type 2, uncontrolled 05/06/2008    Qualifier: Diagnosis of  By: Drue Flirt  MD, [place]     Essential hypertension, benign 05/06/2008   Qualifier: Diagnosis of  By: Jamal Collin MD, Nathan     Morbid obesity ([place]) 05/06/2008   Qualifier: Diagnosis of  By: Jamal Collin MD, Ovid Curd     Past Surgical History:  Past Surgical History:  Procedure Laterality Date   CHOLECYSTECTOMY      Social History:  reports that she has quit smoking. Her smoking use included cigars and cigarettes. She smoked an average of 1 pack per day. She has never used smokeless tobacco. She reports current drug use. Drug: Marijuana. She reports that she does not drink alcohol. Family History:  Family History  Problem Relation Age of Onset   Diabetes Mother    Hypertension Mother    Diabetes Maternal Aunt    Diabetes Maternal Grandmother    Hypertension Father      HOME MEDICATIONS: Allergies as of 03/30/2022       Reactions   Pineapple    itching        Medication List        Accurate as of March 30, 2022  1:37 PM. If you have any questions, ask your nurse or doctor.          Accu-Chek Aviva Plus test strip Generic drug: glucose blood USE TO CHECK BLOOD SUGARS TWICE DAILY E11.69   blood glucose meter kit and supplies Kit Dispense based  on patient and insurance preference. Use up to four times daily as directed. (FOR ICD-9 250.00, 250.01).   famotidine 20 MG tablet Commonly known as: PEPCID Take 1 tablet (20 mg total) by mouth 2 (two) times daily.   ferrous sulfate 325 (65 FE) MG tablet TAKE 1 TABLET BY MOUTH EVERY DAY   labetalol 100 MG tablet Commonly known as: NORMODYNE TAKE 1 TABLET BY MOUTH EVERY DAY   metoCLOPramide 10 MG tablet Commonly known as: REGLAN Take 1 tablet (10 mg total) by mouth every 6 (six) hours.   ondansetron 4 MG tablet Commonly known as: ZOFRAN Take 1 tablet (4 mg total) by mouth every 8 (eight) hours as needed for nausea or vomiting.   promethazine 25 MG tablet Commonly known as: PHENERGAN Take 1 tablet (25  mg total) by mouth every 6 (six) hours as needed for nausea or vomiting.   scopolamine 1 MG/3DAYS Commonly known as: TRANSDERM-SCOP Place 1 patch (1.5 mg total) onto the skin every 3 (three) days.         ALLERGIES: Allergies  Allergen Reactions   Pineapple     itching     REVIEW OF SYSTEMS: A comprehensive ROS was conducted with the patient and is negative except as per HPI and below:  ROS    OBJECTIVE:   VITAL SIGNS: LMP  (LMP Unknown)    PHYSICAL EXAM:  General: Pt appears well and is in NAD  Hydration: Well-hydrated with moist mucous membranes and good skin turgor  HEENT: Head: Unremarkable with good dentition. Oropharynx clear without exudate.  Eyes: External eye exam normal without stare, lid lag or exophthalmos.  EOM intact.  PERRL.  Neck: General: Supple without adenopathy or carotid bruits. Thyroid: Thyroid size normal.  No goiter or nodules appreciated. No thyroid bruit.  Lungs: Clear with good BS bilat with no rales, rhonchi, or wheezes  Heart: RRR with normal S1 and S2 and no gallops; no murmurs; no rub  Abdomen: Normoactive bowel sounds, soft, nontender, without masses or organomegaly palpable  Extremities:  Lower extremities - No pretibial edema. No lesions.  Skin: Normal texture and temperature to palpation. No rash noted. No Acanthosis nigricans/skin tags. No lipohypertrophy.  Neuro: MS is good with appropriate affect, pt is alert and Ox3    DM foot exam:    DATA REVIEWED:  Lab Results  Component Value Date   HGBA1C 6.0 (H) 09/26/2021   HGBA1C 5.8 (H) 03/23/2021   HGBA1C 5.8 (H) 12/22/2020   Lab Results  Component Value Date   MICROALBUR 30 09/26/2021   LDLCALC 57 09/26/2021   CREATININE 0.60 09/26/2021   Lab Results  Component Value Date   MICRALBCREAT <30 09/26/2021    Lab Results  Component Value Date   CHOL 117 09/26/2021   HDL 45 (L) 09/26/2021   LDLCALC 57 09/26/2021   LDLDIRECT 113 (H) 03/19/2013   TRIG 73 09/26/2021    CHOLHDL 2.6 09/26/2021        ASSESSMENT / PLAN / RECOMMENDATIONS:   1) Type *** Diabetes Mellitus, ***controlled, With*** complications - Most recent A1c of *** %. Goal A1c < *** %.  ***  Plan: GENERAL: ***  MEDICATIONS: ***  EDUCATION / INSTRUCTIONS: BG monitoring instructions: Patient is instructed to check her blood sugars *** times a day, ***. Call [facility] if: BG persistently < 70 or > 300. I reviewed the Rule of 15 for the treatment of hypoglycemia in detail with the patient. Literature supplied.   2) Diabetic complications:  Eye: Does *** have known diabetic retinopathy.  Neuro/ Feet: Does *** have known diabetic peripheral neuropathy. Renal: Patient does *** have known baseline CKD. She is *** on an ACEI/ARB at present.Check urine albumin/creatinine ratio yearly starting at time of diagnosis. If albuminuria is positive, treatment is geared toward better glucose, blood pressure control and use of ACE inhibitors or ARBs. Monitor electrolytes and creatinine once to twice yearly.   3) Lipids: Patient is *** on a statin.    4) Hypertension: ***  at goal of < 140/90 mmHg.       Signed electronically by: Mack Guise, MD  [facility]  [facility] [address] [place], [place] [address] Phone: [phone] FAX: [phone]   CC: [name], [address] [address] [place] [address] Phone: [phone]  Fax: [phone]    Return to Endocrinology clinic as below: Future Appointments  Date Time Provider [place]  03/30/2022  3:15 PM Krista Adams, Melanie Crazier, MD [facility] None  05/29/2022  2:40 PM [name], FNP [facility] None  10/09/2022  3:20 PM [name], FNP [facility] None

## 2022-05-16 ENCOUNTER — Other Ambulatory Visit: Payer: Self-pay

## 2022-05-16 ENCOUNTER — Inpatient Hospital Stay (HOSPITAL_COMMUNITY)
Admission: AD | Admit: 2022-05-16 | Discharge: 2022-05-16 | Disposition: A | Discharge status: Home / Self Care | Payer: BC Managed Care – PPO | Attending: Obstetrics and Gynecology | Admitting: Obstetrics and Gynecology

## 2022-05-16 ENCOUNTER — Encounter (HOSPITAL_COMMUNITY): Payer: Self-pay | Admitting: Obstetrics and Gynecology

## 2022-05-16 DIAGNOSIS — Z3A24 24 weeks gestation of pregnancy: Secondary | ICD-10-CM | POA: Diagnosis not present

## 2022-05-16 DIAGNOSIS — O368120 Decreased fetal movements, second trimester, not applicable or unspecified: Secondary | ICD-10-CM | POA: Diagnosis not present

## 2022-05-16 DIAGNOSIS — O24112 Pre-existing diabetes mellitus, type 2, in pregnancy, second trimester: Secondary | ICD-10-CM | POA: Diagnosis not present

## 2022-05-16 DIAGNOSIS — Z794 Long term (current) use of insulin: Secondary | ICD-10-CM | POA: Insufficient documentation

## 2022-05-16 DIAGNOSIS — O26899 Other specified pregnancy related conditions, unspecified trimester: Secondary | ICD-10-CM

## 2022-05-16 DIAGNOSIS — R102 Pelvic and perineal pain: Secondary | ICD-10-CM | POA: Diagnosis present

## 2022-05-16 DIAGNOSIS — O10012 Pre-existing essential hypertension complicating pregnancy, second trimester: Secondary | ICD-10-CM | POA: Insufficient documentation

## 2022-05-16 DIAGNOSIS — O26892 Other specified pregnancy related conditions, second trimester: Secondary | ICD-10-CM | POA: Insufficient documentation

## 2022-05-16 DIAGNOSIS — Z87891 Personal history of nicotine dependence: Secondary | ICD-10-CM | POA: Insufficient documentation

## 2022-05-16 DIAGNOSIS — O09522 Supervision of elderly multigravida, second trimester: Secondary | ICD-10-CM | POA: Insufficient documentation

## 2022-05-16 LAB — URINALYSIS, ROUTINE W REFLEX MICROSCOPIC
Bilirubin Urine: NEGATIVE
Glucose, UA: NEGATIVE mg/dL
Hgb urine dipstick: NEGATIVE
Ketones, ur: NEGATIVE mg/dL
Nitrite: NEGATIVE
Protein, ur: NEGATIVE mg/dL
Specific Gravity, Urine: 1.023 (ref 1.005–1.030)
pH: 5 (ref 5.0–8.0)

## 2022-05-16 MED ORDER — ACETAMINOPHEN 500 MG PO TABS
1000.0000 mg | ORAL_TABLET | Freq: Once | ORAL | Status: AC
  Administered 2022-05-16: 1000 mg via ORAL
  Filled 2022-05-16: qty 2

## 2022-05-16 NOTE — MAU Note (Signed)
Krista Adams is a 36 y.o. at 18w0dhere in MAU reporting: intermittent abdominal pain on the right and left side since yesterday. Pain is cramping in nature. Also DFM today. States just overall not feeling well. No bleeding or LOF.  Onset of complaint: yesterday  Pain score: 5/10  Vitals:   05/16/22 1739  BP: (!) 144/66  Pulse: 60  Resp: 16  Temp: 98.4 F (36.9 C)  SpO2: 99%     FHT:142  Lab orders placed from triage: UA

## 2022-05-16 NOTE — MAU Provider Note (Signed)
History     CSN: [id number]  Arrival date and time: 05/16/22 1721   Event Date/Time   First Provider Initiated Contact with Patient 05/16/22 1803      Chief Complaint  Patient presents with   Abdominal Pain   Decreased Fetal Movement   HPI  Krista Adams is a 36 y.o. G1P0 at [redacted]w[redacted]d who presents for evaluation of bilateral pelvic pain. Patient reports it is worse when she's driving and walking. Patient rates the pain as a 6/10 and has not tried anything for the pain.   She denies any vaginal bleeding, discharge, and leaking of fluid. Denies any constipation, diarrhea or any urinary complaints.  She reports decreased fetal movement for the last 2 days. She reports since her arrival to MAU, the baby has started moving normally.   OB History     Gravida  1   Para      Term      Preterm      AB      Living         SAB      IAB      Ectopic      Multiple      Live Births              Past Medical History:  Diagnosis Date   Anovulatory (dysfunctional uterine) bleeding 03/13/2012   Anxiety    Depression    Depression with anxiety 03/18/2012   Diabetes mellitus without complication (HCC)    DM (diabetes mellitus), type 2, uncontrolled 05/06/2008   Qualifier: Diagnosis of  By: Drue Flirt  MD, Taineisha     Essential hypertension, benign 05/06/2008   Qualifier: Diagnosis of  By: Jamal Collin MD, Nathan     Morbid obesity ([place]) 05/06/2008   Qualifier: Diagnosis of  By: Jamal Collin MD, Ovid Curd      Past Surgical History:  Procedure Laterality Date   CHOLECYSTECTOMY      Family History  Problem Relation Age of Onset   Diabetes Mother    Hypertension Mother    Diabetes Maternal Aunt    Diabetes Maternal Grandmother    Hypertension Father     Social History   Tobacco Use   Smoking status: Former    Packs/day: 1.00    Types: Cigars, Cigarettes   Smokeless tobacco: Never   Tobacco comments:    smokes black and mild ; reports not smoking as much  Substance Use  Topics   Alcohol use: No   Drug use: Not Currently    Types: Marijuana    Allergies:  Allergies  Allergen Reactions   Pineapple     itching    Medications Prior to Admission  Medication Sig Dispense Refill Last Dose   ACCU-CHEK AVIVA PLUS test strip USE TO CHECK BLOOD SUGARS TWICE DAILY E11.69 100 strip 2 05/16/2022   blood glucose meter kit and supplies KIT Dispense based on patient and insurance preference. Use up to four times daily as directed. (FOR ICD-9 250.00, 250.01). 1 each 0 05/16/2022   insulin aspart (NOVOLOG) 100 UNIT/ML injection Inject into the skin 3 (three) times daily before meals.   05/16/2022   labetalol (NORMODYNE) 100 MG tablet TAKE 1 TABLET BY MOUTH EVERY DAY 90 tablet 1 05/16/2022   famotidine (PEPCID) 20 MG tablet Take 1 tablet (20 mg total) by mouth 2 (two) times daily. 30 tablet 2    ferrous sulfate 325 (65 FE) MG tablet TAKE 1 TABLET BY MOUTH EVERY DAY 90 tablet  1    metoCLOPramide (REGLAN) 10 MG tablet Take 1 tablet (10 mg total) by mouth every 6 (six) hours. 30 tablet 2    ondansetron (ZOFRAN) 4 MG tablet Take 1 tablet (4 mg total) by mouth every 8 (eight) hours as needed for nausea or vomiting. (Patient not taking: Reported on 01/30/2022) 30 tablet 2    promethazine (PHENERGAN) 25 MG tablet Take 1 tablet (25 mg total) by mouth every 6 (six) hours as needed for nausea or vomiting. (Patient not taking: Reported on 01/30/2022) 30 tablet 2    scopolamine (TRANSDERM-SCOP) 1 MG/3DAYS Place 1 patch (1.5 mg total) onto the skin every 3 (three) days. (Patient not taking: Reported on 01/30/2022) 10 patch 12     Review of Systems  Constitutional: Negative.  Negative for fatigue and fever.  HENT: Negative.    Respiratory: Negative.  Negative for shortness of breath.   Cardiovascular: Negative.  Negative for chest pain.  Gastrointestinal: Negative.  Negative for abdominal pain, constipation, diarrhea, nausea and vomiting.  Genitourinary:  Positive for pelvic pain. Negative  for dysuria, vaginal bleeding and vaginal discharge.  Neurological: Negative.  Negative for dizziness and headaches.   Physical Exam   Blood pressure (!) 135/45, pulse 72, temperature 98.4 F (36.9 C), temperature source Oral, resp. rate 16, height $RemoveBe'5\' 4"'zQHFZMtUd$  (1.626 m), weight 119.3 kg, SpO2 99 %.  Patient Vitals for the past 24 hrs:  BP Temp Temp src Pulse Resp SpO2 Height Weight  05/16/22 1945 (!) 135/45 -- -- 72 -- -- -- --  05/16/22 1901 (!) 141/59 -- -- 64 -- -- -- --  05/16/22 1846 (!) 140/56 -- -- 63 -- -- -- --  05/16/22 1831 (!) 134/57 -- -- 63 -- -- -- --  05/16/22 1757 (!) 148/62 -- -- 66 -- -- -- --  05/16/22 1739 (!) 144/66 98.4 F (36.9 C) Oral 60 16 99 % -- --  05/16/22 1732 -- -- -- -- -- -- $Rem'5\' 4"'IFbW$  (1.626 m) 119.3 kg    Physical Exam Vitals and nursing note reviewed.  Constitutional:      General: She is not in acute distress.    Appearance: She is well-developed.  HENT:     Head: Normocephalic.  Eyes:     Pupils: Pupils are equal, round, and reactive to light.  Cardiovascular:     Rate and Rhythm: Normal rate and regular rhythm.     Heart sounds: Normal heart sounds.  Pulmonary:     Effort: Pulmonary effort is normal. No respiratory distress.     Breath sounds: Normal breath sounds.  Abdominal:     General: Bowel sounds are normal. There is no distension.     Palpations: Abdomen is soft.     Tenderness: There is no abdominal tenderness.  Skin:    General: Skin is warm and dry.  Neurological:     Mental Status: She is alert and oriented to person, place, and time.  Psychiatric:        Mood and Affect: Mood normal.        Behavior: Behavior normal.        Thought Content: Thought content normal.        Judgment: Judgment normal.    Fetal Tracing:  Baseline: 130 Variability: moderate Accels: 10x10 Decels: variable x2, otherwise reassuring  Toco: none  Dilation: Closed Effacement (%): Thick Exam by:: Len Blalock, CNM   MAU Course   Procedures  Results for orders placed or performed during the hospital encounter  of 05/16/22 (from the past 24 hour(s))  Urinalysis, Routine w reflex microscopic Urine, Clean Catch     Status: Abnormal   Collection Time: 05/16/22  5:57 PM  Result Value Ref Range   Color, Urine YELLOW YELLOW   APPearance HAZY (A) CLEAR   Specific Gravity, Urine 1.023 1.005 - 1.030   pH 5.0 5.0 - 8.0   Glucose, UA NEGATIVE NEGATIVE mg/dL   Hgb urine dipstick NEGATIVE NEGATIVE   Bilirubin Urine NEGATIVE NEGATIVE   Ketones, ur NEGATIVE NEGATIVE mg/dL   Protein, ur NEGATIVE NEGATIVE mg/dL   Nitrite NEGATIVE NEGATIVE   Leukocytes,Ua LARGE (A) NEGATIVE   RBC / HPF 0-5 0 - 5 RBC/hpf   WBC, UA 21-50 0 - 5 WBC/hpf   Bacteria, UA RARE (A) NONE SEEN   Squamous Epithelial / LPF 0-5 0 - 5   Mucus PRESENT    Hyaline Casts, UA PRESENT    MDM Prenatal records from community office reviewed. Pregnancy complicated by [facility] on labetalol, type 2 DM Labs ordered and reviewed.   UA Tylenol PO- patient driving and unable to get ride. Discussed limitations in pain medication treatment.   Encouraged patient to use pregnancy support belt   Assessment and Plan   1. Pain of round ligament during pregnancy   2. [redacted] weeks gestation of pregnancy     -Discharge home in stable condition -Second trimester precautions discussed -Patient advised to follow-up with OB As scheduled for prenatal care -Patient may return to MAU as needed or if her condition were to change or worsen  Wende Mott, CNM 05/16/2022, 7:54 PM

## 2022-05-16 NOTE — Discharge Instructions (Signed)

## 2022-05-18 LAB — CULTURE, OB URINE

## 2022-05-29 ENCOUNTER — Ambulatory Visit: Payer: BC Managed Care – PPO | Admitting: Nurse Practitioner

## 2022-06-11 ENCOUNTER — Encounter: Payer: Self-pay | Admitting: Obstetrics & Gynecology

## 2022-06-11 ENCOUNTER — Ambulatory Visit (INDEPENDENT_AMBULATORY_CARE_PROVIDER_SITE_OTHER): Payer: BC Managed Care – PPO | Admitting: Obstetrics & Gynecology

## 2022-06-11 DIAGNOSIS — O0990 Supervision of high risk pregnancy, unspecified, unspecified trimester: Secondary | ICD-10-CM

## 2022-06-11 DIAGNOSIS — O24112 Pre-existing diabetes mellitus, type 2, in pregnancy, second trimester: Secondary | ICD-10-CM

## 2022-06-11 DIAGNOSIS — E119 Type 2 diabetes mellitus without complications: Secondary | ICD-10-CM | POA: Insufficient documentation

## 2022-06-11 DIAGNOSIS — I10 Essential (primary) hypertension: Secondary | ICD-10-CM

## 2022-06-11 DIAGNOSIS — O0992 Supervision of high risk pregnancy, unspecified, second trimester: Secondary | ICD-10-CM

## 2022-06-11 DIAGNOSIS — O24113 Pre-existing diabetes mellitus, type 2, in pregnancy, third trimester: Secondary | ICD-10-CM

## 2022-06-11 DIAGNOSIS — R011 Cardiac murmur, unspecified: Secondary | ICD-10-CM | POA: Insufficient documentation

## 2022-06-11 MED ORDER — PANTOPRAZOLE SODIUM 40 MG PO TBEC: 40.0000 mg | DELAYED_RELEASE_TABLET | Freq: Every day | ORAL | 1 refills | Status: DC

## 2022-06-11 NOTE — Progress Notes (Signed)
Transfer from [place] 27.[redacted] wks GA Hx DM 2 CBC, HIV, RPR today TDAP offered and considering, VIS given Depression and anxiety negative  Followed by endocrinologist for DM on insulin  Anatomy US and follow up USs at [facility]: next [place] 06/13/22 at [facility] then needs transfer to MFM  Fetal Echo done and WNL

## 2022-06-11 NOTE — Progress Notes (Unsigned)
  Subjective:Type 2 DM, transfer from [place] is a G1P0000 3w5dbeing seen today for her first obstetrical visit.  Her obstetrical history is significant for advanced maternal age, excessive weight gain, and DM and HTN . Patient does intend to breast feed. Pregnancy history fully reviewed.  Patient reports heartburn and nausea.  Vitals:   06/11/22 0920  BP: 126/82  Pulse: 73  Weight: 269 lb 14.4 oz (122.4 kg)    HISTORY: OB History  Gravida Para Term Preterm AB Living  1 0 0 0 0 0  SAB IAB Ectopic Multiple Live Births  0 0 0 0 0    # Outcome Date GA Lbr Len/2nd Weight Sex Delivery Anes PTL Lv  1 Current            Past Medical History:  Diagnosis Date   Anovulatory (dysfunctional uterine) bleeding 03/13/2012   Anxiety    Depression    Depression with anxiety 03/18/2012   Diabetes mellitus without complication (HCC)    DM (diabetes mellitus), type 2, uncontrolled 05/06/2008   Qualifier: Diagnosis of  By: BDrue Flirt MD, Taineisha     Essential hypertension, benign 05/06/2008   Qualifier: Diagnosis of  By: SJamal CollinMD, Nathan     Morbid obesity ([place] 05/06/2008   Qualifier: Diagnosis of  By: SJamal CollinMD, NOvid Curd    Past Surgical History:  Procedure Laterality Date   CHOLECYSTECTOMY     Family History  Problem Relation Age of Onset   Diabetes Mother    Hypertension Mother    Hypertension Father    Diabetes Maternal Aunt    Diabetes Paternal Uncle    Diabetes Maternal Grandmother      Exam    Uterus:   30 cm  Pelvic Exam:    Perineum:    Vulva:    Vagina:     pH:    Cervix:    Adnexa:    Bony Pelvis:   System: Breast:     Skin: normal coloration and turgor, no rashes    Neurologic: oriented, normal mood   Extremities: normal strength, tone, and muscle mass   HEENT PERRLA   Mouth/Teeth mucous membranes moist, pharynx normal without lesions and dental hygiene good   Neck supple   Cardiovascular: regular rate and rhythm   Respiratory:  appears  well, vitals normal, no respiratory distress, acyanotic, normal RR, ear and throat exam is normal   Abdomen: soft, non-tender; bowel sounds normal; no masses,  no organomegaly   Urinary:       Assessment:    Pregnancy: G1P0000 Patient Active Problem List   Diagnosis Date Noted   Diabetes mellitus ([place] 078/46/9629  Systolic murmur 052/84/1324  Supervision of high risk pregnancy, antepartum 06/11/2022   Hypertension affecting pregnancy 02/06/2022   Elderly primigravida 02/06/2022   Pre-existing type 2 diabetes mellitus in pregnancy 02/06/2022   Depression with anxiety 03/18/2012   DM (diabetes mellitus), type 2, uncontrolled 05/06/2008   Morbid obesity ([place] 05/06/2008   Essential hypertension 05/06/2008        Plan:     Initial labs abstracted Problem list reviewed and updated. Genetic Screening discussed : results reviewed.  Ultrasound discussed; fetal survey: results reviewed.  Follow up in 2 weeks. 50% of 30 min visit spent on counseling and coordination of care.  F/u UK[place]are scheduled. Recommend increase Lantus to 40 U HS for FBS 100-120. She is followed by endocrine   JEmeterio Reeve7/09/2022

## 2022-06-20 ENCOUNTER — Telehealth: Payer: Self-pay | Admitting: Emergency Medicine

## 2022-06-20 NOTE — Telephone Encounter (Signed)
RC to patient from nurse voicemail. Pt will be transferring from [facility] and wants to know when she will get an [place].  This RN told patient [place] can be scheduled at her next apt on 7/25. Pt verbalized understanding.

## 2022-06-26 ENCOUNTER — Ambulatory Visit (INDEPENDENT_AMBULATORY_CARE_PROVIDER_SITE_OTHER): Payer: BC Managed Care – PPO | Admitting: Family Medicine

## 2022-06-26 VITALS — BP 143/76 | HR 67 | Wt 279.4 lb

## 2022-06-26 DIAGNOSIS — Z3A29 29 weeks gestation of pregnancy: Secondary | ICD-10-CM

## 2022-06-26 DIAGNOSIS — I10 Essential (primary) hypertension: Secondary | ICD-10-CM

## 2022-06-26 DIAGNOSIS — O24113 Pre-existing diabetes mellitus, type 2, in pregnancy, third trimester: Secondary | ICD-10-CM

## 2022-06-26 DIAGNOSIS — O0993 Supervision of high risk pregnancy, unspecified, third trimester: Secondary | ICD-10-CM

## 2022-06-26 DIAGNOSIS — O163 Unspecified maternal hypertension, third trimester: Secondary | ICD-10-CM

## 2022-06-26 DIAGNOSIS — Z23 Encounter for immunization: Secondary | ICD-10-CM

## 2022-06-26 DIAGNOSIS — O0990 Supervision of high risk pregnancy, unspecified, unspecified trimester: Secondary | ICD-10-CM

## 2022-06-26 MED ORDER — INSULIN GLARGINE 100 UNIT/ML ~~LOC~~ SOLN: 60.0000 [IU] | Freq: Two times a day (BID) | SUBCUTANEOUS | 11 refills | Status: DC

## 2022-06-26 NOTE — Progress Notes (Signed)
   PRENATAL VISIT NOTE  Subjective:  Krista Adams is a 36 y.o. G1P0000 at 77w6dbeing seen today for ongoing prenatal care.  She is currently monitored for the following issues for this high-risk pregnancy and has DM (diabetes mellitus), type 2, uncontrolled; Morbid obesity ([place]; Essential hypertension; Depression with anxiety; Hypertension affecting pregnancy; Diabetes mellitus ([place]; Elderly primigravida; Pre-existing type 2 diabetes mellitus in pregnancy; Systolic murmur; and Supervision of high risk pregnancy, antepartum on their problem list.  Patient reports no complaints.  Contractions: Not present. Vag. Bleeding: None.  Movement: Present. Denies leaking of fluid.   The following portions of the patient's history were reviewed and updated as appropriate: allergies, current medications, past family history, past medical history, past social history, past surgical history and problem list.   Objective:   Vitals:   06/26/22 1603 06/26/22 1608  BP: (!) 160/73 (!) 143/76  Pulse: 62 67  Weight: 279 lb 6.4 oz (126.7 kg)     Fetal Status: Fetal Heart Rate (bpm): 135   Movement: Present     General:  Alert, oriented and cooperative. Patient is in no acute distress.  Skin: Skin is warm and dry. No rash noted.   Cardiovascular: Normal heart rate noted  Respiratory: Normal respiratory effort, no problems with respiration noted  Abdomen: Soft, gravid, appropriate for gestational age.  Pain/Pressure: Absent     Pelvic: Cervical exam deferred        Extremities: Normal range of motion.  Edema: Trace  Mental Status: Normal mood and affect. Normal behavior. Normal judgment and thought content.   Assessment and Plan:  Pregnancy: G1P0000 at 244w6d. Supervision of high risk pregnancy, antepartum FHT normal CBC, HIV, RPR today - CBC - HIV Antibody (routine testing w rflx) - RPR  2. Pre-existing type 2 diabetes mellitus during pregnancy in third trimester Sees endocrinologist. Taking  Lantus 65u in AM and 60u in PM Novolog SS Will schedule for USKoreaBGs low 100s for fasting and 130s for 2hr PP.  Saw endo today and bumped up her insulin.  3. Essential hypertension BP slightly elevated  4. Morbid obesity (HCEar[place] Preterm labor symptoms and general obstetric precautions including but not limited to vaginal bleeding, contractions, leaking of fluid and fetal movement were reviewed in detail with the patient. Please refer to After Visit Summary for other counseling recommendations.   No follow-ups on file.  Future Appointments  Date Time Provider De[place]8/07/2022  4:00 PM MoMinette BrineFNP [facility] None  07/24/2022  3:30 PM Constant, PeVickii ChafeMD CWSpoffordone  10/09/2022  3:20 PM MoMinette BrineFNP [facility] None    JaTruett MainlandDO

## 2022-06-26 NOTE — Progress Notes (Signed)
Patient presents for ROB. Patient has no concerns today. 

## 2022-06-27 LAB — CBC
Hematocrit: 34.7 % (ref 34.0–46.6)
Hemoglobin: 11.1 g/dL (ref 11.1–15.9)
MCH: 29 pg (ref 26.6–33.0)
MCHC: 32 g/dL (ref 31.5–35.7)
MCV: 91 fL (ref 79–97)
Platelets: 235 10*3/uL (ref 150–450)
RBC: 3.83 x10E6/uL (ref 3.77–5.28)
RDW: 12.6 % (ref 11.7–15.4)
WBC: 5.8 10*3/uL (ref 3.4–10.8)

## 2022-06-27 LAB — HIV ANTIBODY (ROUTINE TESTING W REFLEX): HIV Screen 4th Generation wRfx: NONREACTIVE

## 2022-06-27 LAB — RPR: RPR Ser Ql: NONREACTIVE

## 2022-07-06 ENCOUNTER — Other Ambulatory Visit: Payer: Self-pay | Admitting: Obstetrics & Gynecology

## 2022-07-06 DIAGNOSIS — O0990 Supervision of high risk pregnancy, unspecified, unspecified trimester: Secondary | ICD-10-CM

## 2022-07-10 ENCOUNTER — Encounter: Payer: Self-pay | Admitting: Nurse Practitioner

## 2022-07-10 ENCOUNTER — Ambulatory Visit: Payer: BC Managed Care – PPO | Admitting: Nurse Practitioner

## 2022-07-10 ENCOUNTER — Encounter: Payer: BC Managed Care – PPO | Admitting: Obstetrics & Gynecology

## 2022-07-10 VITALS — BP 122/62 | HR 60 | Temp 98.5°F | Ht 64.0 in | Wt 280.0 lb

## 2022-07-10 DIAGNOSIS — Z3490 Encounter for supervision of normal pregnancy, unspecified, unspecified trimester: Secondary | ICD-10-CM

## 2022-07-10 DIAGNOSIS — E1169 Type 2 diabetes mellitus with other specified complication: Secondary | ICD-10-CM

## 2022-07-10 DIAGNOSIS — Z6841 Body Mass Index (BMI) 40.0 and over, adult: Secondary | ICD-10-CM | POA: Diagnosis not present

## 2022-07-10 DIAGNOSIS — I10 Essential (primary) hypertension: Secondary | ICD-10-CM | POA: Diagnosis not present

## 2022-07-10 NOTE — Progress Notes (Signed)
I,[name],acting as a Education administrator for [employer], FNP.,have documented all relevant documentation on the behalf of [name], FNP,as directed by  [name], FNP while in the presence of [name], [place].  Subjective:     Patient ID: Krista Adams , female    DOB: 1986-10-27 , 36 y.o.   MRN: [id number]   Chief Complaint  Patient presents with   Hypertension    HPI  BP & DM F/U. Patient reports expecting a baby. She is due October 4th. She is seeing endocrinology for her diabetes at this time she is on Lantus 65 units am and 60 units pm.   Hypertension This is a chronic problem. The current episode started more than 1 year ago. The problem is uncontrolled.     Past Medical History:  Diagnosis Date   Anovulatory (dysfunctional uterine) bleeding 03/13/2012   Anxiety    Depression    Depression with anxiety 03/18/2012   Diabetes mellitus without complication (HCC)    DM (diabetes mellitus), type 2, uncontrolled 05/06/2008   Qualifier: Diagnosis of  By: Drue Flirt  MD, Taineisha     Essential hypertension, benign 05/06/2008   Qualifier: Diagnosis of  By: Jamal Collin MD, Nathan     Morbid obesity ([place]) 05/06/2008   Qualifier: Diagnosis of  By: Jamal Collin MD, Ovid Curd       Family History  Problem Relation Age of Onset   Diabetes Mother    Hypertension Mother    Hypertension Father    Asthma Brother    Asthma Maternal Aunt    Diabetes Maternal Aunt    Cancer Paternal Aunt    Cancer Paternal Uncle    Diabetes Paternal Uncle    Stroke Maternal Grandmother    Diabetes Maternal Grandmother    Cancer Paternal Grandmother    Asthma Other    Birth defects Neg Hx    Heart disease Neg Hx      Current Outpatient Medications:    insulin aspart (NOVOLOG) 100 UNIT/ML injection, Inject into the skin 3 (three) times daily before meals., Disp: , Rfl:    Insulin Glargine (BASAGLAR KWIKPEN [place]), Inject into the skin. 65units in the morning and 60units at night, Disp: , Rfl:    labetalol  (NORMODYNE) 100 MG tablet, TAKE 1 TABLET BY MOUTH EVERY DAY, Disp: 90 tablet, Rfl: 1   metoCLOPramide (REGLAN) 10 MG tablet, Take 1 tablet (10 mg total) by mouth every 6 (six) hours., Disp: 30 tablet, Rfl: 2   pantoprazole (PROTONIX) 40 MG tablet, Take 1 tablet (40 mg total) by mouth daily., Disp: 30 tablet, Rfl: 1   promethazine (PHENERGAN) 25 MG tablet, Take 1 tablet (25 mg total) by mouth every 6 (six) hours as needed for nausea or vomiting., Disp: 30 tablet, Rfl: 2   ACCU-CHEK AVIVA PLUS test strip, USE TO CHECK BLOOD SUGARS TWICE DAILY E11.69, Disp: 100 strip, Rfl: 2   blood glucose meter kit and supplies KIT, Dispense based on patient and insurance preference. Use up to four times daily as directed. (FOR ICD-9 250.00, 250.01)., Disp: 1 each, Rfl: 0   famotidine (PEPCID) 20 MG tablet, Take 1 tablet (20 mg total) by mouth 2 (two) times daily., Disp: 30 tablet, Rfl: 2   Allergies  Allergen Reactions   Pineapple     itching     Review of Systems  Constitutional: Negative.   Respiratory: Negative.    Cardiovascular: Negative.   Gastrointestinal: Negative.   Neurological: Negative.   Psychiatric/Behavioral: Negative.  Today's Vitals   07/10/22 1615  BP: 122/62  Pulse: 60  Temp: 98.5 F (36.9 C)  TempSrc: Oral  Weight: 280 lb (127 kg)  Height: 5' 4" (1.626 m)   Body mass index is 48.06 kg/m.  Wt Readings from Last 3 Encounters:  07/10/22 280 lb (127 kg)  06/26/22 279 lb 6.4 oz (126.7 kg)  06/11/22 269 lb 14.4 oz (122.4 kg)    Objective:  Physical Exam Vitals reviewed.  Constitutional:      General: She is not in acute distress.    Appearance: Normal appearance. She is obese.  Cardiovascular:     Rate and Rhythm: Normal rate and regular rhythm.     Pulses: Normal pulses.     Heart sounds: Normal heart sounds. No murmur heard. Pulmonary:     Effort: Pulmonary effort is normal. No respiratory distress.     Breath sounds: Normal breath sounds. No wheezing.   Neurological:     General: No focal deficit present.     Mental Status: She is alert and oriented to person, place, and time.     Cranial Nerves: No cranial nerve deficit.     Motor: No weakness.  Psychiatric:        Mood and Affect: Mood normal.        Behavior: Behavior normal.        Thought Content: Thought content normal.        Judgment: Judgment normal.         Assessment And Plan:     1. Essential hypertension, benign Comments: Blood pressure is well controlled. Continue current medications.   2. Diabetes mellitus type 2 in obese [facility]) Comments: She is now on insulin due to being pregnant being managed by OB  3. Morbid obesity with BMI of 45.0-49.9, adult ([place])  4. Pregnancy, unspecified gestational age Comments: Continue follow up with High Risk OB provider   Patient was given opportunity to ask questions. Patient verbalized understanding of the plan and was able to repeat key elements of the plan. All questions were answered to their satisfaction.  [name], FNP   I, [name], FNP, have reviewed all documentation for this visit. The documentation on 07/10/22 for the exam, diagnosis, procedures, and orders are all accurate and complete.   IF YOU HAVE BEEN REFERRED TO A SPECIALIST, IT MAY TAKE 1-2 WEEKS TO SCHEDULE/PROCESS THE REFERRAL. IF YOU HAVE NOT HEARD FROM US/SPECIALIST IN TWO WEEKS, PLEASE GIVE [place] A CALL AT [phone].   THE PATIENT IS ENCOURAGED TO PRACTICE SOCIAL DISTANCING DUE TO THE COVID-19 PANDEMIC.

## 2022-07-10 NOTE — Patient Instructions (Signed)

## 2022-07-12 ENCOUNTER — Encounter: Payer: BC Managed Care – PPO | Admitting: Obstetrics & Gynecology

## 2022-07-18 ENCOUNTER — Ambulatory Visit: Payer: BC Managed Care – PPO | Attending: Family Medicine

## 2022-07-18 ENCOUNTER — Ambulatory Visit: Payer: BC Managed Care – PPO | Admitting: *Deleted

## 2022-07-18 ENCOUNTER — Other Ambulatory Visit: Payer: Self-pay | Admitting: *Deleted

## 2022-07-18 ENCOUNTER — Other Ambulatory Visit: Payer: Self-pay | Admitting: Family Medicine

## 2022-07-18 VITALS — BP 142/52 | HR 64

## 2022-07-18 DIAGNOSIS — O0990 Supervision of high risk pregnancy, unspecified, unspecified trimester: Secondary | ICD-10-CM

## 2022-07-18 DIAGNOSIS — I10 Essential (primary) hypertension: Secondary | ICD-10-CM

## 2022-07-18 DIAGNOSIS — O24113 Pre-existing diabetes mellitus, type 2, in pregnancy, third trimester: Secondary | ICD-10-CM

## 2022-07-18 DIAGNOSIS — O10913 Unspecified pre-existing hypertension complicating pregnancy, third trimester: Secondary | ICD-10-CM

## 2022-07-18 DIAGNOSIS — O09523 Supervision of elderly multigravida, third trimester: Secondary | ICD-10-CM

## 2022-07-18 DIAGNOSIS — O99213 Obesity complicating pregnancy, third trimester: Secondary | ICD-10-CM

## 2022-07-24 ENCOUNTER — Encounter: Payer: BC Managed Care – PPO | Admitting: Obstetrics and Gynecology

## 2022-07-24 ENCOUNTER — Inpatient Hospital Stay (HOSPITAL_COMMUNITY)
Admission: AD | Admit: 2022-07-24 | Discharge: 2022-07-24 | Disposition: A | Discharge status: Home / Self Care | Payer: BC Managed Care – PPO | Attending: Obstetrics and Gynecology | Admitting: Obstetrics and Gynecology

## 2022-07-24 ENCOUNTER — Encounter (HOSPITAL_COMMUNITY): Payer: Self-pay | Admitting: Obstetrics and Gynecology

## 2022-07-24 ENCOUNTER — Other Ambulatory Visit: Payer: Self-pay | Admitting: *Deleted

## 2022-07-24 ENCOUNTER — Ambulatory Visit (HOSPITAL_BASED_OUTPATIENT_CLINIC_OR_DEPARTMENT_OTHER): Payer: BC Managed Care – PPO

## 2022-07-24 ENCOUNTER — Ambulatory Visit: Payer: BC Managed Care – PPO | Admitting: *Deleted

## 2022-07-24 ENCOUNTER — Ambulatory Visit (HOSPITAL_BASED_OUTPATIENT_CLINIC_OR_DEPARTMENT_OTHER): Payer: BC Managed Care – PPO | Admitting: Maternal & Fetal Medicine

## 2022-07-24 VITALS — BP 160/72

## 2022-07-24 VITALS — BP 162/76 | HR 63

## 2022-07-24 DIAGNOSIS — O10013 Pre-existing essential hypertension complicating pregnancy, third trimester: Secondary | ICD-10-CM

## 2022-07-24 DIAGNOSIS — O24113 Pre-existing diabetes mellitus, type 2, in pregnancy, third trimester: Secondary | ICD-10-CM | POA: Insufficient documentation

## 2022-07-24 DIAGNOSIS — O09523 Supervision of elderly multigravida, third trimester: Secondary | ICD-10-CM

## 2022-07-24 DIAGNOSIS — Z3A33 33 weeks gestation of pregnancy: Secondary | ICD-10-CM | POA: Insufficient documentation

## 2022-07-24 DIAGNOSIS — Z6835 Body mass index (BMI) 35.0-35.9, adult: Secondary | ICD-10-CM

## 2022-07-24 DIAGNOSIS — Z79899 Other long term (current) drug therapy: Secondary | ICD-10-CM | POA: Insufficient documentation

## 2022-07-24 DIAGNOSIS — O0990 Supervision of high risk pregnancy, unspecified, unspecified trimester: Secondary | ICD-10-CM | POA: Diagnosis not present

## 2022-07-24 DIAGNOSIS — E119 Type 2 diabetes mellitus without complications: Secondary | ICD-10-CM | POA: Diagnosis not present

## 2022-07-24 DIAGNOSIS — O99213 Obesity complicating pregnancy, third trimester: Secondary | ICD-10-CM | POA: Insufficient documentation

## 2022-07-24 DIAGNOSIS — I10 Essential (primary) hypertension: Secondary | ICD-10-CM

## 2022-07-24 DIAGNOSIS — E669 Obesity, unspecified: Secondary | ICD-10-CM

## 2022-07-24 DIAGNOSIS — O10913 Unspecified pre-existing hypertension complicating pregnancy, third trimester: Secondary | ICD-10-CM

## 2022-07-24 DIAGNOSIS — Z794 Long term (current) use of insulin: Secondary | ICD-10-CM | POA: Insufficient documentation

## 2022-07-24 DIAGNOSIS — O10919 Unspecified pre-existing hypertension complicating pregnancy, unspecified trimester: Secondary | ICD-10-CM

## 2022-07-24 LAB — CBC
HCT: 32.6 % — ABNORMAL LOW (ref 36.0–46.0)
Hemoglobin: 10.8 g/dL — ABNORMAL LOW (ref 12.0–15.0)
MCH: 29 pg (ref 26.0–34.0)
MCHC: 33.1 g/dL (ref 30.0–36.0)
MCV: 87.4 fL (ref 80.0–100.0)
Platelets: 229 10*3/uL (ref 150–400)
RBC: 3.73 MIL/uL — ABNORMAL LOW (ref 3.87–5.11)
RDW: 13 % (ref 11.5–15.5)
WBC: 5 10*3/uL (ref 4.0–10.5)
nRBC: 0 % (ref 0.0–0.2)

## 2022-07-24 LAB — PROTEIN / CREATININE RATIO, URINE
Creatinine, Urine: 68 mg/dL
Protein Creatinine Ratio: 0.09 mg/mg{Cre} (ref 0.00–0.15)
Total Protein, Urine: 6 mg/dL

## 2022-07-24 LAB — COMPREHENSIVE METABOLIC PANEL
ALT: 10 U/L (ref 0–44)
AST: 17 U/L (ref 15–41)
Albumin: 2.6 g/dL — ABNORMAL LOW (ref 3.5–5.0)
Alkaline Phosphatase: 73 U/L (ref 38–126)
Anion gap: 6 (ref 5–15)
BUN: 9 mg/dL (ref 6–20)
CO2: 20 mmol/L — ABNORMAL LOW (ref 22–32)
Calcium: 8.8 mg/dL — ABNORMAL LOW (ref 8.9–10.3)
Chloride: 109 mmol/L (ref 98–111)
Creatinine, Ser: 0.63 mg/dL (ref 0.44–1.00)
GFR, Estimated: >60 mL/min (ref >60)
Glucose, Bld: 123 mg/dL — ABNORMAL HIGH (ref 70–99)
Potassium: 3.9 mmol/L (ref 3.5–5.1)
Sodium: 135 mmol/L (ref 135–145)
Total Bilirubin: 0.3 mg/dL (ref 0.3–1.2)
Total Protein: 6.2 g/dL — ABNORMAL LOW (ref 6.5–8.1)

## 2022-07-24 MED ORDER — LABETALOL HCL 5 MG/ML IV SOLN
40.0000 mg | INTRAVENOUS | Status: DC | PRN
  Administered 2022-07-24: 40 mg via INTRAVENOUS
  Filled 2022-07-24: qty 8

## 2022-07-24 MED ORDER — NIFEDIPINE 10 MG PO CAPS
20.0000 mg | ORAL_CAPSULE | ORAL | Status: DC | PRN
  Administered 2022-07-24: 20 mg via ORAL
  Filled 2022-07-24: qty 2

## 2022-07-24 MED ORDER — LABETALOL HCL 100 MG PO TABS: 300.0000 mg | ORAL_TABLET | Freq: Two times a day (BID) | ORAL | 1 refills | Status: DC

## 2022-07-24 MED ORDER — NIFEDIPINE 10 MG PO CAPS
10.0000 mg | ORAL_CAPSULE | ORAL | Status: DC | PRN
  Administered 2022-07-24: 10 mg via ORAL
  Filled 2022-07-24 (×2): qty 1

## 2022-07-24 NOTE — MAU Provider Note (Signed)
History     [id number]  Arrival date and time: 07/24/22 1536    Chief Complaint  Patient presents with   Hypertension     HPI Krista Adams is a 36 y.o. at 14w6dwho presents from MFM for BP evaluation. Hx significant for preexisting diabetes on insulin & chronic hypertension on labetalol 100 BID.  Was seen in MFM today - had BPP 10/10. BPs were severe range during her visit.  She denies headache, visual disturbance or epigastric pain.  Denies contractions, LOF, or vaginal bleeding. Good fetal movement.  Took labetalol this morning.   OB History     Gravida  1   Para  0   Term  0   Preterm  0   AB  0   Living  0      SAB  0   IAB  0   Ectopic  0   Multiple  0   Live Births  0           Past Medical History:  Diagnosis Date   Anovulatory (dysfunctional uterine) bleeding 03/13/2012   Depression with anxiety 03/18/2012   DM (diabetes mellitus), type 2, uncontrolled 05/06/2008   Qualifier: Diagnosis of  By: BDrue Flirt MD, Taineisha     Essential hypertension, benign 05/06/2008   Qualifier: Diagnosis of  By: SJamal CollinMD, Nathan     Morbid obesity ([place] 05/06/2008   Qualifier: Diagnosis of  By: SJamal CollinMD, NOvid Curd     Past Surgical History:  Procedure Laterality Date   CHOLECYSTECTOMY      Family History  Problem Relation Age of Onset   Diabetes Mother    Hypertension Mother    Hypertension Father    Asthma Brother    Asthma Maternal Aunt    Diabetes Maternal Aunt    Cancer Paternal Aunt    Cancer Paternal Uncle    Diabetes Paternal Uncle    Stroke Maternal Grandmother    Diabetes Maternal Grandmother    Cancer Paternal Grandmother    Asthma Other    Birth defects Neg Hx    Heart disease Neg Hx     Allergies  Allergen Reactions   Pineapple     itching    No current facility-administered medications on file prior to encounter.   Current Outpatient Medications on File Prior to Encounter  Medication Sig Dispense Refill   ACCU-CHEK  AVIVA PLUS test strip USE TO CHECK BLOOD SUGARS TWICE DAILY E11.69 100 strip 2   blood glucose meter kit and supplies KIT Dispense based on patient and insurance preference. Use up to four times daily as directed. (FOR ICD-9 250.00, 250.01). 1 each 0   famotidine (PEPCID) 20 MG tablet Take 1 tablet (20 mg total) by mouth 2 (two) times daily. 30 tablet 2   insulin aspart (NOVOLOG) 100 UNIT/ML injection Inject into the skin 3 (three) times daily before meals.     Insulin Glargine (BASAGLAR KWIKPEN [place]) Inject into the skin. 65units in the morning and 60units at night     metoCLOPramide (REGLAN) 10 MG tablet Take 1 tablet (10 mg total) by mouth every 6 (six) hours. 30 tablet 2   pantoprazole (PROTONIX) 40 MG tablet Take 1 tablet (40 mg total) by mouth daily. 30 tablet 1   promethazine (PHENERGAN) 25 MG tablet Take 1 tablet (25 mg total) by mouth every 6 (six) hours as needed for nausea or vomiting. 30 tablet 2   [DISCONTINUED] Liraglutide (VICTOZA) 18 MG/3ML SOLN Inject 0.3  mLs (1.8 mg total) into the skin daily. 6 mL 6     ROS Pertinent positives and negative per HPI, all others reviewed and negative  Physical Exam   BP (!) 144/66   Pulse 68   Temp 98.1 F (36.7 C) (Oral)   Resp 18   Ht _0  (1.626 m)   Wt 131.7 kg   LMP  (LMP Unknown)   SpO2 97%   BMI 49.83 kg/m   Patient Vitals for the past 24 hrs:  BP Temp Temp src Pulse Resp SpO2 Height Weight  07/24/22 1901 (!) 144/66 -- -- 68 -- 97 % -- --  07/24/22 1821 (!) 162/80 -- -- 73 -- -- -- --  07/24/22 1801 (!) 167/78 -- -- 71 -- 99 % -- --  07/24/22 1747 (!) 167/79 -- -- -- -- -- -- --  07/24/22 1734 (!) 161/82 -- -- 67 -- -- -- --  07/24/22 1711 (!) 175/77 -- -- 66 -- 99 % -- --  07/24/22 1701 (!) 181/73 -- -- 64 -- -- -- --  07/24/22 1646 (!) 174/75 -- -- 68 -- 99 % -- --  07/24/22 1636 (!) 159/74 -- -- 63 -- 99 % -- --  07/24/22 1621 136/63 -- -- 66 -- 99 % -- --  07/24/22 1557 (!) 168/71 98.1 F (36.7 C) Oral 61 18 97 % 5'  4" (1.626 m) 131.7 kg    Physical Exam Vitals and nursing note reviewed.  Constitutional:      General: She is not in acute distress.    Appearance: Normal appearance.  HENT:     Head: Normocephalic and atraumatic.  Pulmonary:     Effort: Pulmonary effort is normal. No respiratory distress.  Skin:    General: Skin is warm and dry.  Neurological:     Mental Status: She is alert.     Deep Tendon Reflexes: Reflexes normal.  Psychiatric:        Mood and Affect: Mood normal.        Behavior: Behavior normal.       FHT Baseline 135, moderate variability, 15x15 accels, no decels Toco: none Cat: 1  Labs Results for orders placed or performed during the hospital encounter of 07/24/22 (from the past 24 hour(s))  Comprehensive metabolic panel     Status: Abnormal   Collection Time: 07/24/22  4:14 PM  Result Value Ref Range   Sodium 135 135 - 145 mmol/L   Potassium 3.9 3.5 - 5.1 mmol/L   Chloride 109 98 - 111 mmol/L   CO2 20 (L) 22 - 32 mmol/L   Glucose, Bld 123 (H) 70 - 99 mg/dL   BUN 9 6 - 20 mg/dL   Creatinine, Ser 0.63 0.44 - 1.00 mg/dL   Calcium 8.8 (L) 8.9 - 10.3 mg/dL   Total Protein 6.2 (L) 6.5 - 8.1 g/dL   Albumin 2.6 (L) 3.5 - 5.0 g/dL   AST 17 15 - 41 U/L   ALT 10 0 - 44 U/L   Alkaline Phosphatase 73 38 - 126 U/L   Total Bilirubin 0.3 0.3 - 1.2 mg/dL   GFR, Estimated >60 >60 mL/min   Anion gap 6 5 - 15  CBC     Status: Abnormal   Collection Time: 07/24/22  4:14 PM  Result Value Ref Range   WBC 5.0 4.0 - 10.5 K/uL   RBC 3.73 (L) 3.87 - 5.11 MIL/uL   Hemoglobin 10.8 (L) 12.0 - 15.0 g/dL   HCT  32.6 (L) 36.0 - 46.0 %   MCV 87.4 80.0 - 100.0 fL   MCH 29.0 26.0 - 34.0 pg   MCHC 33.1 30.0 - 36.0 g/dL   RDW 13.0 11.5 - 15.5 %   Platelets 229 150 - 400 K/uL   nRBC 0.0 0.0 - 0.2 %  Protein / creatinine ratio, urine     Status: None   Collection Time: 07/24/22  4:29 PM  Result Value Ref Range   Creatinine, Urine 68 mg/dL   Total Protein, Urine 6 mg/dL   Protein  Creatinine Ratio 0.09 0.00 - 0.15 mg/mg[Cre]    Imaging [place] MFM FETAL BPP W/NONSTRESS  Result Date: 07/24/2022 ----------------------------------------------------------------------  OBSTETRICS REPORT                       (Signed Final 07/24/2022 07:03 pm) ---------------------------------------------------------------------- Patient Info  ID #:       [id number]                          D.O.B.:  1986/04/01 (36 yrs)  Name:       Krista Adams                Visit Date: 07/24/2022 01:51 pm ---------------------------------------------------------------------- Performed By  Attending:        Valeda Malm, DO      Ref. Address:     [place]                                                             [place]  Performed By:     Rodrigo Ran BS      [facility]  Referred By:      [facility] ---------------------------------------------------------------------- Orders  #  Description                           Code        Ordered By  1  [place] MFM FETAL BPP                      57846.9     RAVI [facility]     W/NONSTRESS ----------------------------------------------------------------------  #  Order #                     Accession #                Episode #  1  [id number]                   [id number]                 [id number] ---------------------------------------------------------------------- Indications  [redacted] weeks gestation of pregnancy                Z3A.33  Pre-existing diabetes, type 2, in pregnancy,   O24.113  third trimester  Advanced maternal age multigravida 45+,         O57.523  third trimester (41 yrs)  Obesity complicating pregnancy, third          O99.213  trimester (BMI 35.7)  [redacted] weeks gestation of pregnancy                Z3A.33  Encounter for other antenatal screening        Z36.2  follow-up  Hypertension - Chronic/Pre-existing            O10.019  (Labetalol) ---------------------------------------------------------------------- Fetal Evaluation  Num Of Fetuses:         1  Fetal Heart Rate(bpm):  145  Cardiac Activity:       Observed  Presentation:           Cephalic  Placenta:               Posterior  Amniotic Fluid  AFI FV:      Within normal limits ---------------------------------------------------------------------- Biophysical Evaluation  Amniotic F.V:   Within normal limits       F. Tone:        Observed  F. Movement:    Observed                   N.S.T:          Reactive  F. Breathing:   Observed                   Score:          10/10 ---------------------------------------------------------------------- OB History  Gravidity:    1 ---------------------------------------------------------------------- Gestational Age  Best:          33w 6d     Det. ByLoman Chroman         EDD:   09/05/22                                      (  01/11/22) ---------------------------------------------------------------------- Anatomy  Stomach:               Appears normal, left   Bladder:                Appears normal                         sided  Kidneys:               Appear normal ---------------------------------------------------------------------- Comments  Ms. Kloos is a G1 at  71w 6d with EDD of 09/05/2022 dated  by Early Ultrasound  (01/11/22) here for a follow-up growth  ultrasound for [facility] and DM2.  Sonographic findings  Single intrauterine pregnancy at at  33w 6d  Observed fetal cardiac activity.  Cephalic presentation.  Interval fetal anatomy appears normal.  Amniotic fluid volume: Within normal limits.  Placenta is posterior.  BPP 10/10.  Due to severe range BP a  consult was performed and the  patient sent to the MAU for assessment. See Epic note for  details.  Counseling  I discussed my concern for exacerbation of chronic  hypertension versus superimposed preeclampsia.  I  discussed the maternal and fetal implications of the range  blood pressures including stroke, myocardial infarction,  placental abruption and maternal and fetal death.  I also  discussed my concern for her needing blood pressure  medication adjustment given her increase in systolic blood  pressure and pulse of 60.  We briefly discussed her blood  sugars which seem well controlled overall however she does  report lower blood glucose than usual.  We discussed this  could be signs of placental insufficiency although this is not  technically high to make the diagnosis. I discussed delivery at  37 weeks if this is a exacerbation of chronic hypertension or  superimposed preeclampsia without severe features.  However, if she develops super imposed preeclampsia with  severe features consider delivery at [redacted] weeks gestation.  We  also discussed the possibility that this could simply be blood  pressure cuff error or random elevation of blood pressure due  to stress.  The patient reports that she has had a stressful  day and this could be because of her underlying blood  pressure.  Vitals  Blood pressure: 166/68, repeat 162/76, Pulse: 60,  Prepregnancy BMI: 35  Genetic testing: Did not inquire  Recommendations  1. Chronic hypertension on labetalol  2. Type 2 diabetes on insulin  3. Advanced maternal age  41. Maternal obesity  -Due to the patient's severe systolic blood pressure I  recommend she go to the MAU for evaluation.  I called the  MAU provider on call to notify her regarding the patient.  -Consider discontinuing labetalol in favor of nifedipine given  her elevated systolic blood pressure and low pulse.  -Ultimately defer management to the MAU provider after  assessment.  -If discharged recommend twice weekly  NST and weekly AFI  or weekly BPP until delivery around 37w or sooner if indicate ----------------------------------------------------------------------                  Valeda Malm, DO Electronically Signed Final Report   07/24/2022 07:03 pm ----------------------------------------------------------------------   MAU Course  Procedures Lab Orders         Comprehensive metabolic panel         CBC         Protein / creatinine ratio, urine     Meds ordered this  encounter  Medications   AND Linked Order Group    NIFEdipine (PROCARDIA) capsule 10 mg    NIFEdipine (PROCARDIA) capsule 20 mg    NIFEdipine (PROCARDIA) capsule 20 mg    labetalol (NORMODYNE) injection 40 mg   labetalol (NORMODYNE) 100 MG tablet    Sig: Take 3 tablets (300 mg total) by mouth 2 (two) times daily.    Dispense:  90 tablet    Refill:  1    Order Specific Question:   Supervising Provider    Answer:   Donnamae Jude [[id number]]   Imaging Orders  No imaging studies ordered today    MDM Reviewed records from MFM appt and from previous OB scanned in media Labs ordered - results reviewed BPs managed with oral & IV meds while in MAU Assessment and Plan   1. Chronic hypertension complicating or reason for care during pregnancy, third trimester   2. [redacted] weeks gestation of pregnancy   3. Essential hypertension, benign    -Severe range BPs treated with oral procardia and IV labetalol. BP improved prior to discharge.  -Preeclampsia labs normal & patient asymptomatic. Per reviewed of previous visits, she has a severe range BP at her last OB visit. Consulted with Dr. Elly Modena who recommends increasing labetalol to 300 mg BID.  -Patient needs f/u OB appt this week. Message sent to [place] -Preeclampsia s/s reviewed   Jorje Guild, NP 07/24/22 7:39 PM

## 2022-07-24 NOTE — MAU Note (Signed)
Krista Adams is a 36 y.o. at 74w6dhere in MAU reporting: dr's office wanted her to come over, her BP was  up. Is on meds for HTN, took it today. States was real real agitated  early. Denies HA, visual changes, epigastric pain or increase in swelling.  Denis bleeding or LOF, "baby didn't move  while she was at the office"  Onset of complaint: today Pain score: no Vitals:   07/24/22 1557  BP: (!) 168/71  Pulse: 61  Resp: 18  Temp: 98.1 F (36.7 C)  SpO2: 97%     FHT:146 Lab orders placed from triage:  urine obtained

## 2022-07-24 NOTE — Progress Notes (Addendum)
MFM Consult Note Patient Name: Krista Adams  Patient MRN:   [id number]  Referring provider: Gordo  Reason for Consult: Elevated BP   HPI: Krista Adams is a 36 y.o. G1P0000 at 33w6dby chronic hypertension on labetalol, type 2 diabetes on insulin advanced maternal age and maternal obesity.  The patient was here for a BPP and NST which was a 10 out of 10.  However her blood pressure was 166/68 with a repeat of 162/76.  Her pulse was 60.  She denied headache, vision changes, chest pain, shortness of breath or right upper quadrant pain.  She reports compliance with her medications.  Review of Systems: A review of systems was performed and was negative except per HPI   Counseling I discussed my concern for exacerbation of chronic hypertension versus superimposed preeclampsia.  I discussed the maternal and fetal implications of the range blood pressures including stroke, myocardial infarction, placental abruption and maternal and fetal death.  I also discussed my concern for her needing blood pressure medication adjustment given her increase in systolic blood pressure and pulse of 60.  We briefly discussed her blood sugars which seem well controlled overall however she does report lower blood glucose than usual.  We discussed this could be signs of placental insufficiency although this is not technically high to make the diagnosis. I discussed delivery at 37 weeks if this is a exacerbation of chronic hypertension or superimposed preeclampsia without severe features.  However, if she develops super imposed preeclampsia with severe features consider delivery at [redacted] weeks gestation.  We also discussed the possibility that this could simply be blood pressure cuff error or random elevation of blood pressure due to stress.  The patient reports that she has had a stressful day and this could be because of her underlying blood pressure.  Vitals Blood pressure: 166/68, repeat 162/76, Pulse: 60, Prepregnancy  BMI: 35  Recommendations Chronic hypertension on labetalol Type 2 diabetes Advanced maternal age Maternal obesity -Due to the patient's severe systolic blood pressure I recommend she go to the MAU for evaluation.  I called the MAU provider on call to notify her regarding the patient. -Consider discontinuing labetalol in favor of nifedipine given her elevated systolic blood pressure and low pulse.  -Ultimately defer management to the MAU provider after assessment. --If discharged recommend twice weekly NST and weekly AFI or weekly BPP until delivery around 37w or sooner if indicated  I spent 20 minutes reviewing the patients chart, including labs and images as well as counseling the patient about her medical conditions.  BValeda Malm MFM, [place]  07/24/2022  3:31 PM

## 2022-07-24 NOTE — MAU Note (Signed)
D/w Jorje Guild NP, NP informs RN to hold on IV meds at this time. Will consult attending physician and then inform RN of Christine.

## 2022-07-24 NOTE — Procedures (Signed)
OTTIE NEGLIA [birth year] [redacted]w[redacted]d Fetus A Non-Stress Test Interpretation for 07/24/22  Indication: Chronic Hypertenstion, Diabetes, and obesity  Fetal Heart Rate A Mode: External Baseline Rate (A): 130 bpm Variability: Moderate Accelerations: 10 x 10, 15 x 15 Decelerations: None Multiple birth?: No  Uterine Activity Mode: Palpation Contraction Frequency (min): None  Interpretation (Fetal Testing) Nonstress Test Interpretation: Reactive Comments: Dr. SEpimenio Sarinreviewed tracing.

## 2022-07-26 ENCOUNTER — Ambulatory Visit (INDEPENDENT_AMBULATORY_CARE_PROVIDER_SITE_OTHER): Payer: BC Managed Care – PPO | Admitting: Obstetrics and Gynecology

## 2022-07-26 VITALS — BP 136/79 | HR 66 | Wt 294.0 lb

## 2022-07-26 DIAGNOSIS — O0990 Supervision of high risk pregnancy, unspecified, unspecified trimester: Secondary | ICD-10-CM

## 2022-07-26 DIAGNOSIS — O24113 Pre-existing diabetes mellitus, type 2, in pregnancy, third trimester: Secondary | ICD-10-CM

## 2022-07-26 DIAGNOSIS — Z6841 Body Mass Index (BMI) 40.0 and over, adult: Secondary | ICD-10-CM

## 2022-07-26 DIAGNOSIS — O09513 Supervision of elderly primigravida, third trimester: Secondary | ICD-10-CM

## 2022-07-26 NOTE — Progress Notes (Signed)
Pt reports fetal movement. Pt reports that she forgot to check BG this morning but fasting has been in 80-90's

## 2022-07-26 NOTE — Progress Notes (Signed)
   PRENATAL VISIT NOTE  Subjective:  Krista Adams is a 36 y.o. G1P0000 at 64w1dbeing seen today for ongoing prenatal care.  She is currently monitored for the following issues for this high-risk pregnancy and has DM (diabetes mellitus), type 2, uncontrolled; Morbid obesity (HCarlyss; Essential hypertension; Depression with anxiety; Hypertension affecting pregnancy; Diabetes mellitus (H[place]; Advanced maternal age, primigravida; Pre-existing type 2 diabetes mellitus in pregnancy; Systolic murmur; Supervision of high risk pregnancy, antepartum; and BMI 50.0-59.9, adult ([place] on their problem list.  Patient doing well with no acute concerns today. She reports no complaints.  Contractions: Not present. Vag. Bleeding: None.  Movement: Present. Denies leaking of fluid.   The following portions of the patient's history were reviewed and updated as appropriate: allergies, current medications, past family history, past medical history, past social history, past surgical history and problem list. Problem list updated.  Objective:   Vitals:   07/26/22 1026  BP: 136/79  Pulse: 66  Weight: 294 lb (133.4 kg)    Fetal Status: Fetal Heart Rate (bpm): 130 Fundal Height: 35 cm Movement: Present     General:  Alert, oriented and cooperative. Patient is in no acute distress.  Skin: Skin is warm and dry. No rash noted.   Cardiovascular: Normal heart rate noted  Respiratory: Normal respiratory effort, no problems with respiration noted  Abdomen: Soft, gravid, appropriate for gestational age.  Pain/Pressure: Absent     Pelvic: Cervical exam deferred        Extremities: Normal range of motion.  Edema: Trace  Mental Status:  Normal mood and affect. Normal behavior. Normal judgment and thought content.   Assessment and Plan:  Pregnancy: G1P0000 at 372w1d1. Supervision of high risk pregnancy, antepartum Continue routine prenatal care  2. Pre-existing type 2 diabetes mellitus during pregnancy in third  trimester Pt did not bring blood sugars today Per pt FBS: 80-90 PPBS: 130-140  Pt takes basaglar 65 units in AM and 60 units in the PM Sliding scale of regular insulin If pt has poor blood sugar control or continues to not bring in blood sugars, consider delivery at 37 weeks.  Continue weekly fetal testing  3. Morbid obesity ([place]Or[place]  4. Primigravida of advanced maternal age in third trimester   5. BMI 50.0-59.9, adult (HCBellemeade  Preterm labor symptoms and general obstetric precautions including but not limited to vaginal bleeding, contractions, leaking of fluid and fetal movement were reviewed in detail with the patient.  Please refer to After Visit Summary for other counseling recommendations.   Return in about 2 weeks (around 08/09/2022) [facility], 36 weeks swabs.   LaLynnda ShieldsMD Faculty Attending Center for [facility]

## 2022-08-02 ENCOUNTER — Ambulatory Visit: Payer: BC Managed Care – PPO | Attending: Obstetrics and Gynecology

## 2022-08-02 ENCOUNTER — Ambulatory Visit: Payer: BC Managed Care – PPO | Admitting: *Deleted

## 2022-08-02 ENCOUNTER — Encounter: Payer: Self-pay | Admitting: *Deleted

## 2022-08-02 VITALS — BP 137/68 | HR 62

## 2022-08-02 DIAGNOSIS — E669 Obesity, unspecified: Secondary | ICD-10-CM

## 2022-08-02 DIAGNOSIS — Z3A35 35 weeks gestation of pregnancy: Secondary | ICD-10-CM

## 2022-08-02 DIAGNOSIS — O10913 Unspecified pre-existing hypertension complicating pregnancy, third trimester: Secondary | ICD-10-CM | POA: Insufficient documentation

## 2022-08-02 DIAGNOSIS — O09513 Supervision of elderly primigravida, third trimester: Secondary | ICD-10-CM | POA: Diagnosis present

## 2022-08-02 DIAGNOSIS — O0990 Supervision of high risk pregnancy, unspecified, unspecified trimester: Secondary | ICD-10-CM

## 2022-08-02 DIAGNOSIS — O99213 Obesity complicating pregnancy, third trimester: Secondary | ICD-10-CM

## 2022-08-02 DIAGNOSIS — Z794 Long term (current) use of insulin: Secondary | ICD-10-CM

## 2022-08-02 DIAGNOSIS — O10013 Pre-existing essential hypertension complicating pregnancy, third trimester: Secondary | ICD-10-CM

## 2022-08-02 DIAGNOSIS — O24113 Pre-existing diabetes mellitus, type 2, in pregnancy, third trimester: Secondary | ICD-10-CM

## 2022-08-02 DIAGNOSIS — O09523 Supervision of elderly multigravida, third trimester: Secondary | ICD-10-CM | POA: Insufficient documentation

## 2022-08-02 DIAGNOSIS — E119 Type 2 diabetes mellitus without complications: Secondary | ICD-10-CM | POA: Diagnosis not present

## 2022-08-02 NOTE — Procedures (Signed)
KARISSA MEENAN 1986/03/17 [redacted]w[redacted]d Fetus A Non-Stress Test Interpretation for 08/02/22  Indication: Diabetes, CHTN  Fetal Heart Rate A Mode: External Baseline Rate (A): 135 bpm Variability: Moderate Accelerations: 15 x 15 Decelerations: None Multiple birth?: No  Uterine Activity Mode: Palpation, Toco Contraction Frequency (min): none Resting Tone Palpated: Relaxed  Interpretation (Fetal Testing) Nonstress Test Interpretation: Reactive Overall Impression: Reassuring for gestational age Comments: Dr. FAnnamaria Bootsreviewed tracing

## 2022-08-09 ENCOUNTER — Ambulatory Visit: Payer: BC Managed Care – PPO | Admitting: *Deleted

## 2022-08-09 ENCOUNTER — Ambulatory Visit: Payer: BC Managed Care – PPO

## 2022-08-09 ENCOUNTER — Ambulatory Visit (INDEPENDENT_AMBULATORY_CARE_PROVIDER_SITE_OTHER): Payer: BC Managed Care – PPO | Admitting: Obstetrics and Gynecology

## 2022-08-09 ENCOUNTER — Other Ambulatory Visit (HOSPITAL_COMMUNITY)
Admission: RE | Admit: 2022-08-09 | Discharge: 2022-08-09 | Disposition: A | Discharge status: Home / Self Care | Payer: BC Managed Care – PPO | Source: Ambulatory Visit | Attending: Obstetrics and Gynecology | Admitting: Obstetrics and Gynecology

## 2022-08-09 ENCOUNTER — Ambulatory Visit (HOSPITAL_BASED_OUTPATIENT_CLINIC_OR_DEPARTMENT_OTHER): Payer: BC Managed Care – PPO

## 2022-08-09 ENCOUNTER — Encounter: Payer: Self-pay | Admitting: Obstetrics and Gynecology

## 2022-08-09 VITALS — BP 152/61 | HR 68

## 2022-08-09 VITALS — BP 131/82 | HR 69 | Wt 294.5 lb

## 2022-08-09 DIAGNOSIS — Z3A36 36 weeks gestation of pregnancy: Secondary | ICD-10-CM

## 2022-08-09 DIAGNOSIS — O09513 Supervision of elderly primigravida, third trimester: Secondary | ICD-10-CM | POA: Insufficient documentation

## 2022-08-09 DIAGNOSIS — O24113 Pre-existing diabetes mellitus, type 2, in pregnancy, third trimester: Secondary | ICD-10-CM

## 2022-08-09 DIAGNOSIS — O0993 Supervision of high risk pregnancy, unspecified, third trimester: Secondary | ICD-10-CM

## 2022-08-09 DIAGNOSIS — O10913 Unspecified pre-existing hypertension complicating pregnancy, third trimester: Secondary | ICD-10-CM

## 2022-08-09 DIAGNOSIS — E669 Obesity, unspecified: Secondary | ICD-10-CM

## 2022-08-09 DIAGNOSIS — O0990 Supervision of high risk pregnancy, unspecified, unspecified trimester: Secondary | ICD-10-CM

## 2022-08-09 DIAGNOSIS — O10013 Pre-existing essential hypertension complicating pregnancy, third trimester: Secondary | ICD-10-CM | POA: Diagnosis not present

## 2022-08-09 DIAGNOSIS — O09523 Supervision of elderly multigravida, third trimester: Secondary | ICD-10-CM

## 2022-08-09 DIAGNOSIS — O99213 Obesity complicating pregnancy, third trimester: Secondary | ICD-10-CM

## 2022-08-09 DIAGNOSIS — E119 Type 2 diabetes mellitus without complications: Secondary | ICD-10-CM

## 2022-08-09 DIAGNOSIS — N644 Mastodynia: Secondary | ICD-10-CM

## 2022-08-09 MED ORDER — NYSTATIN 100000 UNIT/GM EX OINT: 1.0000 | TOPICAL_OINTMENT | Freq: Two times a day (BID) | CUTANEOUS | 0 refills | Status: DC

## 2022-08-09 MED ORDER — LIDOCAINE VISCOUS HCL 2 % MT SOLN: 5.0000 mL | OROMUCOSAL | 0 refills | Status: DC | PRN

## 2022-08-09 NOTE — Progress Notes (Signed)
HIGH-RISK PREGNANCY OFFICE VISIT Patient name: Krista Adams MRN [id number]  Date of birth: 10-06-1986 Chief Complaint:   Routine Prenatal Visit  History of Present Illness:   Krista Adams is a 36 y.o. G43P0000 female at 35w1dwith an Estimated Date of Delivery: 09/05/22 being seen today for ongoing management of a high-risk pregnancy complicated by  TH4LPon inuslin, morbid obesity and cHTN on Labetalol. Today she reports  real bad nipple pain. She reports being unable to wear a bra since she got pregnant . She reports being Rx'd lidocaine jelly and a cream that starts with N. She is requesting a refill on both of those medications. Contractions: Not present. Vag. Bleeding: None.  Movement: Present. denies leaking of fluid.  Review of Systems:   Pertinent items are noted in HPI Denies abnormal vaginal discharge w/ itching/odor/irritation, headaches, visual changes, shortness of breath, chest pain, abdominal pain, severe nausea/vomiting, or problems with urination or bowel movements unless otherwise stated above. Pertinent History Reviewed:  Reviewed past medical,surgical, social, obstetrical and family history.  Reviewed problem list, medications and allergies. Physical Assessment:   Vitals:   08/09/22 1334  BP: 131/82  Pulse: 69  Weight: 294 lb 8 oz (133.6 kg)  Body mass index is 50.55 kg/m.           Physical Examination:   General appearance: alert, well appearing, and in no distress, oriented to person, place, and time, and overweight  Mental status: alert, oriented to person, place, and time, normal mood, behavior, speech, dress, motor activity, and thought processes  Skin: warm & dry   Extremities: Edema: Trace    Cardiovascular: normal heart rate noted  Respiratory: normal respiratory effort, no distress  Abdomen: gravid, soft, non-tender  Pelvic: Cervical exam deferred         Fetal Status: Fetal Heart Rate (bpm): 127   Movement: Present    Fetal Surveillance Testing  today: none - scheduled for MFM U/S after this appt today   No results found for this or any previous visit (from the past 24 hour(s)).  Assessment & Plan:  1) High-risk pregnancy G1P0000 at 382w1dith an Estimated Date of Delivery: 09/05/22   2) Supervision of high risk pregnancy, antepartum  - Cervicovaginal ancillary only( [place]),  - Strep Gp B NAA - Reports MFM told her she should be induced at 37-38 weeks. No documentation seen at this time reflecting that. - Advised since U/S is so close to the time now, ask MFM to document clearly in her chart today when they are recommending IOL - IOL can be scheduled by provider at nv.  3) Pre-existing type 2 diabetes mellitus during pregnancy in third trimester - Elevated FBS this AM - No log reviewed  4) Nipple pain  - Rx for lidocaine (XYLOCAINE) 2 % solution,  - Rx for nystatin ointment (MYCOSTATIN)  5) [redacted] weeks gestation of pregnancy   Meds:  Meds ordered this encounter  Medications   lidocaine (XYLOCAINE) 2 % solution    Sig: Use as directed 5 mLs in the mouth or throat as needed for mouth pain.    Dispense:  100 mL    Refill:  0    Order Specific Question:   Supervising Provider    Answer:   PRDonnamae [id number]]   nystatin ointment (MYCOSTATIN)    Sig: Apply 1 Application topically 2 (two) times daily.    Dispense:  30 g    Refill:  0  Order Specific Question:   Supervising Provider    Answer:   Merrily Pew    Labs/procedures today: GBS, GC/CT  Treatment Plan:  continue  Reviewed: Preterm labor symptoms and general obstetric precautions including but not limited to vaginal bleeding, contractions, leaking of fluid and fetal movement were reviewed in detail with the patient.  All questions were answered. Has home bp cuff. Check bp weekly, let us know if >140/90.   Follow-up: Return in about 1 week (around 08/16/2022) for Return OB visit.  Orders Placed This Encounter  Procedures   Strep Gp B NAA    Laury Deep MSN, CNM 08/09/2022 2:09 PM

## 2022-08-09 NOTE — Procedures (Signed)
Krista Adams Aug 22, 1986 [redacted]w[redacted]d Fetus A Non-Stress Test Interpretation for 08/09/22  Indication: Chronic Hypertenstion, Diabetes  Fetal Heart Rate A Mode: External Baseline Rate (A): 135 bpm Variability: Moderate Accelerations: 15 x 15 Decelerations: None Multiple birth?: No  Uterine Activity Mode: Palpation, Toco Contraction Frequency (min): none Resting Tone Palpated: Relaxed  Interpretation (Fetal Testing) Nonstress Test Interpretation: Reactive Overall Impression: Reassuring for gestational age Comments: Dr. SDonalee Citrinreviewed tracing

## 2022-08-09 NOTE — Progress Notes (Signed)
Pt reports fetal movement, denies pain. Reports fasting BG today was 110.

## 2022-08-11 ENCOUNTER — Other Ambulatory Visit: Payer: Self-pay | Admitting: Obstetrics and Gynecology

## 2022-08-11 LAB — STREP GP B NAA: Strep Gp B NAA: NEGATIVE

## 2022-08-14 ENCOUNTER — Ambulatory Visit (INDEPENDENT_AMBULATORY_CARE_PROVIDER_SITE_OTHER): Payer: BC Managed Care – PPO

## 2022-08-14 VITALS — BP 150/83 | HR 65 | Wt 295.0 lb

## 2022-08-14 DIAGNOSIS — O10913 Unspecified pre-existing hypertension complicating pregnancy, third trimester: Secondary | ICD-10-CM

## 2022-08-14 DIAGNOSIS — O24113 Pre-existing diabetes mellitus, type 2, in pregnancy, third trimester: Secondary | ICD-10-CM

## 2022-08-14 DIAGNOSIS — O09513 Supervision of elderly primigravida, third trimester: Secondary | ICD-10-CM | POA: Diagnosis not present

## 2022-08-14 DIAGNOSIS — N644 Mastodynia: Secondary | ICD-10-CM

## 2022-08-14 DIAGNOSIS — O0990 Supervision of high risk pregnancy, unspecified, unspecified trimester: Secondary | ICD-10-CM | POA: Diagnosis not present

## 2022-08-14 DIAGNOSIS — Z3A36 36 weeks gestation of pregnancy: Secondary | ICD-10-CM

## 2022-08-14 LAB — CERVICOVAGINAL ANCILLARY ONLY
Chlamydia: NEGATIVE
Comment: NEGATIVE
Comment: NORMAL
Neisseria Gonorrhea: NEGATIVE

## 2022-08-14 NOTE — Progress Notes (Signed)
Pt presents for ROB visit. Pt scheduled for induction 08/15/22. No concerns at this time.

## 2022-08-14 NOTE — Progress Notes (Signed)
HIGH-RISK PREGNANCY OFFICE VISIT  Patient name: Krista Adams MRN [id number]  Date of birth: 1985/12/22 Chief Complaint:   Routine Prenatal Visit  Subjective:   Krista Adams is a 36 y.o. G24P0000 female at 63w6dwith an Estimated Date of Delivery: 09/05/22 being seen today for ongoing management of a high-risk pregnancy aeb has DM (diabetes mellitus), type 2, uncontrolled; Morbid obesity ([place]; Essential hypertension; Depression with anxiety; Hypertension affecting pregnancy; Diabetes mellitus ([place]; Advanced maternal age, primigravida; Pre-existing type 2 diabetes mellitus in pregnancy; Systolic murmur; Supervision of high risk pregnancy, antepartum; and BMI 50.0-59.9, adult ([place] on their problem list.  Patient presents today with  breast sensitivity and pain .  She states she has experienced this all throughout the pregnancy and looks forward to induction to help with this discomfort.She reports usage of nipple cream with some relief.  Patient endorses fetal movement. Patient denies abdominal cramping or contractions.  Patient denies vaginal concerns including abnormal discharge, leaking of fluid, and bleeding.  Contractions: Not present. Vag. Bleeding: None.  Movement: Present.  Reviewed past medical,surgical, social, obstetrical and family history as well as problem list, medications and allergies.  Objective   Vitals:   08/14/22 1538 08/14/22 1542  BP: (!) 165/77 (!) 150/83  Pulse: 67 65  Weight: 295 lb (133.8 kg)   Body mass index is 50.64 kg/m.  Total Weight Gain:87 lb (39.5 kg)         Physical Examination:   General appearance: Well appearing, and in no distress  Mental status: Alert, oriented to person, place, and time  Skin: Warm & dry  Cardiovascular: Normal heart rate noted  Respiratory: Normal respiratory effort, no distress  Abdomen: Soft, gravid, nontender  Pelvic: Cervical exam deferred           Extremities: Edema: Trace  Fetal Status:    Movement:  Present   No results found for this or any previous visit (from the past 24 hour(s)).  Assessment & Plan:  High-risk pregnancy of a 36y.o., G1P0000 at 32w6dith an Estimated Date of Delivery: 09/05/22   1. Supervision of high risk pregnancy, antepartum -Scheduled for IOL tomorrow morning.  -Discussed r/b of induction including fetal distress, serial induction, pain, and increased risk of c/s delivery -Discussed induction methods including cervical ripening agents, foley bulbs, and pitocin -Informed that unable to place outpatient foley today d/t time constraints.   2. Primigravida of advanced maternal age in third trimester -Unsure if she desires future conception. -Considering BTL, but does not want her partner to know. -Informed that OBGYNs are good at keeping secrets!  Discussed notifying providers at hospital or during [place] if deciding to proceed. -Reviewed other contraception options.   3. Nipple pain -Some relief with creams. -Cautioned that pain may worsen after delivery, but soon resolve. -Instructed to monitor and report any worsening or new symptoms.   4. Chronic hypertension complicating or reason for care during pregnancy, third trimester -BP elevated, but not severe. -Precautions given.   5. Pre-existing type 2 diabetes mellitus during pregnancy in third trimester BS Sept 5-12 -Fasting: 72-110 (4 elevated) -Breakfast: 79, 105-155 ( 3 elevated) -Lunch: 117-171 (4 elevated) -Dinner: 112-192 (5 elevated) -Informed that levels are overall elevated. -IOL scheduled   Meds: No orders of the defined types were placed in this encounter.  Labs/procedures today:  Lab Orders  No laboratory test(s) ordered today     Reviewed: Term labor symptoms and general obstetric precautions including but not limited to vaginal  bleeding, contractions, leaking of fluid and fetal movement were reviewed in detail with the patient.  All questions were answered.  Follow-up:  No follow-ups on file.  No orders of the defined types were placed in this encounter.  Maryann Conners MSN, CNM 08/14/2022

## 2022-08-15 ENCOUNTER — Inpatient Hospital Stay (HOSPITAL_COMMUNITY)
Admission: AD | Admit: 2022-08-15 | Discharge: 2022-08-21 | DRG: 786 | Disposition: A | Discharge status: Home / Self Care | Payer: BC Managed Care – PPO | Attending: Obstetrics and Gynecology | Admitting: Obstetrics and Gynecology

## 2022-08-15 ENCOUNTER — Encounter (HOSPITAL_COMMUNITY): Payer: Self-pay | Admitting: Family Medicine

## 2022-08-15 ENCOUNTER — Inpatient Hospital Stay (HOSPITAL_COMMUNITY): Payer: BC Managed Care – PPO

## 2022-08-15 ENCOUNTER — Other Ambulatory Visit: Payer: Self-pay

## 2022-08-15 DIAGNOSIS — O411230 Chorioamnionitis, third trimester, not applicable or unspecified: Secondary | ICD-10-CM | POA: Diagnosis present

## 2022-08-15 DIAGNOSIS — Z87891 Personal history of nicotine dependence: Secondary | ICD-10-CM

## 2022-08-15 DIAGNOSIS — E1169 Type 2 diabetes mellitus with other specified complication: Secondary | ICD-10-CM

## 2022-08-15 DIAGNOSIS — O1414 Severe pre-eclampsia complicating childbirth: Secondary | ICD-10-CM | POA: Diagnosis not present

## 2022-08-15 DIAGNOSIS — Z23 Encounter for immunization: Secondary | ICD-10-CM | POA: Diagnosis not present

## 2022-08-15 DIAGNOSIS — O99214 Obesity complicating childbirth: Secondary | ICD-10-CM | POA: Diagnosis not present

## 2022-08-15 DIAGNOSIS — O119 Pre-existing hypertension with pre-eclampsia, unspecified trimester: Secondary | ICD-10-CM | POA: Diagnosis present

## 2022-08-15 DIAGNOSIS — O2412 Pre-existing diabetes mellitus, type 2, in childbirth: Secondary | ICD-10-CM | POA: Diagnosis not present

## 2022-08-15 DIAGNOSIS — O0990 Supervision of high risk pregnancy, unspecified, unspecified trimester: Principal | ICD-10-CM

## 2022-08-15 DIAGNOSIS — Z3A37 37 weeks gestation of pregnancy: Secondary | ICD-10-CM

## 2022-08-15 DIAGNOSIS — F418 Other specified anxiety disorders: Secondary | ICD-10-CM | POA: Diagnosis present

## 2022-08-15 DIAGNOSIS — E119 Type 2 diabetes mellitus without complications: Secondary | ICD-10-CM

## 2022-08-15 DIAGNOSIS — Z6841 Body Mass Index (BMI) 40.0 and over, adult: Secondary | ICD-10-CM

## 2022-08-15 DIAGNOSIS — O24119 Pre-existing diabetes mellitus, type 2, in pregnancy, unspecified trimester: Secondary | ICD-10-CM | POA: Diagnosis present

## 2022-08-15 DIAGNOSIS — O114 Pre-existing hypertension with pre-eclampsia, complicating childbirth: Secondary | ICD-10-CM | POA: Diagnosis present

## 2022-08-15 DIAGNOSIS — Z794 Long term (current) use of insulin: Secondary | ICD-10-CM | POA: Diagnosis not present

## 2022-08-15 DIAGNOSIS — E1165 Type 2 diabetes mellitus with hyperglycemia: Secondary | ICD-10-CM | POA: Diagnosis present

## 2022-08-15 DIAGNOSIS — O1002 Pre-existing essential hypertension complicating childbirth: Secondary | ICD-10-CM | POA: Diagnosis present

## 2022-08-15 DIAGNOSIS — O09523 Supervision of elderly multigravida, third trimester: Secondary | ICD-10-CM | POA: Diagnosis not present

## 2022-08-15 DIAGNOSIS — O09513 Supervision of elderly primigravida, third trimester: Secondary | ICD-10-CM

## 2022-08-15 DIAGNOSIS — O09519 Supervision of elderly primigravida, unspecified trimester: Secondary | ICD-10-CM

## 2022-08-15 DIAGNOSIS — O169 Unspecified maternal hypertension, unspecified trimester: Secondary | ICD-10-CM | POA: Diagnosis present

## 2022-08-15 DIAGNOSIS — I10 Essential (primary) hypertension: Secondary | ICD-10-CM | POA: Diagnosis present

## 2022-08-15 DIAGNOSIS — O10919 Unspecified pre-existing hypertension complicating pregnancy, unspecified trimester: Secondary | ICD-10-CM | POA: Diagnosis present

## 2022-08-15 LAB — COMPREHENSIVE METABOLIC PANEL
ALT: 10 U/L (ref 0–44)
AST: 24 U/L (ref 15–41)
Albumin: 2.6 g/dL — ABNORMAL LOW (ref 3.5–5.0)
Alkaline Phosphatase: 74 U/L (ref 38–126)
Anion gap: 9 (ref 5–15)
BUN: 10 mg/dL (ref 6–20)
CO2: 19 mmol/L — ABNORMAL LOW (ref 22–32)
Calcium: 8.9 mg/dL (ref 8.9–10.3)
Chloride: 108 mmol/L (ref 98–111)
Creatinine, Ser: 0.71 mg/dL (ref 0.44–1.00)
GFR, Estimated: >60 mL/min (ref >60)
Glucose, Bld: 83 mg/dL (ref 70–99)
Potassium: 4.4 mmol/L (ref 3.5–5.1)
Sodium: 136 mmol/L (ref 135–145)
Total Bilirubin: 0.5 mg/dL (ref 0.3–1.2)
Total Protein: 6.1 g/dL — ABNORMAL LOW (ref 6.5–8.1)

## 2022-08-15 LAB — CBC
HCT: 34.9 % — ABNORMAL LOW (ref 36.0–46.0)
Hemoglobin: 11.2 g/dL — ABNORMAL LOW (ref 12.0–15.0)
MCH: 28.4 pg (ref 26.0–34.0)
MCHC: 32.1 g/dL (ref 30.0–36.0)
MCV: 88.4 fL (ref 80.0–100.0)
Platelets: 236 10*3/uL (ref 150–400)
RBC: 3.95 MIL/uL (ref 3.87–5.11)
RDW: 13.5 % (ref 11.5–15.5)
WBC: 4.8 10*3/uL (ref 4.0–10.5)
nRBC: 0 % (ref 0.0–0.2)

## 2022-08-15 LAB — PROTEIN / CREATININE RATIO, URINE
Creatinine, Urine: 62 mg/dL
Protein Creatinine Ratio: 0.15 mg/mg{Cre} (ref 0.00–0.15)
Total Protein, Urine: 9 mg/dL

## 2022-08-15 LAB — GLUCOSE, CAPILLARY
Glucose-Capillary: 81 mg/dL (ref 70–99)
Glucose-Capillary: 119 mg/dL — ABNORMAL HIGH (ref 70–99)
Glucose-Capillary: 133 mg/dL — ABNORMAL HIGH (ref 70–99)
Glucose-Capillary: 116 mg/dL — ABNORMAL HIGH (ref 70–99)

## 2022-08-15 LAB — RPR: RPR Ser Ql: NONREACTIVE

## 2022-08-15 LAB — HEMOGLOBIN A1C
Hgb A1c MFr Bld: 6.3 % — ABNORMAL HIGH (ref 4.8–5.6)
Mean Plasma Glucose: 134.11 mg/dL

## 2022-08-15 MED ORDER — MISOPROSTOL 50MCG HALF TABLET
50.0000 ug | ORAL_TABLET | Freq: Once | ORAL | Status: AC
  Administered 2022-08-15: 50 ug via ORAL
  Filled 2022-08-15: qty 1

## 2022-08-15 MED ORDER — OXYTOCIN BOLUS FROM INFUSION: 333.0000 mL | Freq: Once | INTRAVENOUS | Status: DC

## 2022-08-15 MED ORDER — ONDANSETRON HCL 4 MG/2ML IJ SOLN
4.0000 mg | Freq: Four times a day (QID) | INTRAMUSCULAR | Status: DC | PRN
  Administered 2022-08-16 – 2022-08-18 (×2): 4 mg via INTRAVENOUS
  Filled 2022-08-15 (×2): qty 2

## 2022-08-15 MED ORDER — MISOPROSTOL 25 MCG QUARTER TABLET
25.0000 ug | ORAL_TABLET | Freq: Once | ORAL | Status: AC
  Administered 2022-08-15: 25 ug via VAGINAL
  Filled 2022-08-15: qty 1

## 2022-08-15 MED ORDER — HYDRALAZINE HCL 20 MG/ML IJ SOLN
10.0000 mg | INTRAMUSCULAR | Status: DC | PRN
  Administered 2022-08-15: 10 mg via INTRAVENOUS

## 2022-08-15 MED ORDER — HYDRALAZINE HCL 20 MG/ML IJ SOLN
10.0000 mg | INTRAMUSCULAR | Status: DC | PRN
  Filled 2022-08-15: qty 1

## 2022-08-15 MED ORDER — FENTANYL CITRATE (PF) 100 MCG/2ML IJ SOLN
100.0000 ug | INTRAMUSCULAR | Status: DC | PRN
  Administered 2022-08-15 – 2022-08-17 (×7): 100 ug via INTRAVENOUS
  Filled 2022-08-15 (×7): qty 2

## 2022-08-15 MED ORDER — INSULIN ASPART 100 UNIT/ML IJ SOLN
0.0000 [IU] | Freq: Three times a day (TID) | INTRAMUSCULAR | Status: DC
  Administered 2022-08-16 (×2): 2 [IU] via SUBCUTANEOUS
  Administered 2022-08-16: 3 [IU] via SUBCUTANEOUS

## 2022-08-15 MED ORDER — LABETALOL HCL 5 MG/ML IV SOLN
20.0000 mg | INTRAVENOUS | Status: DC | PRN
  Administered 2022-08-16: 20 mg via INTRAVENOUS

## 2022-08-15 MED ORDER — MAGNESIUM SULFATE 40 GM/1000ML IV SOLN
INTRAVENOUS | Status: AC
  Administered 2022-08-15: 4 g via INTRAVENOUS
  Filled 2022-08-15: qty 1000

## 2022-08-15 MED ORDER — LABETALOL HCL 5 MG/ML IV SOLN
20.0000 mg | INTRAVENOUS | Status: DC | PRN
  Administered 2022-08-15: 20 mg via INTRAVENOUS
  Filled 2022-08-15: qty 4

## 2022-08-15 MED ORDER — SOD CITRATE-CITRIC ACID 500-334 MG/5ML PO SOLN
30.0000 mL | ORAL | Status: DC | PRN
  Administered 2022-08-16 – 2022-08-18 (×2): 30 mL via ORAL
  Filled 2022-08-15 (×2): qty 30

## 2022-08-15 MED ORDER — MISOPROSTOL 50MCG HALF TABLET
50.0000 ug | ORAL_TABLET | ORAL | Status: DC
  Administered 2022-08-15 – 2022-08-16 (×3): 50 ug via ORAL
  Filled 2022-08-15 (×3): qty 1

## 2022-08-15 MED ORDER — LABETALOL HCL 5 MG/ML IV SOLN: 80.0000 mg | INTRAVENOUS | Status: DC | PRN

## 2022-08-15 MED ORDER — LACTATED RINGERS IV SOLN: INTRAVENOUS | Status: DC

## 2022-08-15 MED ORDER — INSULIN GLARGINE 100 UNIT/ML ~~LOC~~ SOLN
65.0000 [IU] | Freq: Every morning | SUBCUTANEOUS | Status: DC
  Administered 2022-08-15 – 2022-08-18 (×4): 65 [IU] via SUBCUTANEOUS
  Filled 2022-08-15 (×5): qty 0.65

## 2022-08-15 MED ORDER — OXYCODONE-ACETAMINOPHEN 5-325 MG PO TABS: 1.0000 | ORAL_TABLET | ORAL | Status: DC | PRN

## 2022-08-15 MED ORDER — MAGNESIUM SULFATE BOLUS VIA INFUSION
4.0000 g | Freq: Once | INTRAVENOUS | Status: AC
  Filled 2022-08-15: qty 1000

## 2022-08-15 MED ORDER — LABETALOL HCL 5 MG/ML IV SOLN
INTRAVENOUS | Status: AC
  Filled 2022-08-15: qty 4

## 2022-08-15 MED ORDER — OXYTOCIN-SODIUM CHLORIDE 30-0.9 UT/500ML-% IV SOLN
2.5000 [IU]/h | INTRAVENOUS | Status: DC
  Filled 2022-08-15: qty 500

## 2022-08-15 MED ORDER — LABETALOL HCL 5 MG/ML IV SOLN: 40.0000 mg | INTRAVENOUS | Status: DC | PRN

## 2022-08-15 MED ORDER — TERBUTALINE SULFATE 1 MG/ML IJ SOLN: 0.2500 mg | Freq: Once | INTRAMUSCULAR | Status: DC | PRN

## 2022-08-15 MED ORDER — LABETALOL HCL 5 MG/ML IV SOLN
40.0000 mg | INTRAVENOUS | Status: DC | PRN
  Administered 2022-08-15: 40 mg via INTRAVENOUS
  Filled 2022-08-15: qty 8

## 2022-08-15 MED ORDER — LABETALOL HCL 5 MG/ML IV SOLN
80.0000 mg | INTRAVENOUS | Status: DC | PRN
  Administered 2022-08-15: 80 mg via INTRAVENOUS
  Filled 2022-08-15: qty 16

## 2022-08-15 MED ORDER — ACETAMINOPHEN 325 MG PO TABS
650.0000 mg | ORAL_TABLET | ORAL | Status: DC | PRN
  Administered 2022-08-16 – 2022-08-17 (×2): 650 mg via ORAL
  Filled 2022-08-15 (×2): qty 2

## 2022-08-15 MED ORDER — LACTATED RINGERS IV SOLN: 500.0000 mL | INTRAVENOUS | Status: DC | PRN

## 2022-08-15 MED ORDER — OXYTOCIN 10 UNIT/ML IJ SOLN: 10.0000 [IU] | Freq: Once | INTRAMUSCULAR | Status: DC | PRN

## 2022-08-15 MED ORDER — LIDOCAINE HCL (PF) 1 % IJ SOLN: 30.0000 mL | INTRAMUSCULAR | Status: DC | PRN

## 2022-08-15 MED ORDER — MAGNESIUM SULFATE 40 GM/1000ML IV SOLN
2.0000 g/h | INTRAVENOUS | Status: AC
  Administered 2022-08-16 – 2022-08-19 (×5): 2 g/h via INTRAVENOUS
  Filled 2022-08-15 (×5): qty 1000

## 2022-08-15 MED ORDER — LABETALOL HCL 200 MG PO TABS
300.0000 mg | ORAL_TABLET | Freq: Two times a day (BID) | ORAL | Status: DC
  Administered 2022-08-15 – 2022-08-18 (×6): 300 mg via ORAL
  Filled 2022-08-15 (×6): qty 1

## 2022-08-15 MED ORDER — LEVONORGESTREL 20 MCG/DAY IU IUD
1.0000 | INTRAUTERINE_SYSTEM | Freq: Once | INTRAUTERINE | Status: DC
  Filled 2022-08-15: qty 1

## 2022-08-15 MED ORDER — OXYCODONE-ACETAMINOPHEN 5-325 MG PO TABS: 2.0000 | ORAL_TABLET | ORAL | Status: DC | PRN

## 2022-08-15 NOTE — Progress Notes (Signed)
Krista Adams is a 36 y.o. G1P0000 at 73w0dadmitted for  chronic hypertension now with super imposed pre-e.   Subjective: no complaints; feels like she is constantly having to use the bathroom. She denies HA, blurry vision, floating spots, LOF,   Objective: BP 136/65   Pulse 70   Temp 97.9 F (36.6 C) (Oral)   Resp 16   Ht '5\' 4"'$  (1.626 m)   Wt 133.7 kg   LMP  (LMP Unknown)   BMI 50.60 kg/m  Total I/O In: 917.1 [P.O.:75; I.V.:842.1] Out: 290 [Urine:290]  FHR baseline 130 bpm, Variability: moderate, Accelerations:present, Decelerations:  Absent Toco: none   SVE:   Dilation: Closed Effacement (%): Thick Station: Ballotable Exam by:: KMaye Hides CNM   Labs: Lab Results  Component Value Date   WBC 4.8 08/15/2022   HGB 11.2 (L) 08/15/2022   HCT 34.9 (L) 08/15/2022   MCV 88.4 08/15/2022   PLT 236 08/15/2022    Assessment / Plan: Early labor, very difficult to check cervix but she is long/closed/middle position; will continue with cytotec IOL   Labor: early Fetal Wellbeing:  Category I Pain Control:  labor support without medications Pre-eclampsia:  CHTN with s/f I/D:  GBS neg Anticipated MOD: NSVB  KStarr LakeCNM, WHNP-BC 08/15/2022, 6:01 PM

## 2022-08-15 NOTE — H&P (Addendum)
OBSTETRIC ADMISSION HISTORY AND PHYSICAL  Krista Adams is a 36 y.o. female G1P0000 with IUP at [redacted]w[redacted]d by [redacted]w[redacted]d [place] presenting for IOL for uncontrolled Type II DM and cHTN. She reports +FMs, No LOF, no VB, no blurry vision, headaches or peripheral edema, and RUQ pain.  She plans on breast feeding. She requests BTL for birth control.   She received her prenatal care at  [place]: By [redacted]w[redacted]d [place] not equal to LMP --->  Estimated Date of Delivery: 09/05/22  Sono:    '@[redacted]w[redacted]d'$ , CWD, normal anatomy, cephalic presentation, 0998P, 78% EFW   Prenatal History/Complications: Poorly controlled Type II DM, cHTN, AMA, Obesity  Past Medical History: Past Medical History:  Diagnosis Date   Anovulatory (dysfunctional uterine) bleeding 03/13/2012   Depression with anxiety 03/18/2012   DM (diabetes mellitus), type 2, uncontrolled 05/06/2008   Qualifier: Diagnosis of  By: Drue Flirt  MD, Merrily Brittle     Essential hypertension, benign 05/06/2008   Qualifier: Diagnosis of  By: Jamal Collin MD, Nathan     Morbid obesity ([place]) 05/06/2008   Qualifier: Diagnosis of  By: Jamal Collin MD, Ovid Curd      Past Surgical History: Past Surgical History:  Procedure Laterality Date   CHOLECYSTECTOMY      Obstetrical History: OB History     Gravida  1   Para  0   Term  0   Preterm  0   AB  0   Living  0      SAB  0   IAB  0   Ectopic  0   Multiple  0   Live Births  0           Social History Social History   Socioeconomic History   Marital status: Married    Spouse name: Not on file   Number of children: Not on file   Years of education: Not on file   Highest education level: Not on file  Occupational History   Not on file  Tobacco Use   Smoking status: Former    Packs/day: 1.00    Types: Cigars, Cigarettes   Smokeless tobacco: Never   Tobacco comments:    smokes black and mild ; reports not smoking as much  Vaping Use   Vaping Use: Former   Quit date: 01/10/2022   Substances: Nicotine,  Flavoring  Substance and Sexual Activity   Alcohol use: No   Drug use: Not Currently    Types: Marijuana    Comment: last use  feb 2023   Sexual activity: Yes    Partners: Male    Birth control/protection: None  Other Topics Concern   Not on file  Social History Narrative   Not on file   Social Determinants of Health   Financial Resource Strain: Not on file  Food Insecurity: Not on file  Transportation Needs: Not on file  Physical Activity: Not on file  Stress: Not on file  Social Connections: Not on file    Family History: Family History  Problem Relation Age of Onset   Diabetes Mother    Hypertension Mother    Hypertension Father    Asthma Brother    Asthma Maternal Aunt    Diabetes Maternal Aunt    Cancer Paternal Aunt    Cancer Paternal Uncle    Diabetes Paternal Uncle    Stroke Maternal Grandmother    Diabetes Maternal Grandmother    Cancer Paternal Grandmother    Asthma Other  Birth defects Neg Hx    Heart disease Neg Hx     Allergies: Allergies  Allergen Reactions   Pineapple     itching    Medications Prior to Admission  Medication Sig Dispense Refill Last Dose   ACCU-CHEK AVIVA PLUS test strip USE TO CHECK BLOOD SUGARS TWICE DAILY E11.69 100 strip 2    blood glucose meter kit and supplies KIT Dispense based on patient and insurance preference. Use up to four times daily as directed. (FOR ICD-9 250.00, 250.01). 1 each 0    famotidine (PEPCID) 20 MG tablet Take 1 tablet (20 mg total) by mouth 2 (two) times daily. 30 tablet 2    insulin aspart (NOVOLOG) 100 UNIT/ML injection Inject into the skin 3 (three) times daily before meals.      Insulin Glargine (BASAGLAR KWIKPEN [place]) Inject into the skin. 65units in the morning and 60units at night      labetalol (NORMODYNE) 100 MG tablet Take 3 tablets (300 mg total) by mouth 2 (two) times daily. 90 tablet 1    lidocaine (XYLOCAINE) 2 % solution Use as directed 5 mLs in the mouth or throat as needed for mouth  pain. 100 mL 0    metoCLOPramide (REGLAN) 10 MG tablet Take 1 tablet (10 mg total) by mouth every 6 (six) hours. 30 tablet 2    nystatin ointment (MYCOSTATIN) Apply 1 Application topically 2 (two) times daily. 30 g 0    pantoprazole (PROTONIX) 40 MG tablet Take 1 tablet (40 mg total) by mouth daily. 30 tablet 1      Review of Systems   All systems reviewed and negative except as stated in HPI  Blood pressure (!) 157/71, pulse 61, temperature 97.7 F (36.5 C), temperature source Oral, resp. rate 16, height 5\' 4"  (1.626 m), weight 133.7 kg. General appearance: alert, cooperative, appears stated age, and morbidly obese Lungs: clear to auscultation bilaterally Heart: regular rate and rhythm Abdomen: soft, non-tender; bowel sounds normal Pelvic: No abnormalities Extremities: Homans sign is negative, no sign of DVT DTR's intact Presentation: cephalic Fetal monitoringBaseline: 125 bpm, Variability: Good {> 6 bpm), Accelerations: Reactive, and Decelerations: Absent Uterine activityFrequency: Less than 1 every 10 minutes  Dilation: Closed Effacement (%): Thick Station: -3 Exam by:: Dr. [email or web address]   Prenatal labs: ABO, Rh: --/--/B POS (09/13 04-21-1992) Antibody: POS (09/13 0714) Rubella: Immune (03/07 0000) RPR: Non Reactive (07/25 1653)  HBsAg: Negative (03/07 0000)  HIV: Non Reactive (07/25 1653)  GBS: Negative/-- (09/07 1434)  1 hr Glucola preexisting Type II diabetes Genetic screening  Unknown Anatomy 08-13-1978 technically difficult due to body habitus.   Prenatal Transfer Tool  Maternal Diabetes: Yes:  Diabetes Type:  Pre-pregnancy, insulin controlled Genetic Screening: Declined Maternal Ultrasounds/Referrals: Normal Fetal Ultrasounds or other Referrals:  Referred to Materal Fetal Medicine  Maternal Substance Abuse:  No Significant Maternal Medications:  None Significant Maternal Lab Results:  Group B Strep negative Number of Prenatal Visits:greater than 3 verified prenatal  visits Other Comments:  None  Results for orders placed or performed during the hospital encounter of 08/15/22 (from the past 24 hour(s))  CBC   Collection Time: 08/15/22  7:14 AM  Result Value Ref Range   WBC 4.8 4.0 - 10.5 K/uL   RBC 3.95 3.87 - 5.11 MIL/uL   Hemoglobin 11.2 (L) 12.0 - 15.0 g/dL   HCT 08/17/22 (L) 40.9 - 03.9 %   MCV 88.4 80.0 - 100.0 fL   MCH 28.4 26.0 - 34.0 pg  MCHC 32.1 30.0 - 36.0 g/dL   RDW 13.5 11.5 - 15.5 %   Platelets 236 150 - 400 K/uL   nRBC 0.0 0.0 - 0.2 %  Type and screen   Collection Time: 08/15/22  7:14 AM  Result Value Ref Range   ABO/RH(D) B POS    Antibody Screen POS    Sample Expiration      08/18/2022,2359 Performed at [facility], [place] [address], [place], [place] [address]   Glucose, capillary   Collection Time: 08/15/22  7:21 AM  Result Value Ref Range   Glucose-Capillary 81 70 - 99 mg/dL    Patient Active Problem List   Diagnosis Date Noted   Chronic hypertension affecting pregnancy 08/15/2022   BMI 50.0-59.9, adult ([place]) 07/26/2022   Diabetes mellitus ([place]) 88/82/8003   Systolic murmur 49/17/9150   Supervision of high risk pregnancy, antepartum 06/11/2022   Hypertension affecting pregnancy 02/06/2022   Advanced maternal age, primigravida 02/06/2022   Pre-existing type 2 diabetes mellitus in pregnancy 02/06/2022   Depression with anxiety 03/18/2012   DM (diabetes mellitus), type 2, uncontrolled 05/06/2008   Morbid obesity ([place]) 05/06/2008   Essential hypertension 05/06/2008    Assessment/Plan:  Krista Adams is a 36 y.o. G1P0000 at [redacted]w[redacted]d here for IOL due to uncontrolled type II diabetes and cHTN.   #Labor: Discussed methods of labor induction. Initial cervical exam as above. Dose of cytotec 50/25 given. Consider FB vs repeat cytotec dose vs pitocin next check.  #Pain: Family Support. Planning on eventual epidural. #FWB: Cat I #ID:  GBS (-) #MOF: Breast #MOC:ppIUD #Circ:  N/A  #cHTN: Patient has been  hypertensive on admission, 150's/60's. Continue labetolol 300mg  BID. [place] labs negative. No evidence of pre-eclampsia.   #DMII: Patient is on glargine 65 units AM and 60 units PM at home. While in labor will continue glargine 65 units AM only. Monitor CBG every 4 hours.   Apolonio Schneiders, MD  Resident Physician  08/15/2022, 9:35 AM  Attestation of Supervision of Student:  I confirm that I have verified the information documented in the  resident  student's note and that I have also personally reperformed the history, physical exam and all medical decision making activities.  I have verified that all services and findings are accurately documented in this student's note; and I agree with management and plan as outlined in the documentation. I have also made any necessary editorial changes.   Starr Lake, [place] for [employer], [employer] 08/15/2022 1:32 PM

## 2022-08-15 NOTE — Progress Notes (Signed)
Medicated for severe range pressures.  Protocol started.  BP q68mn.

## 2022-08-15 NOTE — Progress Notes (Addendum)
Patient ID: Krista Adams, female   DOB: 1986-01-13, 36 y.o.   MRN: [id number]  S/p cytotec x 3 doses; feeling a little crampy but has been pre-medicated with Fentanyl 146mg; on mag sulfate; no s/s pre-e  BPs 152/76, 146/62 FHR 120s, decreased LTV, some 10x10 accels, no decels Ctx irreg Cx 1/thick/vtx -2, soft and mid position  CBG: 116  IUP'@37'$ .0wks T2DM (Lantus 65u q am + SS) cHTN w SIPE (Lab '300mg'$  bid) Cx unfavorable  Cervical foley inserted without difficulty and inflated with 40cc Cytotec 59m buccal given (4th dose); plan for Pit most likely when foley is dislodged  Krista Adams 08/15/2022 10:02 PM

## 2022-08-15 NOTE — Progress Notes (Signed)
Labor Progress Note Krista Adams is a 36 y.o. G1P0000 at 75w0dpresented for IOL 2/2 uncontrolled Type II diabetes and cHTN  S: SCecilieis doing well. Started to feel some contractions.   O:  BP (!) 158/71   Pulse 63   Temp 97.9 F (36.6 C) (Oral)   Resp 16   Ht '5\' 4"'$  (1.626 m)   Wt 133.7 kg   LMP  (LMP Unknown)   BMI 50.60 kg/m  EFM: 130/moderate variability/(+) acels, no decelerations  CVE: Dilation: 1.5 Effacement (%): Thick Cervical Position: Posterior Station: -3 Presentation: Vertex Exam by:: Dr. CAlease Medina  A&P: 36y.o. G1P0000 353w0ddmitted to L&D for IOL 2/2 uncontrolled DMII #Labor: Progressing well. Cervical exam very difficult due to patient tolerance as well as posterior presentation. Given 2nd dose of 5072mPO Cytotec. Will continue to monitor #Pain: Family support. Plans for epidural.  #FWB: Cat I tracing #GBS negative  #cHTN: Patient has been hypertensive on admission, 150's/60's. Continue labetolol '300mg'$  BID. [place]bs negative. No evidence of pre-eclampsia.    #DMII: Patient is on glargine 65 units AM and 60 units PM at home. While in labor will continue glargine 65 units AM only. Patient also on sliding scale.  Monitor CBG every 4 hours.   Aren Pryde L CAlferd ApaD 1:39 PM

## 2022-08-16 ENCOUNTER — Encounter (HOSPITAL_COMMUNITY): Payer: Self-pay | Admitting: Family Medicine

## 2022-08-16 ENCOUNTER — Ambulatory Visit: Payer: BC Managed Care – PPO

## 2022-08-16 LAB — CBC
HCT: 33.5 % — ABNORMAL LOW (ref 36.0–46.0)
Hemoglobin: 11.2 g/dL — ABNORMAL LOW (ref 12.0–15.0)
MCH: 29.2 pg (ref 26.0–34.0)
MCHC: 33.4 g/dL (ref 30.0–36.0)
MCV: 87.2 fL (ref 80.0–100.0)
Platelets: 219 10*3/uL (ref 150–400)
RBC: 3.84 MIL/uL — ABNORMAL LOW (ref 3.87–5.11)
RDW: 13.6 % (ref 11.5–15.5)
WBC: 5.2 10*3/uL (ref 4.0–10.5)
nRBC: 0 % (ref 0.0–0.2)

## 2022-08-16 LAB — GLUCOSE, CAPILLARY
Glucose-Capillary: 102 mg/dL — ABNORMAL HIGH (ref 70–99)
Glucose-Capillary: 116 mg/dL — ABNORMAL HIGH (ref 70–99)
Glucose-Capillary: 124 mg/dL — ABNORMAL HIGH (ref 70–99)
Glucose-Capillary: 130 mg/dL — ABNORMAL HIGH (ref 70–99)
Glucose-Capillary: 135 mg/dL — ABNORMAL HIGH (ref 70–99)
Glucose-Capillary: 144 mg/dL — ABNORMAL HIGH (ref 70–99)
Glucose-Capillary: 163 mg/dL — ABNORMAL HIGH (ref 70–99)
Glucose-Capillary: 83 mg/dL (ref 70–99)

## 2022-08-16 MED ORDER — EPHEDRINE 5 MG/ML INJ: 10.0000 mg | INTRAVENOUS | Status: DC | PRN

## 2022-08-16 MED ORDER — MISOPROSTOL 25 MCG QUARTER TABLET
25.0000 ug | ORAL_TABLET | Freq: Once | ORAL | Status: AC
  Administered 2022-08-16: 25 ug via VAGINAL
  Filled 2022-08-16: qty 1

## 2022-08-16 MED ORDER — OXYTOCIN-SODIUM CHLORIDE 30-0.9 UT/500ML-% IV SOLN
1.0000 m[IU]/min | INTRAVENOUS | Status: DC
  Administered 2022-08-16 – 2022-08-17 (×2): 2 m[IU]/min via INTRAVENOUS
  Administered 2022-08-17: 6 m[IU]/min via INTRAVENOUS
  Filled 2022-08-16: qty 500

## 2022-08-16 MED ORDER — INSULIN ASPART 100 UNIT/ML IJ SOLN
0.0000 [IU] | INTRAMUSCULAR | Status: DC
  Administered 2022-08-16 – 2022-08-17 (×3): 2 [IU] via SUBCUTANEOUS
  Administered 2022-08-17: 3 [IU] via SUBCUTANEOUS

## 2022-08-16 MED ORDER — LACTATED RINGERS IV SOLN: 500.0000 mL | Freq: Once | INTRAVENOUS | Status: DC

## 2022-08-16 MED ORDER — PHENYLEPHRINE 80 MCG/ML (10ML) SYRINGE FOR IV PUSH (FOR BLOOD PRESSURE SUPPORT)
80.0000 ug | PREFILLED_SYRINGE | INTRAVENOUS | Status: DC | PRN
  Administered 2022-08-17 (×2): 80 ug via INTRAVENOUS

## 2022-08-16 MED ORDER — INSULIN GLARGINE 100 UNIT/ML ~~LOC~~ SOLN
30.0000 [IU] | Freq: Once | SUBCUTANEOUS | Status: AC
  Administered 2022-08-16: 30 [IU] via SUBCUTANEOUS
  Filled 2022-08-16 (×2): qty 0.3

## 2022-08-16 MED ORDER — FENTANYL-BUPIVACAINE-NACL 0.5-0.125-0.9 MG/250ML-% EP SOLN
12.0000 mL/h | EPIDURAL | Status: DC | PRN
  Filled 2022-08-16 (×2): qty 250

## 2022-08-16 MED ORDER — PHENYLEPHRINE 80 MCG/ML (10ML) SYRINGE FOR IV PUSH (FOR BLOOD PRESSURE SUPPORT)
80.0000 ug | PREFILLED_SYRINGE | INTRAVENOUS | Status: DC | PRN
  Filled 2022-08-16: qty 10

## 2022-08-16 MED ORDER — INSULIN GLARGINE-YFGN 100 UNIT/ML ~~LOC~~ SOLN: 30.0000 [IU] | Freq: Once | SUBCUTANEOUS | Status: DC

## 2022-08-16 MED ORDER — DIPHENHYDRAMINE HCL 50 MG/ML IJ SOLN: 12.5000 mg | INTRAMUSCULAR | Status: DC | PRN

## 2022-08-16 MED ORDER — TERBUTALINE SULFATE 1 MG/ML IJ SOLN: 0.2500 mg | Freq: Once | INTRAMUSCULAR | Status: DC | PRN

## 2022-08-16 NOTE — Progress Notes (Signed)
Patient ID: FRANKI ALCAIDE, female   DOB: 1986/05/14, 36 y.o.   MRN: [id number]  S/p cytotec x 4 and foley has been dislodged; on mag sulfate  BPs 135/53, 155/56, 142/50 FHR 120-130, decreased variability, no decels Ctx irreg Cx 1/50/vtx -2  IUP'@37'$ .1wk T2DM cHTN w SIPE Cx unfavorable  CBG at 0058: 163 (given SS 3u and Lantus 30u)   Cervical foley wasn't in the os so no appreciable change; will repeat a double cytotec dose now (5mg vag/535m oral); pt is agreeable to trying the balloon again ~0600 with pre-medication.  KiMIREYA MEDITZNM 08/16/2022

## 2022-08-16 NOTE — Progress Notes (Signed)
Patient ID: Krista Adams, female   DOB: [birth year], 36 y.o.   MRN: [id number]   BP (!) 134/58   Pulse (!) 53   Temp 98.4 F (36.9 C) (Oral)   Resp 18   Ht '5\' 4"'$  (1.626 m)   Wt 133.7 kg   LMP  (LMP Unknown)   BMI 50.60 kg/m    S/p 4 doses of cytotec, feeling contractions now, stronger and more frequent. Continues with mag sulfate.  CBG: 102.    FHR: 115 bpm, moderate variability, some accels, no decels.  Irregular contractions per patient, not easily traced. SVE: 1-2/60/-2   IUP'@37'$ .0wks T2DM (Lantus 65u q am + SS) cHTN w SIPE (Lab '300mg'$  bid  Cervical foley inserted and inflated with 50 cc Will hold Cytotec and plan for pitocin when foley is out.   Hollace Hayward, Student-MidWife

## 2022-08-16 NOTE — Progress Notes (Signed)
Labor Progress Note Krista Adams is a 35 y.o. G1P0000 at 82w0dpresented for IOL 2/2 uncontrolled Type II diabetes and cHTN  S: SLormais doing well. Not feeling as many contractions.   O:  BP (!) 127/56   Pulse 61   Temp 98.4 F (36.9 C) (Oral)   Resp 17   Ht '5\' 4"'$  (1.626 m)   Wt 133.7 kg   LMP  (LMP Unknown)   BMI 50.60 kg/m  EFM: 120/moderate variability/no acels, no decelerations  CVE: Dilation: 4 Effacement (%): 60 Cervical Position: Posterior Station: -2 Presentation: Vertex Exam by:: Dr. CAlease Medina  A&P: 36y.o. G1P0000 351w0ddmitted to L&D for IOL 2/2 uncontrolled DMII  #Labor: Progressing well. FB out. Will start pitocin 2x2. Consider AROM next check.  #Pain: Family support. Plans for epidural.  #FWB: Cat I tracing #GBS negative  #cHTN: Patient has been hypertensive on admission, 150's/60's. Continue labetolol '300mg'$  BID. PIMedinaabs negative. Patient had severe range blood pressures, continue magnesium.    #DMII: Patient is on glargine 65 units AM and 60 units PM at home. While in labor will continue glargine 65 units AM only. Patient also on sliding scale.  Monitor CBG every 4 hours.   CoApolonio SchneidersMD Resident Physician 08/16/2022 3:10 PM

## 2022-08-17 ENCOUNTER — Inpatient Hospital Stay (HOSPITAL_COMMUNITY): Payer: BC Managed Care – PPO | Admitting: Anesthesiology

## 2022-08-17 LAB — COMPREHENSIVE METABOLIC PANEL
ALT: 15 U/L (ref 0–44)
AST: 27 U/L (ref 15–41)
Albumin: 2.5 g/dL — ABNORMAL LOW (ref 3.5–5.0)
Alkaline Phosphatase: 80 U/L (ref 38–126)
Anion gap: 8 (ref 5–15)
BUN: 9 mg/dL (ref 6–20)
CO2: 21 mmol/L — ABNORMAL LOW (ref 22–32)
Calcium: 7.8 mg/dL — ABNORMAL LOW (ref 8.9–10.3)
Chloride: 104 mmol/L (ref 98–111)
Creatinine, Ser: 0.75 mg/dL (ref 0.44–1.00)
GFR, Estimated: >60 mL/min (ref >60)
Glucose, Bld: 161 mg/dL — ABNORMAL HIGH (ref 70–99)
Potassium: 4.4 mmol/L (ref 3.5–5.1)
Sodium: 133 mmol/L — ABNORMAL LOW (ref 135–145)
Total Bilirubin: 0.4 mg/dL (ref 0.3–1.2)
Total Protein: 6.2 g/dL — ABNORMAL LOW (ref 6.5–8.1)

## 2022-08-17 LAB — GLUCOSE, CAPILLARY
Glucose-Capillary: 98 mg/dL (ref 70–99)
Glucose-Capillary: 77 mg/dL (ref 70–99)
Glucose-Capillary: 132 mg/dL — ABNORMAL HIGH (ref 70–99)
Glucose-Capillary: 140 mg/dL — ABNORMAL HIGH (ref 70–99)
Glucose-Capillary: 162 mg/dL — ABNORMAL HIGH (ref 70–99)
Glucose-Capillary: 89 mg/dL (ref 70–99)
Glucose-Capillary: 81 mg/dL (ref 70–99)

## 2022-08-17 LAB — CBC
HCT: 34 % — ABNORMAL LOW (ref 36.0–46.0)
Hemoglobin: 11.1 g/dL — ABNORMAL LOW (ref 12.0–15.0)
MCH: 28.7 pg (ref 26.0–34.0)
MCHC: 32.6 g/dL (ref 30.0–36.0)
MCV: 87.9 fL (ref 80.0–100.0)
Platelets: 214 10*3/uL (ref 150–400)
RBC: 3.87 MIL/uL (ref 3.87–5.11)
RDW: 13.5 % (ref 11.5–15.5)
WBC: 5.1 10*3/uL (ref 4.0–10.5)
nRBC: 0 % (ref 0.0–0.2)

## 2022-08-17 LAB — MAGNESIUM: Magnesium: 4.9 mg/dL — ABNORMAL HIGH (ref 1.7–2.4)

## 2022-08-17 MED ORDER — MISOPROSTOL 25 MCG QUARTER TABLET
25.0000 ug | ORAL_TABLET | Freq: Once | ORAL | Status: AC
  Administered 2022-08-17: 25 ug via VAGINAL
  Filled 2022-08-17: qty 1

## 2022-08-17 MED ORDER — FENTANYL-BUPIVACAINE-NACL 0.5-0.125-0.9 MG/250ML-% EP SOLN
EPIDURAL | Status: DC | PRN
  Administered 2022-08-17: 12 mL/h via EPIDURAL
  Administered 2022-08-18: 500 ug via EPIDURAL

## 2022-08-17 MED ORDER — LIDOCAINE HCL (PF) 1 % IJ SOLN
INTRAMUSCULAR | Status: DC | PRN
  Administered 2022-08-17: 5 mL via EPIDURAL

## 2022-08-17 MED ORDER — MISOPROSTOL 25 MCG QUARTER TABLET
25.0000 ug | ORAL_TABLET | Freq: Once | ORAL | Status: AC
  Administered 2022-08-17: 25 ug via ORAL
  Filled 2022-08-17: qty 1

## 2022-08-17 MED ORDER — TERBUTALINE SULFATE 1 MG/ML IJ SOLN: 0.2500 mg | Freq: Once | INTRAMUSCULAR | Status: DC | PRN

## 2022-08-17 NOTE — Progress Notes (Signed)
Late note: Pt comfortable with contractions at the time of my exam.  FHT 120, min-mod variability on magnesium, + accels, no decels Toco not tracing CE: 4/70/-3, head well applied. AROM to clear fluid. IUPC placed easily. Attempted FSE placement but unable to place at that angle.  Plan:  - FHT reassuring. Continue pitocin. May attempt FSE at next check.  - Epidural when desired.  - Continue Magnesium and intermittent labs (Q12 hours).  - Anticipate SVD.  - Continue CBG checks.  - GBS neg  Radene Gunning, MD Attending [place], [facility] for [facility], [place]

## 2022-08-17 NOTE — Progress Notes (Signed)
LABOR PROGRESS NOTE  Krista Adams is a 36 y.o. G1P0000 at [redacted]w[redacted]d admitted for IOL for preeclampsia and diabetes   Subjective: Is doing well but discouraged from lack of progress.   Objective: BP (!) 151/80   Pulse (!) 55   Temp 98.5 F (36.9 C) (Oral)   Resp 18   Ht '5\' 4"'$  (1.626 m)   Wt 133.7 kg   LMP  (LMP Unknown)   BMI 50.60 kg/m  or  Vitals:   08/17/22 1230 08/17/22 1401 08/17/22 1430 08/17/22 1500  BP: (!) 160/61 (!) 142/75 (!) 140/86 (!) 151/80  Pulse: 60 (!) 58 (!) 58 (!) 55  Resp:  '20 18 18  '$ Temp:      TempSrc:      Weight:      Height:        Dilation: 4 Effacement (%): 70 Cervical Position: Posterior Station: Ballotable Presentation: Vertex Exam by:: Dr. CCaron PresumeFHT: baseline rate 120, moderate varibility, + acel, no decel Toco: difficult tracing   Labs: Lab Results  Component Value Date   WBC 5.2 08/16/2022   HGB 11.2 (L) 08/16/2022   HCT 33.5 (L) 08/16/2022   MCV 87.2 08/16/2022   PLT 219 08/16/2022    Patient Active Problem List   Diagnosis Date Noted   Chronic hypertension affecting pregnancy 08/15/2022   Chronic hypertension with superimposed pre-eclampsia 08/15/2022   BMI 50.0-59.9, adult ([place] 07/26/2022   Diabetes mellitus ([place] 034/91/7915  Systolic murmur 005/69/7948  Supervision of high risk pregnancy, antepartum 06/11/2022   Hypertension affecting pregnancy 02/06/2022   Advanced maternal age, primigravida 02/06/2022   Pre-existing type 2 diabetes mellitus in pregnancy 02/06/2022   Depression with anxiety 03/18/2012   DM (diabetes mellitus), type 2, uncontrolled 05/06/2008   Morbid obesity ([place] 05/06/2008   Essential hypertension 05/06/2008    Assessment / Plan: 36y.o. G1P0000 at 367w2dere for IOL for Pre E and DM   Labor: Slow progression, will titrate pit and recheck in 4 hours, consider AROM if able at next check  Fetal Wellbeing:  Cat 1 Pain Control:  PRN epidural  Anticipated MOD:  guarded  ViGifford ShaveMD   08/17/2022, 3:26 PM

## 2022-08-17 NOTE — Anesthesia Procedure Notes (Signed)
Epidural Patient location during procedure: OB Start time: 08/17/2022 6:43 PM End time: 08/17/2022 7:02 PM  Staffing Anesthesiologist: Barnet Glasgow, MD Performed: anesthesiologist   Preanesthetic Checklist Completed: patient identified, IV checked, site marked, risks and benefits discussed, surgical consent, monitors and equipment checked, pre-op evaluation and timeout performed  Epidural Patient position: sitting Prep: DuraPrep and site prepped and draped Patient monitoring: continuous pulse ox and blood pressure Approach: midline Injection technique: LOR air  Needle:  Needle type: Tuohy  Needle gauge: 17 G Needle length: 9 cm and 9 Needle insertion depth: 8 cm Catheter type: closed end flexible Catheter size: 19 Gauge Catheter at skin depth: 15 cm Test dose: negative  Assessment Events: blood not aspirated, injection not painful, no injection resistance, no paresthesia and negative IV test  Additional Notes Patient identified. Risks/Benefits/Options discussed with patient including but not limited to bleeding, infection, nerve damage, paralysis, failed block, incomplete pain control, headache, blood pressure changes, nausea, vomiting, reactions to medication both or allergic, itching and postpartum back pain. Confirmed with bedside nurse the patient's most recent platelet count. Confirmed with patient that they are not currently taking any anticoagulation, have any bleeding history or any family history of bleeding disorders. Patient expressed understanding and wished to proceed. All questions were answered. Sterile technique was used throughout the entire procedure. Please see nursing notes for vital signs. Test dose was given through epidural needle and negative prior to continuing to dose epidural or start infusion. Warning signs of high block given to the patient including shortness of breath, tingling/numbness in hands, complete motor block, or any concerning symptoms with  instructions to call for help. Patient was given instructions on fall risk and not to get out of bed. All questions and concerns addressed with instructions to call with any issues.  1 Attempt (S) . Patient tolerated procedure well.

## 2022-08-17 NOTE — Anesthesia Preprocedure Evaluation (Signed)
Anesthesia Evaluation  Patient identified by MRN, date of birth, ID band Patient awake    Reviewed: Allergy & Precautions, NPO status , Patient's Chart, lab work & pertinent test results  Airway Mallampati: III  TM Distance: >3 FB Neck ROM: Full    Dental no notable dental hx. (+) Teeth Intact, Dental Advisory Given   Pulmonary former smoker,    Pulmonary exam normal breath sounds clear to auscultation       Cardiovascular hypertension (pre E cHtn on Mg++), Normal cardiovascular exam Rhythm:Regular Rate:Normal     Neuro/Psych    GI/Hepatic negative GI ROS, Neg liver ROS,   Endo/Other  diabetes, Type 2Morbid obesity  Renal/GU      Musculoskeletal   Abdominal   Peds  Hematology Lab Results      Component                Value               Date                      WBC                      5.1                 08/17/2022                HGB                      11.1 (L)            08/17/2022                HCT                      34.0 (L)            08/17/2022                MCV                      87.9                08/17/2022                PLT                      214                 08/17/2022              Anesthesia Other Findings   Reproductive/Obstetrics (+) Pregnancy                             Anesthesia Physical Anesthesia Plan  ASA: 3  Anesthesia Plan: Epidural   Post-op Pain Management:    Induction:   PONV Risk Score and Plan:   Airway Management Planned:   Additional Equipment:   Intra-op Plan:   Post-operative Plan:   Informed Consent: I have reviewed the patients History and Physical, chart, labs and discussed the procedure including the risks, benefits and alternatives for the proposed anesthesia with the patient or authorized representative who has indicated his/her understanding and acceptance.       Plan Discussed with:   Anesthesia Plan Comments:  (37.2 Wk primagravida w cHtn pre E DM@ on  Mg++ for LEA)        Anesthesia Quick Evaluation

## 2022-08-17 NOTE — Progress Notes (Signed)
Krista Adams is a 36 y.o. G1P0000 at 39w2dby LMP admitted for induction of labor due to Diabetes and Pre-eclamptic toxemia of pregnancy..  Subjective:  Feeling overwhelmed, uncomfortable but not feeling contractions.   Objective: BP (!) 126/57   Pulse 61   Temp 98.5 F (36.9 C) (Oral)   Resp 17   Ht '5\' 4"'$  (1.626 m)   Wt 133.7 kg   LMP  (LMP Unknown)   BMI 50.60 kg/m  I/O last 3 completed shifts: In: 3084.8 [P.O.:515; I.V.:2569.8] Out: 4090 [Urine:4090] Total I/O In: 791.1 [P.O.:240; I.V.:551.1] Out: 1100 [Urine:1100]  FHT:  FHR: 120 bpm, variability: moderate,  accelerations:  Present,  decelerations:  Absent UC:   irregular, every 3-5 minutes SVE:   Dilation: 4 Effacement (%): 60 Station: -2 Exam by:: KSerena Croissant CNM   Labs: Lab Results  Component Value Date   WBC 5.2 08/16/2022   HGB 11.2 (L) 08/16/2022   HCT 33.5 (L) 08/16/2022   MCV 87.2 08/16/2022   PLT 219 08/16/2022    Assessment / Plan: Induction of labor due to preeclampsia.  Labor:  No change in cervical exam,   will stop pitocin and plan for cytotec '25mg'$  vaginally and  '25mg'$  oral.  Preeclampsia:  on magnesium sulfate, BP moderate range. Fetal Wellbeing:  Category I Pain Control:  IV pain meds I/D:  n/a Anticipated MOD:  NSVD  KHollace Hayward Student-MidWife 08/17/2022, 12:51 AM

## 2022-08-18 ENCOUNTER — Encounter (HOSPITAL_COMMUNITY)
Admission: AD | Disposition: A | Discharge status: Home / Self Care | Payer: Self-pay | Source: Home / Self Care | Attending: Obstetrics & Gynecology

## 2022-08-18 ENCOUNTER — Other Ambulatory Visit: Payer: Self-pay

## 2022-08-18 DIAGNOSIS — O99214 Obesity complicating childbirth: Secondary | ICD-10-CM

## 2022-08-18 DIAGNOSIS — O09523 Supervision of elderly multigravida, third trimester: Secondary | ICD-10-CM

## 2022-08-18 DIAGNOSIS — O1414 Severe pre-eclampsia complicating childbirth: Secondary | ICD-10-CM

## 2022-08-18 DIAGNOSIS — O411230 Chorioamnionitis, third trimester, not applicable or unspecified: Secondary | ICD-10-CM

## 2022-08-18 DIAGNOSIS — Z3A37 37 weeks gestation of pregnancy: Secondary | ICD-10-CM

## 2022-08-18 DIAGNOSIS — O2412 Pre-existing diabetes mellitus, type 2, in childbirth: Secondary | ICD-10-CM

## 2022-08-18 LAB — CBC
HCT: 34.3 % — ABNORMAL LOW (ref 36.0–46.0)
HCT: 30.1 % — ABNORMAL LOW (ref 36.0–46.0)
Hemoglobin: 11.6 g/dL — ABNORMAL LOW (ref 12.0–15.0)
Hemoglobin: 10.4 g/dL — ABNORMAL LOW (ref 12.0–15.0)
MCH: 29.6 pg (ref 26.0–34.0)
MCH: 29.5 pg (ref 26.0–34.0)
MCHC: 33.8 g/dL (ref 30.0–36.0)
MCHC: 34.6 g/dL (ref 30.0–36.0)
MCV: 87.5 fL (ref 80.0–100.0)
MCV: 85.5 fL (ref 80.0–100.0)
Platelets: 192 10*3/uL (ref 150–400)
Platelets: 200 10*3/uL (ref 150–400)
RBC: 3.92 MIL/uL (ref 3.87–5.11)
RBC: 3.52 MIL/uL — ABNORMAL LOW (ref 3.87–5.11)
RDW: 13.7 % (ref 11.5–15.5)
RDW: 13.4 % (ref 11.5–15.5)
WBC: 7.4 10*3/uL (ref 4.0–10.5)
WBC: 13.4 10*3/uL — ABNORMAL HIGH (ref 4.0–10.5)
nRBC: 0 % (ref 0.0–0.2)
nRBC: 0 % (ref 0.0–0.2)

## 2022-08-18 LAB — COMPREHENSIVE METABOLIC PANEL
ALT: 18 U/L (ref 0–44)
AST: 41 U/L (ref 15–41)
Albumin: 2.3 g/dL — ABNORMAL LOW (ref 3.5–5.0)
Alkaline Phosphatase: 80 U/L (ref 38–126)
Anion gap: 11 (ref 5–15)
BUN: 8 mg/dL (ref 6–20)
CO2: 19 mmol/L — ABNORMAL LOW (ref 22–32)
Calcium: 7.4 mg/dL — ABNORMAL LOW (ref 8.9–10.3)
Chloride: 100 mmol/L (ref 98–111)
Creatinine, Ser: 0.83 mg/dL (ref 0.44–1.00)
GFR, Estimated: >60 mL/min (ref >60)
Glucose, Bld: 72 mg/dL (ref 70–99)
Potassium: 4.9 mmol/L (ref 3.5–5.1)
Sodium: 130 mmol/L — ABNORMAL LOW (ref 135–145)
Total Bilirubin: 1.2 mg/dL (ref 0.3–1.2)
Total Protein: 5.9 g/dL — ABNORMAL LOW (ref 6.5–8.1)

## 2022-08-18 LAB — MAGNESIUM: Magnesium: 5.2 mg/dL — ABNORMAL HIGH (ref 1.7–2.4)

## 2022-08-18 LAB — GLUCOSE, CAPILLARY
Glucose-Capillary: 125 mg/dL — ABNORMAL HIGH (ref 70–99)
Glucose-Capillary: 48 mg/dL — ABNORMAL LOW (ref 70–99)
Glucose-Capillary: 54 mg/dL — ABNORMAL LOW (ref 70–99)
Glucose-Capillary: 73 mg/dL (ref 70–99)
Glucose-Capillary: 79 mg/dL (ref 70–99)
Glucose-Capillary: 81 mg/dL (ref 70–99)
Glucose-Capillary: 95 mg/dL (ref 70–99)
Glucose-Capillary: 95 mg/dL (ref 70–99)

## 2022-08-18 SURGERY — Surgical Case
Anesthesia: Epidural | Site: Abdomen | Wound class: Clean Contaminated

## 2022-08-18 MED ORDER — SIMETHICONE 80 MG PO CHEW: 80.0000 mg | CHEWABLE_TABLET | ORAL | Status: DC | PRN

## 2022-08-18 MED ORDER — GENTAMICIN SULFATE 40 MG/ML IJ SOLN
5.0000 mg/kg | INTRAVENOUS | Status: DC
  Administered 2022-08-18 (×2): 430 mg via INTRAVENOUS
  Filled 2022-08-18: qty 10.75

## 2022-08-18 MED ORDER — ONDANSETRON HCL 4 MG/2ML IJ SOLN
INTRAMUSCULAR | Status: DC | PRN
  Administered 2022-08-18: 4 mg via INTRAVENOUS

## 2022-08-18 MED ORDER — ACETAMINOPHEN 10 MG/ML IV SOLN
INTRAVENOUS | Status: AC
  Filled 2022-08-18: qty 100

## 2022-08-18 MED ORDER — OXYTOCIN-SODIUM CHLORIDE 30-0.9 UT/500ML-% IV SOLN: 2.5000 [IU]/h | INTRAVENOUS | Status: AC

## 2022-08-18 MED ORDER — MORPHINE SULFATE (PF) 0.5 MG/ML IJ SOLN
INTRAMUSCULAR | Status: AC
  Filled 2022-08-18: qty 10

## 2022-08-18 MED ORDER — WITCH HAZEL-GLYCERIN EX PADS: 1.0000 | MEDICATED_PAD | CUTANEOUS | Status: DC | PRN

## 2022-08-18 MED ORDER — SODIUM CHLORIDE 0.9 % IV SOLN
INTRAVENOUS | Status: AC
  Filled 2022-08-18: qty 5

## 2022-08-18 MED ORDER — LIDOCAINE-EPINEPHRINE (PF) 2 %-1:200000 IJ SOLN
INTRAMUSCULAR | Status: DC | PRN
  Administered 2022-08-18 (×3): 5 mL via EPIDURAL

## 2022-08-18 MED ORDER — DEXAMETHASONE SODIUM PHOSPHATE 10 MG/ML IJ SOLN
INTRAMUSCULAR | Status: DC | PRN
  Administered 2022-08-18: 4 mg via INTRAVENOUS

## 2022-08-18 MED ORDER — MENTHOL 3 MG MT LOZG: 1.0000 | LOZENGE | OROMUCOSAL | Status: DC | PRN

## 2022-08-18 MED ORDER — DIPHENHYDRAMINE HCL 25 MG PO CAPS
25.0000 mg | ORAL_CAPSULE | Freq: Four times a day (QID) | ORAL | Status: DC | PRN
  Administered 2022-08-19 (×2): 25 mg via ORAL
  Filled 2022-08-18 (×2): qty 1

## 2022-08-18 MED ORDER — SODIUM CHLORIDE 0.9 % IV SOLN
2.0000 g | Freq: Four times a day (QID) | INTRAVENOUS | Status: DC
  Administered 2022-08-18: 2 g via INTRAVENOUS
  Filled 2022-08-18: qty 2000

## 2022-08-18 MED ORDER — TRANEXAMIC ACID-NACL 1000-0.7 MG/100ML-% IV SOLN
INTRAVENOUS | Status: AC
  Filled 2022-08-18: qty 100

## 2022-08-18 MED ORDER — PRENATAL MULTIVITAMIN CH
1.0000 | ORAL_TABLET | Freq: Every day | ORAL | Status: DC
  Administered 2022-08-19 – 2022-08-21 (×3): 1 via ORAL
  Filled 2022-08-18 (×3): qty 1

## 2022-08-18 MED ORDER — TRANEXAMIC ACID-NACL 1000-0.7 MG/100ML-% IV SOLN
INTRAVENOUS | Status: DC | PRN
  Administered 2022-08-18: 1000 mg via INTRAVENOUS

## 2022-08-18 MED ORDER — OXYTOCIN-SODIUM CHLORIDE 30-0.9 UT/500ML-% IV SOLN
INTRAVENOUS | Status: DC | PRN
  Administered 2022-08-18: 250 mL/h via INTRAVENOUS

## 2022-08-18 MED ORDER — SIMETHICONE 80 MG PO CHEW
80.0000 mg | CHEWABLE_TABLET | Freq: Three times a day (TID) | ORAL | Status: DC
  Administered 2022-08-19 – 2022-08-21 (×8): 80 mg via ORAL
  Filled 2022-08-18 (×8): qty 1

## 2022-08-18 MED ORDER — TETANUS-DIPHTH-ACELL PERTUSSIS 5-2.5-18.5 LF-MCG/0.5 IM SUSY: 0.5000 mL | PREFILLED_SYRINGE | Freq: Once | INTRAMUSCULAR | Status: DC

## 2022-08-18 MED ORDER — STERILE WATER FOR IRRIGATION IR SOLN
Status: DC | PRN
  Administered 2022-08-18: 1

## 2022-08-18 MED ORDER — COCONUT OIL OIL
1.0000 | TOPICAL_OIL | Status: DC | PRN
  Administered 2022-08-19: 1 via TOPICAL

## 2022-08-18 MED ORDER — ENOXAPARIN SODIUM 40 MG/0.4ML IJ SOSY: 40.0000 mg | PREFILLED_SYRINGE | INTRAMUSCULAR | Status: DC

## 2022-08-18 MED ORDER — METRONIDAZOLE 500 MG/100ML IV SOLN
500.0000 mg | Freq: Two times a day (BID) | INTRAVENOUS | Status: DC
  Administered 2022-08-18: 500 mg via INTRAVENOUS
  Filled 2022-08-18 (×2): qty 100

## 2022-08-18 MED ORDER — ACETAMINOPHEN 10 MG/ML IV SOLN
INTRAVENOUS | Status: DC | PRN
  Administered 2022-08-18: 1000 mg via INTRAVENOUS

## 2022-08-18 MED ORDER — SODIUM CHLORIDE 0.9 % IR SOLN
Status: DC | PRN
  Administered 2022-08-18: 1

## 2022-08-18 MED ORDER — OXYCODONE HCL 5 MG PO TABS
5.0000 mg | ORAL_TABLET | ORAL | Status: DC | PRN
  Administered 2022-08-19 (×2): 10 mg via ORAL
  Administered 2022-08-20: 5 mg via ORAL
  Administered 2022-08-20 – 2022-08-21 (×3): 10 mg via ORAL
  Filled 2022-08-18: qty 1
  Filled 2022-08-18 (×5): qty 2

## 2022-08-18 MED ORDER — DIBUCAINE (PERIANAL) 1 % EX OINT: 1.0000 | TOPICAL_OINTMENT | CUTANEOUS | Status: DC | PRN

## 2022-08-18 MED ORDER — SENNOSIDES-DOCUSATE SODIUM 8.6-50 MG PO TABS: 2.0000 | ORAL_TABLET | Freq: Every day | ORAL | Status: DC

## 2022-08-18 MED ORDER — ZOLPIDEM TARTRATE 5 MG PO TABS: 5.0000 mg | ORAL_TABLET | Freq: Every evening | ORAL | Status: DC | PRN

## 2022-08-18 MED ORDER — DEXAMETHASONE SODIUM PHOSPHATE 10 MG/ML IJ SOLN
INTRAMUSCULAR | Status: AC
  Filled 2022-08-18: qty 1

## 2022-08-18 MED ORDER — SODIUM CHLORIDE 0.9 % IV SOLN
500.0000 mg | Freq: Once | INTRAVENOUS | Status: AC
  Administered 2022-08-18: 500 mg via INTRAVENOUS

## 2022-08-18 MED ORDER — MORPHINE SULFATE (PF) 0.5 MG/ML IJ SOLN
INTRAMUSCULAR | Status: DC | PRN
  Administered 2022-08-18: 3 mg via EPIDURAL

## 2022-08-18 MED ORDER — ACETAMINOPHEN 500 MG PO TABS
1000.0000 mg | ORAL_TABLET | Freq: Four times a day (QID) | ORAL | Status: DC
  Administered 2022-08-19 – 2022-08-21 (×10): 1000 mg via ORAL
  Filled 2022-08-18 (×11): qty 2

## 2022-08-18 SURGICAL SUPPLY — 40 items
APL SKNCLS STERI-STRIP NONHPOA (GAUZE/BANDAGES/DRESSINGS) ×1
BENZOIN TINCTURE PRP APPL 2/3 (GAUZE/BANDAGES/DRESSINGS) ×2 IMPLANT
CANISTER PREVENA PLUS 150 (CANNISTER) IMPLANT
CANISTER SUCT 3000ML PPV (MISCELLANEOUS) ×2 IMPLANT
CELLS DAT CNTRL 66122 CELL SVR (MISCELLANEOUS) ×1 IMPLANT
CHLORAPREP W/TINT 26ML (MISCELLANEOUS) ×4 IMPLANT
CLAMP UMBILICAL CORD (MISCELLANEOUS) ×2 IMPLANT
DRESSING PREVENA PLUS CUSTOM (GAUZE/BANDAGES/DRESSINGS) IMPLANT
DRSG OPSITE POSTOP 4X10 (GAUZE/BANDAGES/DRESSINGS) ×2 IMPLANT
DRSG PREVENA PLUS CUSTOM (GAUZE/BANDAGES/DRESSINGS) ×1
ELECT REM PT RETURN 9FT ADLT (ELECTROSURGICAL) ×1
ELECTRODE REM PT RTRN 9FT ADLT (ELECTROSURGICAL) ×2 IMPLANT
EXTRACTOR VACUUM KIWI (MISCELLANEOUS) ×2 IMPLANT
GLOVE BIOGEL PI IND STRL 7.0 (GLOVE) ×4 IMPLANT
GLOVE BIOGEL PI IND STRL 7.5 (GLOVE) ×2 IMPLANT
GLOVE SKINSENSE STRL SZ7.0 (GLOVE) ×2 IMPLANT
GOWN STRL REUS W/ TWL LRG LVL3 (GOWN DISPOSABLE) ×4 IMPLANT
GOWN STRL REUS W/ TWL XL LVL3 (GOWN DISPOSABLE) ×2 IMPLANT
GOWN STRL REUS W/TWL LRG LVL3 (GOWN DISPOSABLE) ×2
GOWN STRL REUS W/TWL XL LVL3 (GOWN DISPOSABLE) ×1
MAT PREVALON FULL STRYKER (MISCELLANEOUS) IMPLANT
NS IRRIG 1000ML POUR BTL (IV SOLUTION) ×2 IMPLANT
PACK C SECTION WH (CUSTOM PROCEDURE TRAY) ×2 IMPLANT
PAD ABD 7.5X8 STRL (GAUZE/BANDAGES/DRESSINGS) ×2 IMPLANT
PAD OB MATERNITY 4.3X12.25 (PERSONAL CARE ITEMS) ×2 IMPLANT
PAD PREP 24X48 CUFFED NSTRL (MISCELLANEOUS) ×2 IMPLANT
RETRACTOR TRAXI PANNICULUS (MISCELLANEOUS) IMPLANT
RETRACTOR WND ALEXIS 18 MED (MISCELLANEOUS) IMPLANT
RTRCTR WOUND ALEXIS 18CM MED (MISCELLANEOUS) ×1
STRIP CLOSURE SKIN 1/2X4 (GAUZE/BANDAGES/DRESSINGS) ×2 IMPLANT
SUT MNCRL 0 VIOLET CTX 36 (SUTURE) ×4 IMPLANT
SUT MON AB 4-0 PS1 27 (SUTURE) ×2 IMPLANT
SUT MONOCRYL 0 CTX 36 (SUTURE) ×3
SUT PLAIN 2 0 XLH (SUTURE) ×2 IMPLANT
SUT VIC AB 0 CT1 36 (SUTURE) ×4 IMPLANT
SUT VIC AB 3-0 CT1 27 (SUTURE) ×1
SUT VIC AB 3-0 CT1 TAPERPNT 27 (SUTURE) ×2 IMPLANT
TOWEL OR 17X24 6PK STRL BLUE (TOWEL DISPOSABLE) ×4 IMPLANT
TRAXI PANNICULUS RETRACTOR (MISCELLANEOUS) ×1
WATER STERILE IRR 1000ML POUR (IV SOLUTION) ×2 IMPLANT

## 2022-08-18 NOTE — Op Note (Cosign Needed)
Operative Note   SURGERY DATE: 08/18/2022  PRE-OP DIAGNOSIS:  *Pregnancy at [redacted]w[redacted]d  *Failed IOL for CHTN and DM2 on insulin with arrest of dilation at 4cm *BMI 50s *Superimposed severe pre-eclampsia based on severe range BPs *Chorioamnionitis   POST-OP DIAGNOSIS: PPH. Same.   PROCEDURE: primary low transverse cesarean section via pfannenstiel skin incision with double layer uterine closure with Prevena Placement  SURGEON: Surgeon(s) and Role:    * PAletha Halim MD - Primary  ASSISTANT:    *Caron Presume JAngelyn Punt MD - Fellow An experienced assistant was required given the standard of surgical care given the complexity of the case.  This assistant was needed for exposure, dissection, suctioning, retraction, instrument exchange, assisting with delivery with administration of fundal pressure, and for overall help during the procedure.    ANESTHESIA: epidural  ESTIMATED BLOOD LOSS:  1054 ml  due to body habitus  DRAINS: 100 mL UOP via indwelling foley  TOTAL IV FLUIDS: 1450 mL crystalloid  VTE PROPHYLAXIS: SCDs to bilateral lower extremities  ANTIBIOTICS: 2 g Amipicillin, 500 mg Azithromycin, 430 mg Gentamycin, 500 mg flagyl perioperatively  SPECIMENS: Placenta to L&D   COMPLICATIONS: None   FINDINGS: No intra-abdominal adhesions were noted. Grossly normal uterus, tubes and ovaries.  amniotic fluid, cephalic female infant, weight 2940 gm, APGARs 8/9, intact placenta.  PROCEDURE IN DETAIL: The patient was taken to the operating room where anesthesia was administered and normal fetal heart tones were confirmed. She was then prepped and draped in the normal fashion in the dorsal supine position with a leftward tilt.  After a time out was performed, a pfannensteil  skin incision was made with the scalpel and carried through to the underlying layer of fascia. The fascia was then incised at the midline and this incision was extended laterally with the mayo scissors. Attention was turned to  the superior aspect of the fascial incision which was grasped with the kocher clamps x 2, tented up and the rectus muscles were dissected off with the bovi. In a similar fashion the inferior aspect of the fascial incision was grasped with the kocher clamps, tented up and the rectus muscles dissected off with the mayo scissors. The rectus muscles were then separated in the midline and the peritoneum was entered bluntly and an AChiropodistplaced. A low transverse hysterotomy was made with the scalpel until the endometrial cavity was breached and the amniotic sac ruptured with the Allis clamp, yielding clear amniotic fluid. This incision was extended bluntly and the infant's head, shoulders and body were delivered atraumatically.The cord was clamped x 2 and cut, and the infant was handed to the awaiting pediatricians, after delayed cord clamping was not done.  The placenta was then gradually expressed from the uterus and then the uterus was exteriorized and cleared of all clots and debris. The hysterotomy was repaired with a running suture of 1-0 Monocryl. A second imbricating layer of 1-0 Monocryl suture was then placed.  The uterus and adnexa were then returned to the abdomen, and the hysterotomy and all operative sites were reinspected and excellent hemostasis was noted after irrigation and suction of the abdomen with warm saline.  The peritoneum was closed with a running stitch of 3-0 Vicryl. The fascia was reapproximated with 0 Vicryl in a simple running fashion bilaterally. The subcutaneous layer was then reapproximated with interrupted sutures of 2-0 plain gut followed by a running layer of 2-0 plain gut, and the skin was then closed with 4-0 monocryl, in a subcuticular  fashion. A Prevena was then placed  The patient  tolerated the procedure well. Sponge, lap, needle, and instrument counts were correct x 2. The patient was transferred to the recovery room awake, alert and breathing independently in  stable condition.  Gifford Shave, MD  [facility])   Agree with above. I was present and scrubbed for the entire procedure.   Durene Romans MD Attending Center for [employer])

## 2022-08-18 NOTE — Progress Notes (Signed)
Labor Progress Note Krista Adams is a 36 y.o. G1P0000 at 34w3dpresented for IOL 2/2 preE + T2DM.   S: Resting comfortably.   O:  BP 123/61   Pulse 62   Temp 97.8 F (36.6 C) (Oral)   Resp 18   Ht '5\' 4"'$  (1.626 m)   Wt 133.7 kg   LMP  (LMP Unknown)   BMI 50.60 kg/m  EFM: 115 bpm/minimal/+accel, no decels  CVE: Dilation: 4 Effacement (%): 70 Cervical Position: Posterior Station: -3 Presentation: Vertex Exam by:: Krista Adams  A&P: 36y.o. G1P0000 383w3dere for IOL 2/2 DM and pre E #Labor: Unchanged on 36 of pitocin. MVUs 220. Pit break x2. Consider different mode of delivery.  #Pain: Epidural #FWB: Cat I, min variability possible sleep cycle and mag infusion #GBS negative  preE -continue mag and BP protocol  DM CBGs appropriate -Continue current insulin orders   Krista Briere Autry-Lott, DO 5:24 AM

## 2022-08-18 NOTE — Consult Note (Signed)
ANTIBIOTIC CONSULT NOTE - INITIAL  Pharmacy Consult for Gentamicin Indication: Chorioamnionitis   Allergies  Allergen Reactions   Pineapple     itching    Patient Measurements: Height: '5\' 4"'$  (162.6 cm) Weight: 133.7 kg (294 lb 12.8 oz) IBW/kg (Calculated) : 54.7 Adjusted Body Weight: 86.3kg   Vital Signs: Temp: 101.3 F (38.5 C) (09/16 1723) Temp Source: Axillary (09/16 1723) BP: 137/60 (09/16 1731) Pulse Rate: 66 (09/16 1731)  Labs: Recent Labs    08/16/22 0843 08/17/22 1601  WBC 5.2 5.1  HGB 11.2* 11.1*  PLT 219 214  CREATININE  --  0.75   No results for input(s): "GENTTROUGH", "GENTPEAK", "GENTRANDOM" in the last 72 hours.   Microbiology: Recent Results (from the past 720 hour(s))  Strep Gp B NAA     Status: None   Collection Time: 08/09/22  2:34 PM   Specimen: Genital   VR  Result Value Ref Range Status   Strep Gp B NAA Negative Negative Final    Comment: Centers for Disease Control and Prevention (CDC) and American Congress of Obstetricians and Gynecologists (ACOG) guidelines for prevention of perinatal group B streptococcal (GBS) disease specify co-collection of a vaginal and rectal swab specimen to maximize sensitivity of GBS detection. Per the CDC and ACOG, swabbing both the lower vagina and rectum substantially increases the yield of detection compared with sampling the vagina alone. Penicillin G, ampicillin, or cefazolin are indicated for intrapartum prophylaxis of perinatal GBS colonization. Reflex susceptibility testing should be performed prior to use of clindamycin only on GBS isolates from penicillin-allergic women who are considered a high risk for anaphylaxis. Treatment with vancomycin without additional testing is warranted if resistance to clindamycin is noted.     Medications:  Gentamicin '5mg'$ /kg ('430mg'$ ) q24h  Ampicillin 2g q6h  Azithromycin '500mg'$  x1- c-section  Metronidazole '500mg'$  q12h x3 doses   Assessment: 36 y.o. female G1P0000  at 28w3dwith chorioamnionitis who is proceeding with a c-section. Pt had a fever of 101.3. Will monitor fever, clinical course and antibiotic duration.    Goal of Therapy:  Gentamicin peak 6-8 mg/L and Trough < 1 mg/L  Plan:  Gentamicin 430 mg IV x 1  Check Scr with next labs if gentamicin continued. Will check gentamicin levels if continued > 72hr or clinically indicated such as renal function changes.   MBary [email or web address] Pediatric Pharmacy Resident 08/18/2022 6:03 PM

## 2022-08-18 NOTE — Anesthesia Postprocedure Evaluation (Signed)
Anesthesia Post Note  Patient: SORAYAH SCHRODT  Procedure(s) Performed: CESAREAN SECTION (Abdomen)     Patient location during evaluation: PACU Anesthesia Type: Epidural Level of consciousness: oriented and awake and alert Pain management: pain level controlled Vital Signs Assessment: post-procedure vital signs reviewed and stable Respiratory status: spontaneous breathing, respiratory function stable and nonlabored ventilation Cardiovascular status: blood pressure returned to baseline and stable Postop Assessment: no headache, no backache, no apparent nausea or vomiting, epidural receding and patient able to bend at knees Anesthetic complications: no   No notable events documented.  Last Vitals:  Vitals:   08/18/22 2145 08/18/22 2154  BP:  123/66  Pulse: 69 69  Resp: 20 20  Temp: (!) 38.3 C 37.7 C  SpO2: 97% 98%    Last Pain:  Vitals:   08/18/22 2154  TempSrc: Axillary  PainSc: 0-No pain   Pain Goal: Patients Stated Pain Goal: 2 (08/15/22 1657)              Epidural/Spinal Function Cutaneous sensation: No Sensation (08/18/22 2154), Patient able to flex knees: Yes (08/18/22 2154), Patient able to lift hips off bed: Yes (08/18/22 2154), Back pain beyond tenderness at insertion site: No (08/18/22 2154), Progressively worsening motor and/or sensory loss: No (08/18/22 2154), Bowel and/or bladder incontinence post epidural: No (08/18/22 2154)  Willam Munford A.

## 2022-08-18 NOTE — Discharge Summary (Signed)
Postpartum Discharge Summary  Date of Service updated: 08/21/22     Patient Name: Krista Adams DOB: January 09, 1986 MRN: [id number]  Date of admission: 08/15/2022 Delivery date:08/18/2022  Delivering provider: Aletha Halim  Date of discharge: 08/21/2022  Admitting diagnosis: Chronic hypertension affecting pregnancy [O10.919] Intrauterine pregnancy: [redacted]w[redacted]d    Secondary diagnosis:  Active Problems:   Morbid obesity ([place]   Essential hypertension   Depression with anxiety   Hypertension affecting pregnancy   Diabetes mellitus ([place]   Advanced maternal age, primigravida   Pre-existing type 2 diabetes mellitus in pregnancy   BMI 50.0-59.9, adult ([place]   Chronic hypertension affecting pregnancy   Chronic hypertension with superimposed pre-eclampsia   Cesarean delivery delivered  Additional problems: NA    Discharge diagnosis: Term Pregnancy Delivered, Preeclampsia (severe), and Type 2 DM                                              Post partum procedures: NA Augmentation: AROM, Pitocin, Cytotec, and IP Foley Complications: [id number] Hospital course: Induction of Labor With Cesarean Section   36y.o. yo G1P0000 at 316w3das admitted to the hospital 08/15/2022 for induction of labor. Patient had a labor course significant for failure to dilate and triple I.  The patient went for cesarean section due to Arrest of Dilation. Delivery details are as follows: Membrane Rupture Time/Date: 6:26 PM ,08/17/2022   Delivery Method:C-Section, Low Transverse  Details of operation can be found in separate operative note.  Patient received magnesium x 24 hrs. BP was maintained on oral medications. Type 2 DM was managed with insulin and oral agents.  She is ambulating, tolerating a regular diet, passing flatus, and urinating well.  Patient is discharged home in stable condition on 08/21/22.      Newborn Data: Birth date:08/18/2022  Birth time:7:50 PM  Gender:Female  Living status:Living   Apgars:6 ,8  Weight:2940 g                                Magnesium Sulfate received: Yes: Seizure prophylaxis BMZ received: No Rhophylac:No MMR:N/A T-DaP:Given prenatally Flu: No Transfusion:No  Physical exam  Vitals:   08/21/22 0014 08/21/22 0448 08/21/22 0803 08/21/22 1112  BP: (!) 133/58 (!) 142/58 (!) 157/67 (!) 157/71  Pulse: 61 63 61 62  Resp: _0 Temp: 98.3 F (36.8 C) 98.3 F (36.8 C) 98.2 F (36.8 C) 97.6 F (36.4 C)  TempSrc: Oral Oral Oral Oral  SpO2: 100% 100% 98% 98%  Weight:      Height:       General: alert Lochia: appropriate Uterine Fundus: firm Incision: Healing well with no significant drainage DVT Evaluation: No evidence of DVT seen on physical exam. Labs: Lab Results  Component Value Date   WBC 18.2 (H) 08/19/2022   HGB 9.7 (L) 08/19/2022   HCT 28.7 (L) 08/19/2022   MCV 87.2 08/19/2022   PLT 194 08/19/2022      Latest Ref Rng & Units 08/19/2022    4:17 AM  CMP  Glucose 70 - 99 mg/dL 305   BUN 6 - 20 mg/dL 9   Creatinine 0.44 - 1.00 mg/dL 0.99   Sodium 135 - 145 mmol/L 129   Potassium 3.5 - 5.1 mmol/L 5.1   Chloride 98 -  111 mmol/L 102   CO2 22 - 32 mmol/L 19   Calcium 8.9 - 10.3 mg/dL 6.8   Total Protein 6.5 - 8.1 g/dL 5.4   Total Bilirubin 0.3 - 1.2 mg/dL 0.6   Alkaline Phos 38 - 126 U/L 75   AST 15 - 41 U/L 40   ALT 0 - 44 U/L 17    Edinburgh Score:    08/19/2022    4:46 PM  Edinburgh Postnatal Depression Scale Screening Tool  I have been able to laugh and see the funny side of things. 0  I have looked forward with enjoyment to things. 0  I have blamed myself unnecessarily when things went wrong. 0  I have been anxious or worried for no good reason. 0  I have felt scared or panicky for no good reason. 0  Things have been getting on top of me. 0  I have been so unhappy that I have had difficulty sleeping. 0  I have felt sad or miserable. 1  I have been so unhappy that I have been crying. 0  The thought of harming  myself has occurred to me. 0  Edinburgh Postnatal Depression Scale Total 1     After visit meds:  Allergies as of 08/21/2022       Reactions   Pineapple    itching        Medication List     STOP taking these medications    BASAGLAR KWIKPEN [place]   labetalol 100 MG tablet Commonly known as: NORMODYNE   lidocaine 2 % solution Commonly known as: XYLOCAINE   metoCLOPramide 10 MG tablet Commonly known as: REGLAN   nystatin ointment Commonly known as: MYCOSTATIN   pantoprazole 40 MG tablet Commonly known as: Protonix       TAKE these medications    Accu-Chek Aviva Plus test strip Generic drug: glucose blood USE TO CHECK BLOOD SUGARS TWICE DAILY E11.69   blood glucose meter kit and supplies Kit Dispense based on patient and insurance preference. Use up to four times daily as directed. (FOR ICD-9 250.00, 250.01).   famotidine 20 MG tablet Commonly known as: PEPCID Take 1 tablet (20 mg total) by mouth 2 (two) times daily.   furosemide 20 MG tablet Commonly known as: LASIX Take 1 tablet (20 mg total) by mouth 2 (two) times daily.   ibuprofen 600 MG tablet Commonly known as: ADVIL Take 1 tablet (600 mg total) by mouth every 6 (six) hours.   insulin aspart 100 UNIT/ML injection Commonly known as: novoLOG Inject into the skin 3 (three) times daily before meals.   insulin glargine-yfgn 100 UNIT/ML injection Commonly known as: SEMGLEE Inject 0.4 mLs (40 Units total) into the skin at bedtime.   metformin 1000 MG (OSM) 24 hr tablet Commonly known as: FORTAMET Take 1 tablet (1,000 mg total) by mouth 2 (two) times daily with a meal.   NIFEdipine 30 MG 24 hr tablet Commonly known as: ADALAT CC Take 1 tablet (30 mg total) by mouth 2 (two) times daily.   oxyCODONE 5 MG immediate release tablet Commonly known as: Oxy IR/ROXICODONE Take 1-2 tablets (5-10 mg total) by mouth every 4 (four) hours as needed for moderate pain.               Discharge Care  Instructions  (From admission, onward)           Start     Ordered   08/21/22 0000  Discharge wound care:  Comments: Continue with Provena. F/U in office as scheduled next week for incision check and removal of Provena.   08/21/22 1213             Discharge home in stable condition Infant Feeding: Bottle Infant Disposition:home with mother Discharge instruction: per After Visit Summary and Postpartum booklet. Activity: Advance as tolerated. Pelvic rest for 6 weeks.  Diet: carb modified diet Future Appointments: Future Appointments  Date Time Provider [place]  08/27/2022 11:00 AM [facility] None  09/03/2022  1:00 PM Lynnea Ferrier, LCSW [facility] None  10/09/2022  9:15 AM [facility] None  10/09/2022 10:55 AM Chancy Milroy, MD [place] None  11/22/2022  3:40 PM [name], FNP [facility] None   Follow up Visit:  Follow-up [facility] Follow up.   Specialty: Obstetrics and Gynecology Why: 08/27/22 for incision and BP check Contact information: [address] [place] [phone]                Message sent 08/18/22  Please schedule this patient for a In person postpartum visit in 4 weeks with the following provider: Any provider. Additional Postpartum F/U:Postpartum Depression checkup, 2 hour GTT, Incision check 1 week, and BP check 1 week  High risk pregnancy complicated by: GDM and HTN Delivery mode:  C-Section, Low Transverse  Anticipated Birth Control:  IUD was hoping to get ppIUD but ruled out due to chorio.    08/21/2022 Chancy Milroy, MD

## 2022-08-18 NOTE — Progress Notes (Addendum)
OB Note SVE exam unchanged at 4cm per Dr. Caron Presume on 28 of pitocin after being restarted at 0930 after having been stopped overnight at 5am for a pitocin break. Borderline MVU adequacy for the past hour at 150-180 MVUs and contractions q 3-17m she [place].   Long d/w pt and partner re: plan of care. I said that can proceed with c-section now or give it another 3 hours and recheck cervix and if unchanged proceed with c-section for arrest of dilation. I told her I favor the latter but if she wanted to proceed now then that would be fine. R/b of c-section d/w her especially risk of bleeding and infection. She would like to recheck at 1600. Pitocin at 28 now, will increase to 30 and leave it there. I asked the FOB to step out, which he had no problems with, and I asked her about birth control, as there's a note in her chart that she wants a BTL but didn't want partner to know. I d/w her re: BTL r/b/a and she states that she still wants to proceed with LNG IUD placement regardless of mode of delivery.   CDurene RomansMD Attending Center for [facility](Faculty Practice) 08/18/2022 Time: 1300

## 2022-08-18 NOTE — Progress Notes (Signed)
Labor Progress Note Krista Adams is a 36 y.o. G1P0000 at 67w3dpresented for IOL 2/2 preE + T2DM.   S: Resting comfortably.   O:  BP (!) 144/61   Pulse (!) 58   Temp 98.2 F (36.8 C) (Oral)   Resp 18   Ht '5\' 4"'$  (1.626 m)   Wt 133.7 kg   LMP  (LMP Unknown)   BMI 50.60 kg/m  EFM: 110 bpm/moderate/+accels, no decels  CVE: Dilation: 4 Effacement (%): 70 Cervical Position: Posterior Station: -3 Presentation: Vertex Exam by:: AJanus Molder  A&P: 36y.o. G1P0000 313w3dere for IOL 2/2 DM and pre E #Labor: Unchanged but contraction pattern starting to normalize. Continue 36 of pitocin for now. MVUs 190. FSE attempted and was not successful with staying in. Will continue to monitor externally which has not been the best during the entire labor course.  #Pain: Epidural #FWB: Cat I #GBS negative  preE -continue mag and BP protocol  DM CBGs appropriate -Continue current insulin orders   Marissa Weaver Autry-Lott, DO 12:25 AM

## 2022-08-18 NOTE — Transfer of Care (Signed)
Immediate Anesthesia Transfer of Care Note  Patient: Krista Adams  Procedure(s) Performed: CESAREAN SECTION (Abdomen)  Patient Location: PACU  Anesthesia Type:Regional and Epidural  Level of Consciousness: awake, alert , oriented and patient cooperative  Airway & Oxygen Therapy: Patient Spontanous Breathing  Post-op Assessment: Report given to RN and Post -op Vital signs reviewed and stable  Post vital signs: Reviewed and stable  Last Vitals:  Vitals Value Taken Time  BP 136/90 08/18/22 2104  Temp 38.2 C 08/18/22 2104  Pulse 70 08/18/22 2110  Resp 11 08/18/22 2110  SpO2 99 % 08/18/22 2110  Vitals shown include unvalidated device data.  Last Pain:  Vitals:   08/18/22 2104  TempSrc: Oral  PainSc:       Patients Stated Pain Goal: 2 (03/75/43 6067)  Complications: No notable events documented.

## 2022-08-18 NOTE — Progress Notes (Signed)
OB Note Patient unchanged. Will proceed with c-section. Pt with chorio. Will do amp/gent/clinda  x 24h and azithro  Aletha Halim, Brooke Bonito MD Attending Center for [employer] (Faculty Practice) 08/18/2022 Time: [phone]

## 2022-08-19 ENCOUNTER — Encounter (HOSPITAL_COMMUNITY): Payer: Self-pay | Admitting: Family Medicine

## 2022-08-19 LAB — BPAM RBC
Blood Product Expiration Date: 202309262359
Blood Product Expiration Date: 202309262359
Unit Type and Rh: 7300
Unit Type and Rh: 7300

## 2022-08-19 LAB — COMPREHENSIVE METABOLIC PANEL
ALT: 17 U/L (ref 0–44)
AST: 40 U/L (ref 15–41)
Albumin: 2 g/dL — ABNORMAL LOW (ref 3.5–5.0)
Alkaline Phosphatase: 75 U/L (ref 38–126)
Anion gap: 8 (ref 5–15)
BUN: 9 mg/dL (ref 6–20)
CO2: 19 mmol/L — ABNORMAL LOW (ref 22–32)
Calcium: 6.8 mg/dL — ABNORMAL LOW (ref 8.9–10.3)
Chloride: 102 mmol/L (ref 98–111)
Creatinine, Ser: 0.99 mg/dL (ref 0.44–1.00)
GFR, Estimated: >60 mL/min (ref >60)
Glucose, Bld: 305 mg/dL — ABNORMAL HIGH (ref 70–99)
Potassium: 5.1 mmol/L (ref 3.5–5.1)
Sodium: 129 mmol/L — ABNORMAL LOW (ref 135–145)
Total Bilirubin: 0.6 mg/dL (ref 0.3–1.2)
Total Protein: 5.4 g/dL — ABNORMAL LOW (ref 6.5–8.1)

## 2022-08-19 LAB — TYPE AND SCREEN
ABO/RH(D): B POS
Antibody Screen: POSITIVE
Unit division: 0
Unit division: 0

## 2022-08-19 LAB — CBC
HCT: 28.7 % — ABNORMAL LOW (ref 36.0–46.0)
Hemoglobin: 9.7 g/dL — ABNORMAL LOW (ref 12.0–15.0)
MCH: 29.5 pg (ref 26.0–34.0)
MCHC: 33.8 g/dL (ref 30.0–36.0)
MCV: 87.2 fL (ref 80.0–100.0)
Platelets: 194 10*3/uL (ref 150–400)
RBC: 3.29 MIL/uL — ABNORMAL LOW (ref 3.87–5.11)
RDW: 13.4 % (ref 11.5–15.5)
WBC: 18.2 10*3/uL — ABNORMAL HIGH (ref 4.0–10.5)
nRBC: 0 % (ref 0.0–0.2)

## 2022-08-19 LAB — GLUCOSE, CAPILLARY
Glucose-Capillary: 231 mg/dL — ABNORMAL HIGH (ref 70–99)
Glucose-Capillary: 204 mg/dL — ABNORMAL HIGH (ref 70–99)
Glucose-Capillary: 183 mg/dL — ABNORMAL HIGH (ref 70–99)
Glucose-Capillary: 184 mg/dL — ABNORMAL HIGH (ref 70–99)

## 2022-08-19 MED ORDER — POLYETHYLENE GLYCOL 3350 17 G PO PACK
17.0000 g | PACK | Freq: Every day | ORAL | Status: DC
  Administered 2022-08-19 – 2022-08-21 (×3): 17 g via ORAL
  Filled 2022-08-19 (×3): qty 1

## 2022-08-19 MED ORDER — METRONIDAZOLE 500 MG/100ML IV SOLN
500.0000 mg | Freq: Two times a day (BID) | INTRAVENOUS | Status: AC
  Administered 2022-08-19 (×2): 500 mg via INTRAVENOUS
  Filled 2022-08-19 (×2): qty 100

## 2022-08-19 MED ORDER — INSULIN GLARGINE-YFGN 100 UNIT/ML ~~LOC~~ SOLN: 30.0000 [IU] | Freq: Two times a day (BID) | SUBCUTANEOUS | Status: DC

## 2022-08-19 MED ORDER — KETOROLAC TROMETHAMINE 30 MG/ML IJ SOLN
30.0000 mg | Freq: Four times a day (QID) | INTRAMUSCULAR | Status: AC | PRN
  Administered 2022-08-19 – 2022-08-20 (×3): 30 mg via INTRAVENOUS
  Filled 2022-08-19 (×3): qty 1

## 2022-08-19 MED ORDER — INSULIN ASPART 100 UNIT/ML IJ SOLN
0.0000 [IU] | Freq: Three times a day (TID) | INTRAMUSCULAR | Status: DC
  Administered 2022-08-19: 4 [IU] via SUBCUTANEOUS
  Administered 2022-08-19: 6 [IU] via SUBCUTANEOUS
  Administered 2022-08-19: 4 [IU] via SUBCUTANEOUS
  Administered 2022-08-19 – 2022-08-20 (×2): 6 [IU] via SUBCUTANEOUS
  Administered 2022-08-20: 4 [IU] via SUBCUTANEOUS
  Administered 2022-08-20 (×2): 2 [IU] via SUBCUTANEOUS
  Administered 2022-08-21: 6 [IU] via SUBCUTANEOUS

## 2022-08-19 MED ORDER — METFORMIN HCL ER 500 MG PO TB24: 500.0000 mg | ORAL_TABLET | Freq: Every day | ORAL | Status: DC

## 2022-08-19 MED ORDER — LABETALOL HCL 200 MG PO TABS
300.0000 mg | ORAL_TABLET | Freq: Two times a day (BID) | ORAL | Status: DC
  Administered 2022-08-19 – 2022-08-21 (×5): 300 mg via ORAL
  Filled 2022-08-19 (×5): qty 1

## 2022-08-19 MED ORDER — HYDRALAZINE HCL 20 MG/ML IJ SOLN: 10.0000 mg | INTRAMUSCULAR | Status: DC | PRN

## 2022-08-19 MED ORDER — METFORMIN HCL ER 500 MG PO TB24
500.0000 mg | ORAL_TABLET | Freq: Every day | ORAL | Status: DC
  Administered 2022-08-19 – 2022-08-20 (×2): 500 mg via ORAL
  Filled 2022-08-19 (×3): qty 1

## 2022-08-19 MED ORDER — FAMOTIDINE 20 MG PO TABS
20.0000 mg | ORAL_TABLET | Freq: Two times a day (BID) | ORAL | Status: DC
  Administered 2022-08-19 – 2022-08-21 (×5): 20 mg via ORAL
  Filled 2022-08-19 (×5): qty 1

## 2022-08-19 MED ORDER — LABETALOL HCL 5 MG/ML IV SOLN: 80.0000 mg | INTRAVENOUS | Status: DC | PRN

## 2022-08-19 MED ORDER — LACTATED RINGERS IV SOLN: INTRAVENOUS | Status: DC

## 2022-08-19 MED ORDER — INSULIN GLARGINE-YFGN 100 UNIT/ML ~~LOC~~ SOLN
30.0000 [IU] | Freq: Every day | SUBCUTANEOUS | Status: DC
  Administered 2022-08-19: 30 [IU] via SUBCUTANEOUS
  Filled 2022-08-19 (×3): qty 0.3

## 2022-08-19 MED ORDER — LABETALOL HCL 5 MG/ML IV SOLN: 40.0000 mg | INTRAVENOUS | Status: DC | PRN

## 2022-08-19 MED ORDER — SODIUM CHLORIDE 0.9% FLUSH: 3.0000 mL | INTRAVENOUS | Status: DC | PRN

## 2022-08-19 MED ORDER — NALOXONE HCL 0.4 MG/ML IJ SOLN: 0.4000 mg | INTRAMUSCULAR | Status: DC | PRN

## 2022-08-19 MED ORDER — FUROSEMIDE 20 MG PO TABS
20.0000 mg | ORAL_TABLET | Freq: Two times a day (BID) | ORAL | Status: DC
  Administered 2022-08-19 – 2022-08-21 (×5): 20 mg via ORAL
  Filled 2022-08-19 (×5): qty 1

## 2022-08-19 MED ORDER — SENNOSIDES-DOCUSATE SODIUM 8.6-50 MG PO TABS
2.0000 | ORAL_TABLET | Freq: Every evening | ORAL | Status: DC | PRN
  Administered 2022-08-19 – 2022-08-20 (×2): 2 via ORAL
  Filled 2022-08-19 (×2): qty 2

## 2022-08-19 MED ORDER — KETOROLAC TROMETHAMINE 30 MG/ML IJ SOLN: 30.0000 mg | Freq: Four times a day (QID) | INTRAMUSCULAR | Status: AC | PRN

## 2022-08-19 MED ORDER — INSULIN GLARGINE 100 UNIT/ML ~~LOC~~ SOLN
30.0000 [IU] | Freq: Two times a day (BID) | SUBCUTANEOUS | Status: DC
  Filled 2022-08-19 (×2): qty 0.3

## 2022-08-19 MED ORDER — SODIUM CHLORIDE 0.9 % IV SOLN
2.0000 g | Freq: Four times a day (QID) | INTRAVENOUS | Status: AC
  Administered 2022-08-19 (×3): 2 g via INTRAVENOUS
  Filled 2022-08-19 (×3): qty 2000

## 2022-08-19 MED ORDER — DIPHENHYDRAMINE HCL 50 MG/ML IJ SOLN: 12.5000 mg | INTRAMUSCULAR | Status: DC | PRN

## 2022-08-19 MED ORDER — ENOXAPARIN SODIUM 80 MG/0.8ML IJ SOSY
70.0000 mg | PREFILLED_SYRINGE | INTRAMUSCULAR | Status: DC
  Administered 2022-08-19 – 2022-08-21 (×3): 70 mg via SUBCUTANEOUS
  Filled 2022-08-19 (×3): qty 0.8

## 2022-08-19 MED ORDER — NALOXONE HCL 4 MG/10ML IJ SOLN: 1.0000 ug/kg/h | INTRAVENOUS | Status: DC | PRN

## 2022-08-19 MED ORDER — ONDANSETRON HCL 4 MG/2ML IJ SOLN: 4.0000 mg | Freq: Three times a day (TID) | INTRAMUSCULAR | Status: DC | PRN

## 2022-08-19 MED ORDER — INFLUENZA VAC SPLIT QUAD 0.5 ML IM SUSY
0.5000 mL | PREFILLED_SYRINGE | INTRAMUSCULAR | Status: AC
  Administered 2022-08-21: 0.5 mL via INTRAMUSCULAR
  Filled 2022-08-19: qty 0.5

## 2022-08-19 MED ORDER — LABETALOL HCL 5 MG/ML IV SOLN: 20.0000 mg | INTRAVENOUS | Status: DC | PRN

## 2022-08-19 MED ORDER — DIPHENHYDRAMINE HCL 25 MG PO CAPS: 25.0000 mg | ORAL_CAPSULE | ORAL | Status: DC | PRN

## 2022-08-19 NOTE — Inpatient Diabetes Management (Signed)
Inpatient Diabetes Program Recommendations  AACE/ADA: New Consensus Statement on Inpatient Glycemic Control (2015)  Target Ranges:  Prepandial:   less than 140 mg/dL      Peak postprandial:   less than 180 mg/dL (1-2 hours)      Critically ill patients:  140 - 180 mg/dL   Lab Results  Component Value Date   GLUCAP 231 (H) 08/19/2022   HGBA1C 6.3 (H) 08/15/2022    Review of Glycemic Control  Diabetes history: DM2 Outpatient Diabetes medications: Basaglar 65 units am, 60 units pm, Novolog tid meal coverage ? dose Current orders for Inpatient glycemic control: Lantus 30 units bid, Novolog 0-14 units tid post meals  Inpatient Diabetes Program Recommendations:   Received consult regarding diabetes management post partum patient. Please consider: -Decrease Semglee to 30 units qd (0.2 units/kg x 133.7 kg = 27 units) -Change Novolog correction to 0-9 units ac meals tid, 0-5 units hs -Metformin 500 mg qd  Thank you, Nani Gasser. Ammy Lienhard, RN, MSN, CDE  Diabetes Coordinator Inpatient Glycemic Control Team Team Pager [phone] (8am-5pm) 08/19/2022 9:35 AM

## 2022-08-19 NOTE — Progress Notes (Signed)
Daily Postpartum Note  Admission Date: 08/15/2022 Current Date: 08/19/2022 7:47 AM  Krista Adams is a 36 y.o. G1P1001 POD#1 PLTCS and Prevena placement @ 33w3dfor failed IOL (arrest of dilation) for DM2, CHTN; labor c/b superimposed severe pre-eclampsia  Pregnancy complicated by: Patient Active Problem List   Diagnosis Date Noted   Cesarean delivery delivered 08/18/2022   Chronic hypertension affecting pregnancy 08/15/2022   Chronic hypertension with superimposed pre-eclampsia 08/15/2022   BMI 50.0-59.9, adult ([place] 07/26/2022   Diabetes mellitus ([place] 040/34/7425  Systolic murmur 095/63/8756  Supervision of high risk pregnancy, antepartum 06/11/2022   Hypertension affecting pregnancy 02/06/2022   Advanced maternal age, primigravida 02/06/2022   Pre-existing type 2 diabetes mellitus in pregnancy 02/06/2022   Depression with anxiety 03/18/2012   DM (diabetes mellitus), type 2, uncontrolled 05/06/2008   Morbid obesity ([place] 05/06/2008   Essential hypertension 05/06/2008   Overnight/24hr events:  none  Subjective:  Patient has some right foot numbness but can move foot fine.   Objective:    Current Vital Signs 24h Vital Sign Ranges  T 99.3 F (37.4 C) Temp  Avg: 99.4 F (37.4 C)  Min: 97.7 F (36.5 C)  Max: 101.3 F (38.5 C)  BP (!) 137/56 BP  Min: 111/64  Max: 156/52  HR 64 Pulse  Avg: 66.6  Min: 58  Max: 99  RR 16 Resp  Avg: 19.1  Min: 16  Max: 26  SaO2 98 % Room Air SpO2  Avg: 98.3 %  Min: 97 %  Max: 100 %       24 Hour I/O Current Shift I/O  Time Ins Outs 09/16 0701 - 09/17 0700 In: 6155.4 [P.O.:2760; I.V.:2511.7] Out: 4029 [Urine:2975] No intake/output data recorded.   Patient Vitals for the past 24 hrs:  BP Temp Temp src Pulse Resp SpO2  08/19/22 0700 -- -- -- -- 16 --  08/19/22 0600 -- -- -- -- 18 --  08/19/22 0500 -- -- -- -- 18 --  08/19/22 0428 (!) 137/56 99.3 F (37.4 C) Oral 64 17 98 %  08/19/22 0400 -- -- -- -- 17 --  08/19/22 0300 -- -- -- --  20 --  08/19/22 0200 -- -- -- -- 20 --  08/19/22 0100 -- -- -- -- 20 --  08/19/22 0011 (!) 115/54 98.4 F (36.9 C) Oral 67 20 98 %  08/19/22 0000 -- -- -- -- 20 --  08/18/22 2300 -- -- -- -- 20 --  08/18/22 2154 123/66 99.9 F (37.7 C) Axillary 69 20 98 %  08/18/22 2145 -- (!) 100.9 F (38.3 C) Axillary 69 20 97 %  08/18/22 2130 124/60 -- -- 71 16 --  08/18/22 2116 133/68 -- -- 71 19 98 %  08/18/22 2115 -- -- -- 70 (!) 26 99 %  08/18/22 2104 (!) 136/90 (!) 100.7 F (38.2 C) Oral 72 (!) 24 100 %  08/18/22 1901 (!) 130/54 -- -- 71 -- --  08/18/22 1856 135/60 -- -- 80 -- --  08/18/22 1851 (!) 141/48 -- -- 70 -- --  08/18/22 1846 (!) 142/50 -- -- 70 -- --  08/18/22 1841 (!) 156/52 -- -- 70 -- --  08/18/22 1831 (!) 149/63 -- -- 68 -- --  08/18/22 1801 128/86 -- -- 99 -- --  08/18/22 1731 137/60 -- -- 66 -- --  08/18/22 1723 -- (!) 101.3 F (38.5 C) Axillary -- -- --  08/18/22 1701 (!) 147/63 -- -- 66 -- --  08/18/22  1631 (!) 149/69 -- -- 64 -- --  08/18/22 1620 -- 99.9 F (37.7 C) Axillary -- -- --  08/18/22 1601 (!) 142/55 -- -- 61 -- --  08/18/22 1531 138/67 -- -- 60 -- --  08/18/22 1517 -- 98.8 F (37.1 C) Axillary -- -- --  08/18/22 1501 (!) 127/98 -- -- 88 -- --  08/18/22 1431 (!) 130/54 -- -- (!) 59 -- --  08/18/22 1430 (!) 130/54 -- -- (!) 59 -- --  08/18/22 1401 139/61 -- -- 62 -- --  08/18/22 1331 133/65 -- -- 64 -- --  08/18/22 1317 -- 97.7 F (36.5 C) Oral -- 16 --  08/18/22 1301 (!) 147/60 -- -- 70 -- --  08/18/22 1231 134/62 -- -- 65 -- --  08/18/22 1201 125/62 -- -- 63 -- --  08/18/22 1131 125/67 -- -- 61 -- --  08/18/22 1121 -- 98.5 F (36.9 C) Oral -- 16 --  08/18/22 1101 111/64 -- -- (!) 58 -- --  08/18/22 1031 125/69 -- -- (!) 59 -- --  08/18/22 1001 (!) 153/77 -- -- 61 -- --  08/18/22 0931 (!) 142/69 -- -- 60 -- --  08/18/22 0900 (!) 149/78 -- -- (!) 58 -- --  08/18/22 0849 -- 97.8 F (36.6 C) Oral -- -- --  08/18/22 0832 (!) 146/71 -- -- 60 -- --   08/18/22 0830 (!) 146/71 -- -- 60 -- --  08/18/22 0801 (!) 149/70 -- -- (!) 59 -- --   UOP: >138m/hr  Physical exam: General: Well nourished, well developed female in no acute distress. Abdomen: obese, prevena in place and functioning well. Rare bowel sounds Cardiovascular: S1, S2 normal, no murmur, rub or gallop, regular rate and rhythm Respiratory: CTAB Extremities: no clubbing, cyanosis or edema Skin: Warm and dry.   Medications: Current Facility-Administered Medications  Medication Dose Route Frequency Provider Last Rate Last Admin   acetaminophen (TYLENOL) tablet 1,000 mg  1,000 mg Oral Q6H Cresenzo, JAngelyn Punt MD   1,000 mg at 08/19/22 0304   ampicillin (OMNIPEN) 2 g in sodium chloride 0.9 % 100 mL IVPB  2 g Intravenous Q6H PAletha Halim MD 300 mL/hr at 08/19/22 0653 2 g at 08/19/22 0653   coconut oil  1 Application Topical PRN Cresenzo, JAngelyn Punt MD       witch hazel-glycerin (TUCKS) pad 1 Application  1 Application Topical PRN CConcepcion Living MD       And   dibucaine (NUPERCAINAL) 1 % rectal ointment 1 Application  1 Application Rectal PRN CConcepcion Living MD       diphenhydrAMINE (BENADRYL) injection 12.5 mg  12.5 mg Intravenous Q4H PRN FJosephine Igo MD       Or   diphenhydrAMINE (BENADRYL) capsule 25 mg  25 mg Oral Q4H PRN FJosephine Igo MD       diphenhydrAMINE (BENADRYL) capsule 25 mg  25 mg Oral Q6H PRN CConcepcion Living MD   25 mg at 08/19/22 0116   enoxaparin (LOVENOX) injection 70 mg  70 mg Subcutaneous Q24H Cresenzo, JAngelyn Punt MD       furosemide (LASIX) tablet 20 mg  20 mg Oral BID PAletha Halim MD       labetalol (NORMODYNE) injection 20 mg  20 mg Intravenous PRN Cresenzo, JAngelyn Punt MD       And   labetalol (NORMODYNE) injection 40 mg  40 mg Intravenous PRN CConcepcion Living MD       And   labetalol (  NORMODYNE) injection 80 mg  80 mg Intravenous PRN Concepcion Living, MD       And   hydrALAZINE (APRESOLINE) injection 10 mg  10 mg Intravenous PRN Cresenzo,  Angelyn Punt, MD       insulin aspart (novoLOG) injection 0-14 Units  0-14 Units Subcutaneous TID PC Aletha Halim, MD   6 Units at 08/19/22 0645   insulin glargine (LANTUS) injection 30 Units  30 Units Subcutaneous BID Concepcion Living, MD       ketorolac (TORADOL) 30 MG/ML injection 30 mg  30 mg Intravenous Q6H PRN Josephine Igo, MD       Or   ketorolac (TORADOL) 30 MG/ML injection 30 mg  30 mg Intramuscular Q6H PRN Josephine Igo, MD       labetalol (NORMODYNE) tablet 300 mg  300 mg Oral BID Concepcion Living, MD       magnesium sulfate 40 grams in SWI 1000 mL OB infusion  2 g/hr Intravenous Continuous Aletha Halim, MD 50 mL/hr at 08/18/22 1518 2 g/hr at 08/18/22 1518   menthol-cetylpyridinium (CEPACOL) lozenge 3 mg  1 lozenge Oral Q2H PRN Concepcion Living, MD       metroNIDAZOLE (FLAGYL) IVPB 500 mg  500 mg Intravenous Q12H Aletha Halim, MD       naloxone [facility]) injection 0.4 mg  0.4 mg Intravenous PRN Josephine Igo, MD       And   sodium chloride flush (NS) 0.9 % injection 3 mL  3 mL Intravenous PRN Josephine Igo, MD       naloxone HCl [facility]) 2 mg in dextrose 5 % 250 mL infusion  1-4 mcg/kg/hr Intravenous Continuous PRN Josephine Igo, MD       ondansetron [facility]) injection 4 mg  4 mg Intravenous Q8H PRN Josephine Igo, MD       oxyCODONE (Oxy IR/ROXICODONE) immediate release tablet 5-10 mg  5-10 mg Oral Q4H PRN Concepcion Living, MD       oxytocin (PITOCIN) IV infusion 30 units in NS 500 mL - Premix  2.5 Units/hr Intravenous Continuous Cresenzo, Angelyn Punt, MD       polyethylene glycol (MIRALAX / GLYCOLAX) packet 17 g  17 g Oral Daily Aletha Halim, MD       prenatal multivitamin tablet 1 tablet  1 tablet Oral Q1200 Cresenzo, Angelyn Punt, MD       senna-docusate (Senokot-S) tablet 2 tablet  2 tablet Oral QHS PRN Aletha Halim, MD       simethicone (MYLICON) chewable tablet 80 mg  80 mg Oral TID PC Cresenzo, Angelyn Punt, MD       Tdap (BOOSTRIX) injection 0.5 mL  0.5 mL  Intramuscular Once Concepcion Living, MD       zolpidem (AMBIEN) tablet 5 mg  5 mg Oral QHS PRN Concepcion Living, MD        Labs:  Recent Labs  Lab 08/18/22 1746 08/18/22 2200 08/19/22 0417  WBC 7.4 13.4* 18.2*  HGB 11.6* 10.4* 9.7*  HCT 34.3* 30.1* 28.7*  PLT 192 200 194    Recent Labs  Lab 08/17/22 1601 08/18/22 1746 08/19/22 0417  NA 133* 130* 129*  K 4.4 4.9 5.1  CL 104 100 102  CO2 21* 19* 19*  BUN '9 8 9  '$ CREATININE 0.75 0.83 0.99  CALCIUM 7.8* 7.4* 6.8*  PROT 6.2* 5.9* 5.4*  BILITOT 0.4 1.2 0.6  ALKPHOS 80 80 75  ALT '15 18 17  '$ AST 27 41 40  GLUCOSE 161* 72 305*    Latest Reference Range & Units 08/18/22 13:15 08/18/22 17:26 08/18/22 22:52 08/18/22 23:10 08/18/22 23:32 08/19/22 06:22  Glucose-Capillary 70 - 99 mg/dL 79 73 48 (L) 54 (L) 125 (H) 231 (H)   Radiology:  No new imaging  Assessment & Plan:  Patient stable *Postpartum: breast. Unable to place IUD due to chorio at time of c-section *CHTN with superimposed severe pre-eclampsia: was on labetalol 300 bid in pregnancy. Currently just on lasix bid. Continue to follow closely *DM2: was on Lantus 65/60 during pregnancy and SSI with meals. Lantus 30/30 restarted for this morning and SSI for AM fasting. Continue AM fasting and 2 hour post prandials *Chorio: abx x 24 hours *PPx: lovenox *FEN/GI: DM diet, MIVF *Dispo: likely pod#3  Durene Romans MD Attending Center for [place] (Faculty Practice) GYN Consult Phone: [phone] (M-F, 0800-1700) & [phone]  (Off hours, weekends, holidays)

## 2022-08-19 NOTE — Clinical Social Work Maternal (Signed)
CLINICAL SOCIAL WORK MATERNAL/CHILD NOTE  Patient Details  Name: Krista Adams MRN: [id number] Date of Birth: 01/15/1986  Date:  March 18, 2022  Clinical Social Worker Initiating Note:  Idamae Lusher, LCSWA Date/Time: Initiated:  08/19/22/1448     Child's Name:  Krista Adams   Biological Parents:  Mother, Father (FOB: Krista Adams, DOB: 09/02/83)   Need for Interpreter:  None   Reason for Referral:  Current Substance Use/Substance Use During Pregnancy  , Behavioral Health Concerns   Address:  [address] [place] [address]    Phone number:  [phone] (home)     Additional phone number:   Household Members/Support Persons (HM/SP):   Household Member/Support Person 1   HM/SP Name Relationship DOB or Age  HM/SP -1 Krista Adams FOB 09/02/1983  HM/SP -2        HM/SP -3        HM/SP -4        HM/SP -5        HM/SP -6        HM/SP -7        HM/SP -8          Natural Supports (not living in the home):  Immediate Family   Professional Supports: None   Employment: Full-time   Type of Work: Quarry manager   Education:  [employer] school graduate   Homebound arranged:    Museum/gallery curator Resources:  Multimedia programmer    Other Resources:  [facility]   Cultural/Religious Considerations Which May Impact Care:  None identified  Strengths:  Ability to meet basic needs  , Engineer, materials, Home prepared for child     Psychotropic Medications:         Pediatrician:    [place]olicitor area  Pediatrician List:   [facility] [place]      Pediatrician Fax Number:    Risk Factors/Current Problems:  Mental Health Concerns     Cognitive State:  Able to Concentrate  , Alert  , Linear Thinking  , Goal Oriented     Mood/Affect:  Tearful  , Relaxed  , Comfortable  , Interested     CSW Assessment: CSW received a consult for history of anxiety/depression and  drug exposed newborn. CSW met with MOB at bedside to complete assessment and offer resources. When CSW entered room, FOB was present. MOB was laying in hospital bed and FOB was bonding with infant skin-to-skin. MOB provided verbal consent to speak in front of FOB about anything. CSW introduced self and reasons for consult. MOB presented as polite and remained engaged throughout consult.  CSW inquired about MOB's mental health history. MOB reports she first began experiencing anxiety and depression at age 36. MOB reports she was on a psychotropic medication (could not recall name of medication) prior to being pregnant but discontinued medication when she found out she was pregnant. MOB reports since she became pregnant, she has emotionally felt "a lot better", sharing that she has felt less on edge during her pregnancy. MOB reports she has felt "emotional" since giving birth and reports crying easily. MOB identified her mother and FOB as supports, sharing that she was in labor for 4 days, adding that she  developed an infection after her water was broken and eventually had a c-section. MOB shares she loves her baby and would go to the [place]  of the [place] for her. MOB expressed frustration regarding infant's UDS. MOB shared that she was not informed about UDS prior to her nurse placing a cotton ball in infant's diaper. MOB reports that she is fine with the drug screen policy but was upset that she was not informed what was happening and became tearful when explaining what happened to CSW. CSW validated MOB's emotions and provided support. MOB denied current SI/HI. DV was not assessed due to FOB being present.   MOB reports she has all needed items for infant, including a car seat and bassinet. MOB has chosen [facility] as infant's pediatrician office. MOB reports she receives WIC. CSW encouraged MOB to notify her caseworker of infant's birth to have infant added to benefits. MOB reports she does not receive  food stamps but is interested in applying. CSW texted MOB an online application for food stamps.   CSW informed MOB about hospital drug screen policy due to documented marijuana use February, 2023. CSW explained that infant's UDS and CDS would be monitored and a CPS report would be made if warranted. MOB expressed frustration about how infant's UDS was collected again but verbalized understanding. CSW inquired about substance use during pregnancy. MOB reports she smoked marijuana prior to knowing that she was pregnant, stating she smoked marijuana for the first 3 months of pregnancy. MOB reports she smoked marijuana 1x/day because it helped her anxiety, and helped her to relax and have an appetite. MOB denied using other illicit substances while pregnant.  CSW provided education regarding the baby blues period vs. perinatal mood disorders, discussed treatment and gave resources for mental health follow up if concerns arise.  CSW recommends self-evaluation during the postpartum time period using the New Mom Checklist from Postpartum Progress and encouraged MOB to contact a medical professional if symptoms are noted at any time.    CSW provided review of Sudden Infant Death Syndrome (SIDS) precautions.    CSW identifies no further need for intervention and no barriers to discharge at this time. CSW Will Continue to Monitor Umbilical Cord Tissue Drug Screen Results and Make Report if Warranted.  CSW Plan/Description:  CSW Will Continue to Monitor Umbilical Cord Tissue Drug Screen Results and Make Report if Warranted, Other Information/Referral to Community Resources, Hospital Drug Screen Policy Information, Perinatal Mood and Anxiety Disorder (PMADs) Education, Sudden Infant Death Syndrome (SIDS) Education, No Further Intervention Required/No Barriers to Discharge    Krista Adams K Krista Adams, LCSWA 08/19/2022, 3:04 PM 

## 2022-08-19 NOTE — Lactation Note (Signed)
This note was copied from a baby's chart. Lactation Consultation Note  Patient Name: Krista Adams Date: 08/19/2022 Reason for consult: Initial assessment;Primapara;1st time breastfeeding;Early term 37-38.6wks;Infant weight loss;Breastfeeding assistance (1 % weight loss.) Age:38 hours LC checked diaper ( dry ). Cotton balls intact.  Baby awake after Dr.exam, and placed baby STS on the Lt Br.  Areola edema noted and showed mom the reverse pressure exercise to elongate the nipple / areola complex for the baby to latch deeper. Baby Latched , opened wide and with tea hold position obtain a deep latch. Still feeding at 12  mins with several swallows. Warm moist compress above baby on the breast , to enhance milk flow. Latch 8.  LC provided report to the [facility] RN to complete the total time for the feeding.  DEBP had already been set up.   Maternal Data Has patient been taught Hand Expression?: Yes Does the patient have breastfeeding experience prior to this delivery?: No  Feeding Mother's Current Feeding Choice: Breast Milk and Formula  LATCH Score Latch: Repeated attempts needed to sustain latch, nipple held in mouth throughout feeding, stimulation needed to elicit sucking reflex.  Audible Swallowing: Spontaneous and intermittent  Type of Nipple: Everted at rest and after stimulation (areola edema noted and showed mom how to do reverse pressure.)  Comfort (Breast/Nipple): Soft / non-tender  Hold (Positioning): Assistance needed to correctly position infant at breast and maintain latch.  LATCH Score: 8   Lactation Tools Discussed/Used Tools: Pump (DEBP was already set up -) Breast pump type: Double-Electric Breast Pump Reason for Pumping: per [facility] RN mentioned Birth parent asked to have the DEBP set up/  Interventions Interventions: Breast feeding basics reviewed;Assisted with latch;Skin to skin;Breast massage;Hand express;Reverse pressure;Breast compression;Adjust  position;Support pillows;Position options;DEBP;Education  Discharge Pump:  (per Birth parent has [employer].LC recommended going on their website and seeing what DEBP is available. also to go on [employer]. com to check the [employer] DEBP out to see if that is what she might like. Kerrin Champagne DEBP ))  Consult Status Consult Status: Follow-up Date: 08/20/22 Follow-up type: In-patient    [place] 08/19/2022, 9:56 AM

## 2022-08-20 LAB — GLUCOSE, CAPILLARY
Glucose-Capillary: 158 mg/dL — ABNORMAL HIGH (ref 70–99)
Glucose-Capillary: 235 mg/dL — ABNORMAL HIGH (ref 70–99)
Glucose-Capillary: 167 mg/dL — ABNORMAL HIGH (ref 70–99)
Glucose-Capillary: 153 mg/dL — ABNORMAL HIGH (ref 70–99)

## 2022-08-20 MED ORDER — KETOROLAC TROMETHAMINE 30 MG/ML IJ SOLN
30.0000 mg | Freq: Once | INTRAMUSCULAR | Status: AC
  Administered 2022-08-20: 30 mg via INTRAVENOUS
  Filled 2022-08-20: qty 1

## 2022-08-20 MED ORDER — METFORMIN HCL ER 500 MG PO TB24
500.0000 mg | ORAL_TABLET | Freq: Two times a day (BID) | ORAL | Status: DC
  Administered 2022-08-20 – 2022-08-21 (×2): 500 mg via ORAL
  Filled 2022-08-20 (×2): qty 1

## 2022-08-20 MED ORDER — CYCLOBENZAPRINE HCL 10 MG PO TABS
10.0000 mg | ORAL_TABLET | Freq: Once | ORAL | Status: AC
  Administered 2022-08-20: 10 mg via ORAL
  Filled 2022-08-20: qty 1

## 2022-08-20 MED ORDER — METOCLOPRAMIDE HCL 5 MG/ML IJ SOLN
10.0000 mg | Freq: Once | INTRAMUSCULAR | Status: AC
  Administered 2022-08-20: 10 mg via INTRAVENOUS
  Filled 2022-08-20: qty 2

## 2022-08-20 MED ORDER — NIFEDIPINE ER OSMOTIC RELEASE 30 MG PO TB24
30.0000 mg | ORAL_TABLET | Freq: Two times a day (BID) | ORAL | Status: DC
  Administered 2022-08-20 – 2022-08-21 (×3): 30 mg via ORAL
  Filled 2022-08-20 (×3): qty 1

## 2022-08-20 MED ORDER — LACTATED RINGERS IV BOLUS
1000.0000 mL | Freq: Once | INTRAVENOUS | Status: AC
  Administered 2022-08-20: 1000 mL via INTRAVENOUS

## 2022-08-20 MED ORDER — INSULIN GLARGINE-YFGN 100 UNIT/ML ~~LOC~~ SOLN
40.0000 [IU] | Freq: Every day | SUBCUTANEOUS | Status: DC
  Administered 2022-08-20: 40 [IU] via SUBCUTANEOUS
  Filled 2022-08-20 (×2): qty 0.4

## 2022-08-20 NOTE — Progress Notes (Signed)
Called by RN, patient having persitent headache, has had tylenol so far this AM.  Also worried about spinal headache, but appears patient had epidural>cesarean, so unless there was a wet tap I think this unlikely.  Trial 1L LR, reglan IV 10 mg, toradol '30mg'$  IV, flexeril '10mg'$  PO.    Clarnce Flock, MD/MPH Attending Family Medicine Physician, [facility] for [facility], [place]  11:34 AM 08/20/22

## 2022-08-20 NOTE — Inpatient Diabetes Management (Signed)
Inpatient Diabetes Program Recommendations  AACE/ADA: New Consensus Statement on Inpatient Glycemic Control (2015)  Target Ranges:  Prepandial:   less than 140 mg/dL      Peak postprandial:   less than 180 mg/dL (1-2 hours)      Critically ill patients:  140 - 180 mg/dL   Lab Results  Component Value Date   GLUCAP 235 (H) 08/20/2022   HGBA1C 6.3 (H) 08/15/2022    Review of Glycemic Control  Latest Reference Range & Units 08/19/22 23:16 08/20/22 06:00 08/20/22 11:44  Glucose-Capillary 70 - 99 mg/dL 184 (H) 158 (H) 235 (H)  (H): Data is abnormally high Diabetes history: DM2 Outpatient Diabetes medications: Basaglar 65 units am, 60 units pm, Novolog tid meal coverage ? dose Current orders for Inpatient glycemic control: Lantus 30 units QHS, Metformin 500 mg QA, Novolog 0-14 units tid post meals  Decadron 4 mg x 1  Inpatient Diabetes Program Recommendations:    Consider: -Increase Semglee to 40 units qd -Change Novolog correction to 0-15 units ac meals tid, 0-5 units hs - Increase Metformin to 500 mg BID  Thanks, Bronson Curb, MSN, RNC-OB Diabetes Coordinator [phone] (8a-5p)

## 2022-08-20 NOTE — Progress Notes (Signed)
Subjective: Postpartum Day 2: Cesarean Delivery Patient reports incisional pain, tolerating PO, and + flatus.    Objective: Vital signs in last 24 hours: Temp:  [97.5 F (36.4 C)-98.1 F (36.7 C)] 97.5 F (36.4 C) (09/18 0321) Pulse Rate:  [57-63] 60 (09/18 0321) Resp:  [16-20] 20 (09/18 0321) BP: (120-155)/(44-63) 140/63 (09/18 0321) SpO2:  [96 %-99 %] 98 % (09/18 0321) Vitals:   08/19/22 1540 08/19/22 2020 08/19/22 2309 08/20/22 0321  BP:  (!) 155/58 (!) 121/46 (!) 140/63  Pulse: (!) 57 (!) 57 61 60  Resp: '18 18 18 20  '$ Temp: 98 F (36.7 C) 97.8 F (36.6 C) 97.7 F (36.5 C) (!) 97.5 F (36.4 C)  TempSrc: Oral Oral Oral Oral  SpO2: 99% 98% 98% 98%  Weight:      Height:        Intake/Output Summary (Last 24 hours) at 08/20/2022 0657 Last data filed at 08/20/2022 0440 Gross per 24 hour  Intake 2759.67 ml  Output 2900 ml  Net -140.33 ml    Physical Exam:  General: alert, cooperative, and appears stated age 36: appropriate Uterine Fundus: firm Incision: no significant drainage DVT Evaluation: No evidence of DVT seen on physical exam.  Recent Labs    08/18/22 2200 08/19/22 0417  HGB 10.4* 9.7*  HCT 30.1* 28.7*   CBG (last 3)  Recent Labs    08/19/22 1540 08/19/22 2316 08/20/22 0600  GLUCAP 183* 184* 158*    Assessment/Plan: Status post Cesarean section. Doing well postoperatively.  BP is elevated. On lasix. Add nifedipine CBGs are not well controlled on Metformin, semglee and SSI. Consider meal coverage. On Rebelsius prior to pregnancy. Breast feeding Desires IUD  Donnamae Jude, MD 08/20/2022, 6:56 AM

## 2022-08-21 ENCOUNTER — Other Ambulatory Visit: Payer: Self-pay | Admitting: Obstetrics and Gynecology

## 2022-08-21 LAB — GLUCOSE, CAPILLARY
Glucose-Capillary: 83 mg/dL (ref 70–99)
Glucose-Capillary: 208 mg/dL — ABNORMAL HIGH (ref 70–99)

## 2022-08-21 MED ORDER — IBUPROFEN 600 MG PO TABS
600.0000 mg | ORAL_TABLET | Freq: Four times a day (QID) | ORAL | Status: DC
  Administered 2022-08-21: 600 mg via ORAL

## 2022-08-21 MED ORDER — IBUPROFEN 600 MG PO TABS: 600.0000 mg | ORAL_TABLET | Freq: Four times a day (QID) | ORAL | 0 refills | Status: DC

## 2022-08-21 MED ORDER — METFORMIN HCL ER 500 MG PO TB24
1000.0000 mg | ORAL_TABLET | Freq: Two times a day (BID) | ORAL | Status: DC
  Filled 2022-08-21: qty 2

## 2022-08-21 MED ORDER — NIFEDIPINE ER 30 MG PO TB24: 30.0000 mg | ORAL_TABLET | Freq: Two times a day (BID) | ORAL | 1 refills | Status: DC

## 2022-08-21 MED ORDER — OXYCODONE HCL 5 MG PO TABS: 5.0000 mg | ORAL_TABLET | ORAL | 0 refills | Status: DC | PRN

## 2022-08-21 MED ORDER — IBUPROFEN 600 MG PO TABS
600.0000 mg | ORAL_TABLET | Freq: Four times a day (QID) | ORAL | Status: DC | PRN
  Filled 2022-08-21: qty 1

## 2022-08-21 MED ORDER — FUROSEMIDE 20 MG PO TABS: 20.0000 mg | ORAL_TABLET | Freq: Two times a day (BID) | ORAL | 0 refills | Status: DC

## 2022-08-21 MED ORDER — METFORMIN HCL ER (OSM) 1000 MG PO TB24: 1000.0000 mg | ORAL_TABLET | Freq: Two times a day (BID) | ORAL | 2 refills | Status: DC

## 2022-08-21 MED ORDER — INSULIN GLARGINE-YFGN 100 UNIT/ML ~~LOC~~ SOLN: 40.0000 [IU] | Freq: Every day | SUBCUTANEOUS | 11 refills | Status: DC

## 2022-08-21 NOTE — Progress Notes (Signed)
Discharge teaching complete  home care for wound vac done all questions answered

## 2022-08-22 ENCOUNTER — Telehealth: Payer: Self-pay

## 2022-08-22 NOTE — Telephone Encounter (Signed)
Transition Care Management Follow-up Telephone Call Date of discharge and from where: 08/21/2022 [facility]  How have you been since you were released from the hospital? Pt states she had her baby early she is doing fine. Any questions or concerns? No  Items Reviewed: Did the pt receive and understand the discharge instructions provided? Yes  Medications obtained and verified? Yes  Other? No  Any new allergies since your discharge? No  Dietary orders reviewed? Yes Do you have support at home? Yes   Home Care and Equipment/Supplies: Were home health services ordered? no If so, what is the name of the agency? N/a  Has the agency set up a time to come to the patient's home? no Were any new equipment or medical supplies ordered?  No What is the name of the medical supply agency? N/a Were you able to get the supplies/equipment? no Do you have any questions related to the use of the equipment or supplies? No  Functional Questionnaire: (I = Independent and D = Dependent) ADLs: i  Bathing/Dressing- i  Meal Prep- i  Eating- i  Maintaining continence- i  Transferring/Ambulation- i  Managing Meds- i  Follow up appointments reviewed:  PCP Hospital f/u appt confirmed? Yes  Scheduled to see [name] on 10/08/2022 @ [facility]. [facility] f/u appt confirmed? No  Scheduled to see n/a on  @ n/a. Are transportation arrangements needed? No  If their condition worsens, is the pt aware to call PCP or go to the Emergency Dept.? Yes Was the patient provided with contact information for the PCP's office or ED? Yes Was to pt encouraged to call back with questions or concerns? Yes

## 2022-08-22 NOTE — Telephone Encounter (Signed)
error 

## 2022-08-23 ENCOUNTER — Ambulatory Visit: Payer: BC Managed Care – PPO

## 2022-08-27 ENCOUNTER — Ambulatory Visit (INDEPENDENT_AMBULATORY_CARE_PROVIDER_SITE_OTHER): Payer: BC Managed Care – PPO | Admitting: *Deleted

## 2022-08-27 ENCOUNTER — Other Ambulatory Visit: Payer: Self-pay | Admitting: Obstetrics and Gynecology

## 2022-08-27 VITALS — BP 153/86 | HR 63

## 2022-08-27 DIAGNOSIS — O10913 Unspecified pre-existing hypertension complicating pregnancy, third trimester: Secondary | ICD-10-CM

## 2022-08-27 MED ORDER — NIFEDIPINE ER 60 MG PO TB24: 60.0000 mg | ORAL_TABLET | Freq: Two times a day (BID) | ORAL | 1 refills | Status: DC

## 2022-08-27 NOTE — Progress Notes (Signed)
Subjective:  Krista Adams is a 36 y.o. female here for BP check and incision check.  Hypertension ROS: taking medications as instructed, no medication side effects noted, no TIA's, no chest pain on exertion, no dyspnea on exertion, and no swelling of ankles.   Pt reports incision healing well  Objective:  LMP  (LMP Unknown)   Appearance alert, well appearing, and in no distress, oriented to person, place, and time, and overweight. Incision healing well, no significant drainage, no dehiscence, no significant erythema   Assessment:   Blood Pressure today in office poorly controlled.  Incision separation of edges. Wound vac removed. Slight separation of edges at outermost layer of skin. No drainage or signs of infection. Adhesive removed. Wound cleansed.  Plan:  Orders and follow up as documented in patient record. Dr. Elly Modena was consulted. RX 60 mg Procardia BID sent by Dr. Elly Modena. Pt to return for BP check in 1 week. TC to pt to inform of above. Verbalized understanding.

## 2022-09-03 ENCOUNTER — Ambulatory Visit (INDEPENDENT_AMBULATORY_CARE_PROVIDER_SITE_OTHER): Payer: BC Managed Care – PPO | Admitting: General Practice

## 2022-09-03 ENCOUNTER — Encounter: Payer: Self-pay | Admitting: Licensed Clinical Social Worker

## 2022-09-03 ENCOUNTER — Ambulatory Visit (INDEPENDENT_AMBULATORY_CARE_PROVIDER_SITE_OTHER): Payer: BC Managed Care – PPO | Admitting: Licensed Clinical Social Worker

## 2022-09-03 DIAGNOSIS — F4322 Adjustment disorder with anxiety: Secondary | ICD-10-CM

## 2022-09-03 DIAGNOSIS — O119 Pre-existing hypertension with pre-eclampsia, unspecified trimester: Secondary | ICD-10-CM

## 2022-09-03 NOTE — BH Specialist Note (Signed)
Integrated Behavioral Health Initial In-Person Visit  MRN: [id number] Name: Krista Adams  Number of [facility] Clinician visits: 1 Session Start time: 2:30pm   Session End time: 3:05pm Total time in minutes: 35 mins in person at [facility]   Types of Service: [place] (BHI)  Interpretor:No. Interpretor Name and Language: None   Warm Hand Off Completed.        Subjective: Krista Adams is a 36 y.o. female accompanied by n/a Patient was referred by Hospital discharge for at risk for ppd . 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  Self-Care: n/a Life Changes: Newborn   Patient and/or Family's Strengths/Protective Factors: Concrete supports in place (healthy food, safe environments, etc.)  Goals Addressed: Patient will: Reduce symptoms of: stress Increase knowledge and/or ability of: coping skills and stress reduction  Demonstrate ability to: Increase healthy adjustment to current life circumstances  Progress towards Goals: Ongoing  Interventions: Interventions utilized: Motivational Interviewing  Standardized Assessments completed: [place] Postnatal Depression  Patient and/or Family Response: Krista Adams responded well to visit. Krista Adams reported back pain and sharp pain in vaginal area. LCSW A.Linton Rump advised Krista Adams go to [facility] for evaluation of pain. Krista Adams reports she will not go to [facility] and would wait til 10/09/2022 visit.     Assessment: Patient currently experiencing adjustment disorder .   Patient may benefit from integrated behavioral health.  Plan: Follow up with behavioral  health clinician on : 10/09/2022 Behavioral recommendations: Prioritize rest, visit mau for evaluation for back pain, delegate task to prevent burnout, communicate expectation with spouse  Referral(s): [place] (In Clinic) "From scale of 1-10, how likely are you to follow plan?":    Krista Ferrier, LCSW

## 2022-09-03 NOTE — Progress Notes (Signed)
Subjective:  Krista Adams is a 36 y.o. female here for BP check.   Hypertension ROS: taking medications as instructed, no medication side effects noted, no TIA's, no chest pain on exertion, no dyspnea on exertion, and no swelling of ankles.    Objective:  LMP  (LMP Unknown)   Appearance alert, well appearing, and in no distress. General exam BP noted to be well controlled today in office.    Assessment:   Blood Pressure stable.   Plan:  Current treatment plan is effective, no change in therapy.Marland Kitchen

## 2022-09-09 ENCOUNTER — Other Ambulatory Visit: Payer: Self-pay | Admitting: Obstetrics and Gynecology

## 2022-09-09 DIAGNOSIS — N644 Mastodynia: Secondary | ICD-10-CM

## 2022-09-20 ENCOUNTER — Other Ambulatory Visit: Payer: Self-pay | Admitting: Obstetrics and Gynecology

## 2022-10-08 ENCOUNTER — Encounter: Payer: Self-pay | Admitting: Nurse Practitioner

## 2022-10-08 ENCOUNTER — Ambulatory Visit (INDEPENDENT_AMBULATORY_CARE_PROVIDER_SITE_OTHER): Payer: BC Managed Care – PPO | Admitting: Nurse Practitioner

## 2022-10-08 VITALS — BP 128/60 | HR 68 | Temp 98.2°F | Ht 64.0 in | Wt 266.0 lb

## 2022-10-08 DIAGNOSIS — I10 Essential (primary) hypertension: Secondary | ICD-10-CM | POA: Diagnosis not present

## 2022-10-08 DIAGNOSIS — E1169 Type 2 diabetes mellitus with other specified complication: Secondary | ICD-10-CM | POA: Diagnosis not present

## 2022-10-08 DIAGNOSIS — E669 Obesity, unspecified: Secondary | ICD-10-CM

## 2022-10-08 DIAGNOSIS — L659 Nonscarring hair loss, unspecified: Secondary | ICD-10-CM | POA: Diagnosis not present

## 2022-10-08 NOTE — Progress Notes (Signed)
I,Krista Adams,acting as a Education administrator for [employer], FNP.,have documented all relevant documentation on the behalf of Krista Brine, FNP,as directed by  Krista Brine, FNP while in the presence of Krista Adams, [place].  Subjective:     Patient ID: Krista Adams , female    DOB: [birth year] , 36 y.o.   MRN: [id number]   Chief Complaint  Patient presents with   Diabetes    HPI  Patient presents today for DM and HTN follow up. She is 8 weeks postpartum after having a daughter.  She is taking 30 units at bedtime of tresiba, novolog sliding scale. Continues to take metformin the endocrinologist wanted to increase her dose but she is not able to tolerate the increase. The endocrinologist wants to wait a little for her to completely heal before starting a GLP-1. She is due to see OB tomorrow and is currently out of work.   She is on nifedipine twice a day. She had an infection while in labor. She had a long labor. She was treated with an antibiotic.   Diabetes She presents for her follow-up diabetic visit. She has type 2 diabetes mellitus. Her disease course has been improving. Pertinent negatives for hypoglycemia include no dizziness, headaches or nervousness/anxiousness. Pertinent negatives for diabetes include no chest pain, no fatigue, no polydipsia, no polyphagia and no polyuria. There are no hypoglycemic complications. There are no diabetic complications. Risk factors for coronary artery disease include diabetes mellitus, obesity and sedentary lifestyle. Current diabetic treatment includes oral agent (dual therapy). She is compliant with treatment all of the time. She is following a generally unhealthy diet. When asked about meal planning, she reported none. She has not had a previous visit with a dietitian. She rarely participates in exercise. There is no change in her home blood glucose trend. (She has seen the Endocrinologist on October 30th and her HgbA1c was better in the 5's. ) An ACE  inhibitor/angiotensin II receptor blocker is being taken. She does not see a podiatrist.Eye exam is not current.  Hypertension This is a chronic problem. The current episode started more than 1 year ago. The problem has been gradually improving since onset. Pertinent negatives include no chest pain, headaches or palpitations. Risk factors for coronary artery disease include obesity and sedentary lifestyle. Compliance problems include exercise.  There is no history of chronic renal disease.     Past Medical History:  Diagnosis Date   Anovulatory (dysfunctional uterine) bleeding 03/13/2012   Depression with anxiety 03/18/2012   DM (diabetes mellitus), type 2, uncontrolled 05/06/2008   Qualifier: Diagnosis of  By: Drue Flirt  MD, Krista Adams     Essential hypertension, benign 05/06/2008   Qualifier: Diagnosis of  By: Jamal Collin MD, Nathan     Morbid obesity ([place]) 05/06/2008   Qualifier: Diagnosis of  By: Jamal Collin MD, Ovid Curd       Family History  Problem Relation Age of Onset   Diabetes Mother    Hypertension Mother    Hypertension Father    Asthma Brother    Asthma Maternal Aunt    Diabetes Maternal Aunt    Cancer Paternal Aunt    Cancer Paternal Uncle    Diabetes Paternal Uncle    Stroke Maternal Grandmother    Diabetes Maternal Grandmother    Cancer Paternal Grandmother    Asthma Other    Birth defects Neg Hx    Heart disease Neg Hx      Current Outpatient Medications:    ACCU-CHEK AVIVA PLUS test strip,  USE TO CHECK BLOOD SUGARS TWICE DAILY E11.69, Disp: 100 strip, Rfl: 2   blood glucose meter kit and supplies KIT, Dispense based on patient and insurance preference. Use up to four times daily as directed. (FOR ICD-9 250.00, 250.01)., Disp: 1 each, Rfl: 0   famotidine (PEPCID) 20 MG tablet, Take 1 tablet (20 mg total) by mouth 2 (two) times daily., Disp: 30 tablet, Rfl: 2   ibuprofen (ADVIL) 600 MG tablet, Take 1 tablet (600 mg total) by mouth every 6 (six) hours., Disp: 30 tablet, Rfl:  0   insulin aspart (NOVOLOG) 100 UNIT/ML injection, Inject into the skin 3 (three) times daily before meals., Disp: , Rfl:    insulin degludec (TRESIBA FLEXTOUCH) 100 UNIT/ML FlexTouch Pen, Inject 40 Units into the skin at bedtime. (Patient taking differently: Inject 30 Units into the skin at bedtime.), Disp: 3 mL, Rfl: 1   metFORMIN (FORTAMET) 1000 MG (OSM) 24 hr tablet, Take 1 tablet (1,000 mg total) by mouth 2 (two) times daily with a meal., Disp: 60 tablet, Rfl: 2   NIFEdipine (ADALAT CC) 60 MG 24 hr tablet, Take 1 tablet (60 mg total) by mouth 2 (two) times daily., Disp: 60 tablet, Rfl: 1   Allergies  Allergen Reactions   Pineapple     itching     Review of Systems  Constitutional: Negative.  Negative for fatigue.  Respiratory: Negative.    Cardiovascular: Negative.  Negative for chest pain and palpitations.  Gastrointestinal: Negative.   Endocrine: Negative for polydipsia, polyphagia and polyuria.  Neurological: Negative.  Negative for dizziness and headaches.  Psychiatric/Behavioral: Negative.  The patient is not nervous/anxious.      Today's Vitals   10/08/22 1429  BP: 128/60  Pulse: 68  Temp: 98.2 F (36.8 C)  TempSrc: Oral  Weight: 266 lb (120.7 kg)  Height: _0  (1.626 m)   Body mass index is 45.66 kg/m.  Wt Readings from Last 3 Encounters:  10/09/22 261 lb 14.4 oz (118.8 kg)  10/08/22 266 lb (120.7 kg)  09/03/22 271 lb 3.2 oz (123 kg)    Objective:  Physical Exam Vitals reviewed.  Constitutional:      Appearance: Normal appearance.  Cardiovascular:     Rate and Rhythm: Normal rate and regular rhythm.     Pulses: Normal pulses.     Heart sounds: Normal heart sounds. No murmur heard. Pulmonary:     Effort: Pulmonary effort is normal. No respiratory distress.     Breath sounds: Normal breath sounds. No wheezing.  Skin:    General: Skin is warm and dry.     Capillary Refill: Capillary refill takes less than 2 seconds.  Neurological:     General: No  focal deficit present.     Mental Status: She is alert and oriented to person, place, and time.     Cranial Nerves: No cranial nerve deficit.     Motor: No weakness.  Psychiatric:        Mood and Affect: Mood normal.        Behavior: Behavior normal.        Thought Content: Thought content normal.        Judgment: Judgment normal.         Assessment And Plan:     1. Diabetes mellitus type 2 in obese [facility] Comments: Diabetes foot exam done. Continues with Tyler Aas since having her baby will recheck HgbA1c. - Microalbumin / Creatinine Urine Ratio  2. Essential hypertension, benign Comments: Blood pressure is  well controlled. Continue current medications  3. Morbid obesity ([place]) Comments: She has lost 30 lbs since having her daughter. Continue focusing on weight loss and exercise as tolerated.She is encouraged to strive for BMI less than 30 to decrease cardiac risk. Advised to aim for at least 150 minutes of exercise per week.  4. Hair loss Comments: She had a referral to Dermatology last year. Will check for metabolic causes. - CBC with Differential/Platelet - Iron, TIBC and Ferritin Panel - TSH - Vitamin B12 - VITAMIN D 25 Hydroxy (Vit-D Deficiency, Fractures)    Patient was given opportunity to ask questions. Patient verbalized understanding of the plan and was able to repeat key elements of the plan. All questions were answered to their satisfaction.  Krista Brine, FNP   I, Krista Brine, FNP, have reviewed all documentation for this visit. The documentation on 10/04/22 for the exam, diagnosis, procedures, and orders are all accurate and complete.   IF YOU HAVE BEEN REFERRED TO A SPECIALIST, IT MAY TAKE 1-2 WEEKS TO SCHEDULE/PROCESS THE REFERRAL. IF YOU HAVE NOT HEARD FROM US/SPECIALIST IN TWO WEEKS, PLEASE GIVE [place] A CALL AT [phone].   THE PATIENT IS ENCOURAGED TO PRACTICE SOCIAL DISTANCING DUE TO THE COVID-19 PANDEMIC.

## 2022-10-08 NOTE — Patient Instructions (Signed)

## 2022-10-09 ENCOUNTER — Ambulatory Visit (INDEPENDENT_AMBULATORY_CARE_PROVIDER_SITE_OTHER): Payer: BC Managed Care – PPO | Admitting: Obstetrics and Gynecology

## 2022-10-09 ENCOUNTER — Encounter: Payer: Self-pay | Admitting: Obstetrics and Gynecology

## 2022-10-09 ENCOUNTER — Encounter: Payer: BC Managed Care – PPO | Admitting: Nurse Practitioner

## 2022-10-09 ENCOUNTER — Other Ambulatory Visit: Payer: Self-pay

## 2022-10-09 ENCOUNTER — Other Ambulatory Visit (HOSPITAL_COMMUNITY)
Admission: RE | Admit: 2022-10-09 | Discharge: 2022-10-09 | Disposition: A | Discharge status: Home / Self Care | Payer: BC Managed Care – PPO | Source: Ambulatory Visit | Attending: Obstetrics and Gynecology | Admitting: Obstetrics and Gynecology

## 2022-10-09 DIAGNOSIS — Z30430 Encounter for insertion of intrauterine contraceptive device: Secondary | ICD-10-CM | POA: Diagnosis not present

## 2022-10-09 LAB — CBC WITH DIFFERENTIAL/PLATELET
Absolute Monocytes: 398 cells/uL (ref 200–950)
Basophils Absolute: 28 cells/uL (ref 0–200)
Basophils Relative: 0.5 %
Eosinophils Absolute: 78 cells/uL (ref 15–500)
Eosinophils Relative: 1.4 %
HCT: 35.9 % (ref 35.0–45.0)
Hemoglobin: 11.6 g/dL — ABNORMAL LOW (ref 11.7–15.5)
Lymphs Abs: 1859 cells/uL (ref 850–3900)
MCH: 27.9 pg (ref 27.0–33.0)
MCHC: 32.3 g/dL (ref 32.0–36.0)
MCV: 86.3 fL (ref 80.0–100.0)
MPV: 10.2 fL (ref 7.5–12.5)
Monocytes Relative: 7.1 %
Neutro Abs: 3237 cells/uL (ref 1500–7800)
Neutrophils Relative %: 57.8 %
Platelets: 375 10*3/uL (ref 140–400)
RBC: 4.16 10*6/uL (ref 3.80–5.10)
RDW: 13.8 % (ref 11.0–15.0)
Total Lymphocyte: 33.2 %
WBC: 5.6 10*3/uL (ref 3.8–10.8)

## 2022-10-09 LAB — IRON,TIBC AND FERRITIN PANEL
%SAT: 18 % (calc) (ref 16–45)
Ferritin: 39 ng/mL (ref 16–154)
Iron: 54 ug/dL (ref 40–190)
TIBC: 303 mcg/dL (calc) (ref 250–450)

## 2022-10-09 LAB — VITAMIN D 25 HYDROXY (VIT D DEFICIENCY, FRACTURES): Vit D, 25-Hydroxy: 34 ng/mL (ref 30–100)

## 2022-10-09 LAB — TSH: TSH: 1.38 mIU/L

## 2022-10-09 LAB — EXTRA URINE SPECIMEN

## 2022-10-09 LAB — POCT URINE PREGNANCY: Preg Test, Ur: NEGATIVE

## 2022-10-09 LAB — VITAMIN B12: Vitamin B-12: 1187 pg/mL — ABNORMAL HIGH (ref 200–1100)

## 2022-10-09 MED ORDER — LEVONORGESTREL 20 MCG/DAY IU IUD
1.0000 | INTRAUTERINE_SYSTEM | Freq: Once | INTRAUTERINE | Status: AC
  Administered 2022-10-09: 1 via INTRAUTERINE

## 2022-10-09 NOTE — Progress Notes (Signed)
[facility] Partum Visit Note  Krista Adams is a 36 y.o. G3P1001 female who presents for a postpartum visit. She is 7 weeks postpartum following a primary cesarean section.  I have fully reviewed the prenatal and intrapartum course. The delivery was at 62 gestational weeks.  Anesthesia: epidural. Postpartum course has been good. Baby is doing well. Baby is feeding by bottle - Similac Advance. Bleeding  on period . Bowel function is normal. Bladder function is normal. Patient is not sexually active. Contraception method is IUD. Postpartum depression screening: negative.   The pregnancy intention screening data noted above was reviewed. Potential methods of contraception were discussed. The patient elected to proceed with No data recorded.    Health Maintenance Due  Topic Date Due   COVID-19 Vaccine (3 - Pfizer risk series) 05/04/2020   OPHTHALMOLOGY EXAM  07/27/2022   FOOT EXAM  09/26/2022    Medical records  Review of Systems Pertinent items noted in HPI and remainder of comprehensive ROS otherwise negative.  Objective:  LMP  (LMP Unknown)    General:  alert   Breasts:  not indicated  Lungs: clear to auscultation bilaterally  Heart:  regular rate and rhythm, S1, S2 normal, no murmur, click, rub or gallop  Abdomen: soft, non-tender; bowel sounds normal; no masses,  no organomegaly   Wound well approximated incision  GU exam:  normal       Assessment:    There are no diagnoses linked to this encounter.  NL postpartum exam.  CHTN Type 2 DM  Plan:   Essential components of care per ACOG recommendations:  1.  Mood and well being: Patient with negative depression screening today. Reviewed local resources for support.  - Patient tobacco use? No.   - hx of drug use? No.    2. Infant care and feeding:  -Patient currently breastmilk feeding? No.  -Social determinants of health (SDOH) reviewed in EPIC. No concerns  3. Sexuality, contraception and birth spacing - Patient  does not want a pregnancy in the next year.  Desired family size is uncrtain children.  - Reviewed reproductive life planning. Reviewed contraceptive methods based on pt preferences and effectiveness.  Patient desired IUD or IUS today.   - Discussed birth spacing of 18 months  4. Sleep and fatigue -Encouraged family/partner/community support of 4 hrs of uninterrupted sleep to help with mood and fatigue  5. Physical Recovery  - Discussed patients delivery and complications. She describes her labor as good. - Patient had a C-section. - Patient has urinary incontinence? No. - Patient is safe to resume physical and sexual activity  6.  Health Maintenance - HM due items addressed Yes - Last pap smear  Diagnosis  Date Value Ref Range Status  09/26/2021 (A)  Final   - Atypical squamous cells of undetermined significance (ASC-US)   Pap smear done at today's visit.  -Breast Cancer screening indicated? No.   7. Chronic Disease/Pregnancy Condition follow up:  Pt has seen PCP since delivery for her DM and CHTN.    GYNECOLOGY CLINIC PROCEDURE NOTE   IUD Insertion Procedure Note Patient identified, informed consent performed, consent signed.   Discussed risks of irregular bleeding, cramping, infection, malpositioning or misplacement of the IUD outside the uterus which may require further procedure such as laparoscopy. Time out was performed.  Urine pregnancy test negative.  Speculum placed in the vagina.  Cervix visualized.  Cleaned with Betadine x 2.  Grasped anteriorly with a single tooth tenaculum.  Uterus  sounded to 8 cm.  Mirena IUD placed per manufacturer's recommendations.  Strings trimmed to 3 cm. Tenaculum was removed, good hemostasis noted.  Patient tolerated procedure well.   Patient was given post-procedure instructions.  She was advised to have backup contraception for one week.  Patient was also asked to check IUD strings periodically and follow up in 4 weeks for IUD  check.   Arlina Robes, MD

## 2022-10-09 NOTE — Patient Instructions (Signed)

## 2022-10-09 NOTE — Addendum Note (Signed)
Addended by: Flonnie Hailstone on: 10/09/2022 04:23 PM   Modules accepted: Orders

## 2022-10-15 LAB — CYTOLOGY - PAP
Comment: NEGATIVE
Diagnosis: NEGATIVE
High risk HPV: NEGATIVE

## 2022-11-05 ENCOUNTER — Other Ambulatory Visit: Payer: Self-pay

## 2022-11-05 DIAGNOSIS — Z111 Encounter for screening for respiratory tuberculosis: Secondary | ICD-10-CM

## 2022-11-06 ENCOUNTER — Other Ambulatory Visit: Payer: Self-pay | Admitting: Nurse Practitioner

## 2022-11-06 ENCOUNTER — Ambulatory Visit: Payer: BC Managed Care – PPO | Admitting: Obstetrics and Gynecology

## 2022-11-06 ENCOUNTER — Encounter: Payer: Self-pay | Admitting: Nurse Practitioner

## 2022-11-06 ENCOUNTER — Ambulatory Visit (INDEPENDENT_AMBULATORY_CARE_PROVIDER_SITE_OTHER): Payer: BC Managed Care – PPO | Admitting: Nurse Practitioner

## 2022-11-06 ENCOUNTER — Ambulatory Visit
Admission: RE | Admit: 2022-11-06 | Discharge: 2022-11-06 | Disposition: A | Discharge status: Home / Self Care | Payer: BC Managed Care – PPO | Source: Ambulatory Visit | Attending: Nurse Practitioner | Admitting: Nurse Practitioner

## 2022-11-06 ENCOUNTER — Other Ambulatory Visit: Payer: BC Managed Care – PPO

## 2022-11-06 ENCOUNTER — Telehealth: Payer: Self-pay | Admitting: *Deleted

## 2022-11-06 VITALS — BP 122/90 | HR 67 | Temp 98.1°F | Ht 64.0 in | Wt 261.0 lb

## 2022-11-06 DIAGNOSIS — I10 Essential (primary) hypertension: Secondary | ICD-10-CM | POA: Diagnosis not present

## 2022-11-06 DIAGNOSIS — E669 Obesity, unspecified: Secondary | ICD-10-CM

## 2022-11-06 DIAGNOSIS — M549 Dorsalgia, unspecified: Secondary | ICD-10-CM

## 2022-11-06 DIAGNOSIS — E1169 Type 2 diabetes mellitus with other specified complication: Secondary | ICD-10-CM | POA: Diagnosis not present

## 2022-11-06 DIAGNOSIS — Z6841 Body Mass Index (BMI) 40.0 and over, adult: Secondary | ICD-10-CM

## 2022-11-06 MED ORDER — NAPROXEN 500 MG PO TABS: ORAL_TABLET | ORAL | 2 refills | Status: AC

## 2022-11-06 MED ORDER — KETOROLAC TROMETHAMINE 60 MG/2ML IM SOLN
60.0000 mg | Freq: Once | INTRAMUSCULAR | Status: AC
  Administered 2022-11-06: 60 mg via INTRAMUSCULAR

## 2022-11-06 NOTE — Telephone Encounter (Signed)
[place] reports back pain and difficulty walking and picking up things. She has been seen by her PCP today and is getting an X-ray at [facility]. Her PCP gave her Toradol IM and RX'd naproxen. She wants to know if we can help her with her back pain at her OB/GYN appt today and if we can reschedule to a sooner time. I explained that every slot is full today and that her appointment today is for IUD check. I explained that her back pain is better managed by her PCP, who will refer as needed to other specialists or to PT. Discussed the changes that occur after pregnancy and common reasons for postpartum back pain such as relaxed core muscles that do not support the back as well and stress from carrying the weight of a baby and changing center of balance. Encouraged to follow PCPs advise and to use ice on sore/ inflamed area of back for 20 minute intervals. Pt verbalized understanding. Pt would like to reschedule today's appt. Call transferred to schedulers.

## 2022-11-06 NOTE — Patient Instructions (Signed)
Chronic Back Pain When back pain lasts longer than 3 months, it is called chronic back pain. Pain may get worse at certain times (flare-ups). There are things you can do at home to manage your pain. Follow these instructions at home: Pay attention to any changes in your symptoms. Take these actions to help with your pain: Managing pain and stiffness     If told, put ice on the painful area. Your doctor may tell you to use ice for 24-48 hours after the flare-up starts. To do this: Put ice in a plastic bag. Place a towel between your skin and the bag. Leave the ice on for 20 minutes, 2-3 times a day. If told, put heat on the painful area. Do this as often as told by your doctor. Use the heat source that your doctor recommends, such as a moist heat pack or a heating pad. Place a towel between your skin and the heat source. Leave the heat on for 20-30 minutes. Take off the heat if your skin turns bright red. This is especially important if you are unable to feel pain, heat, or cold. You may have a greater risk of getting burned. Soak in a warm bath. This can help relieve pain. Activity  Avoid bending and other activities that make pain worse. When standing: Keep your upper back and neck straight. Keep your shoulders pulled back. Avoid slouching. When sitting: Keep your back straight. Relax your shoulders. Do not round your shoulders or pull them backward. Do not sit or stand in one place for long periods of time. Take short rest breaks during the day. Lying down or standing is usually better than sitting. Resting can help relieve pain. When sitting or lying down for a long time, do some mild activity or stretching. This will help to prevent stiffness and pain. Get regular exercise. Ask your doctor what activities are safe for you. Do not lift anything that is heavier than 10 lb (4.5 kg) or the limit that you are told, until your doctor says that it is safe. To prevent injury when you lift  things: Bend your knees. Keep the weight close to your body. Avoid twisting. Sleep on a firm mattress. Try lying on your side with your knees slightly bent. If you lie on your back, put a pillow under your knees. Medicines Treatment may include medicines for pain and swelling taken by mouth or put on the skin, prescription pain medicine, or muscle relaxants. Take over-the-counter and prescription medicines only as told by your doctor. Ask your doctor if the medicine prescribed to you: Requires you to avoid driving or using machinery. Can cause trouble pooping (constipation). You may need to take these actions to prevent or treat trouble pooping: Drink enough fluid to keep your pee (urine) pale yellow. Take over-the-counter or prescription medicines. Eat foods that are high in fiber. These include beans, whole grains, and fresh fruits and vegetables. Limit foods that are high in fat and sugars. These include fried or sweet foods. General instructions Do not use any products that contain nicotine or tobacco, such as cigarettes, e-cigarettes, and chewing tobacco. If you need help quitting, ask your doctor. Keep all follow-up visits as told by your doctor. This is important. Contact a doctor if: Your pain does not get better with rest or medicine. Your pain gets worse, or you have new pain. You have a high fever. You lose weight very quickly. You have trouble doing your normal activities. Get help right away   if: One or both of your legs or feet feel weak. One or both of your legs or feet lose feeling (have numbness). You have trouble controlling when you poop (have a bowel movement) or pee (urinate). You have bad back pain and: You feel like you may vomit (nauseous), or you vomit. You have pain in your belly (abdomen). You have shortness of breath. You faint. Summary When back pain lasts longer than 3 months, it is called chronic back pain. Pain may get worse at certain times  (flare-ups). Use ice and heat as told by your doctor. Your doctor may tell you to use ice after flare-ups. This information is not intended to replace advice given to you by your health care provider. Make sure you discuss any questions you have with your health care provider. Document Revised: 12/30/2019 Document Reviewed: 12/30/2019 Elsevier Patient Education  2023 Elsevier Inc.  

## 2022-11-06 NOTE — Progress Notes (Signed)
I,Victoria T Hamilton,acting as a Education administrator for [name], FNP.,have documented all relevant documentation on the behalf of [name], FNP,as directed by  [name], FNP while in the presence of [name], [place].    Subjective:     Patient ID: Krista Adams , female    DOB: [birth year] , 36 y.o.   MRN: [id number]   Chief Complaint  Patient presents with   Back Pain    HPI  Pt presents today for extreme mid back pain. Pain rated 10/10.  She reports it hurts when she breathes, lift her right arm, bending over. Any ROM.   This pain initially started 2 months ago after she had her first child since having her Epidural. She reports it hurts to breath, bend and pick up anything. Right after having her baby she was advised to go back to anesthesiologist but did not go did not want to go to the ER.   Patient adds, taking 3 650 MG tylenol at one time also 600 mg once of ibuprofen which was not effective.  She will have a little bit of relief and then it will come back.   She went back to work this past Monday, she is done working for today.    Back Pain     Past Medical History:  Diagnosis Date   Anovulatory (dysfunctional uterine) bleeding 03/13/2012   Depression with anxiety 03/18/2012   DM (diabetes mellitus), type 2, uncontrolled 05/06/2008   Qualifier: Diagnosis of  By: Drue Flirt  MD, Taineisha     Essential hypertension, benign 05/06/2008   Qualifier: Diagnosis of  By: Jamal Collin MD, Nathan     Morbid obesity ([place]) 05/06/2008   Qualifier: Diagnosis of  By: Jamal Collin MD, Ovid Curd       Family History  Problem Relation Age of Onset   Diabetes Mother    Hypertension Mother    Hypertension Father    Asthma Brother    Asthma Maternal Aunt    Diabetes Maternal Aunt    Cancer Paternal Aunt    Cancer Paternal Uncle    Diabetes Paternal Uncle    Stroke Maternal Grandmother    Diabetes Maternal Grandmother    Cancer Paternal Grandmother    Asthma Other    Birth defects Neg Hx     Heart disease Neg Hx      Current Outpatient Medications:    ACCU-CHEK AVIVA PLUS test strip, USE TO CHECK BLOOD SUGARS TWICE DAILY E11.69, Disp: 100 strip, Rfl: 2   blood glucose meter kit and supplies KIT, Dispense based on patient and insurance preference. Use up to four times daily as directed. (FOR ICD-9 250.00, 250.01)., Disp: 1 each, Rfl: 0   cyclobenzaprine (FLEXERIL) 10 MG tablet, Take 1 tablet (10 mg total) by mouth 3 (three) times daily as needed for muscle spasms., Disp: 30 tablet, Rfl: 0   famotidine (PEPCID) 20 MG tablet, Take 1 tablet (20 mg total) by mouth 2 (two) times daily., Disp: 30 tablet, Rfl: 2   insulin aspart (NOVOLOG) 100 UNIT/ML injection, Inject into the skin 3 (three) times daily before meals., Disp: , Rfl:    insulin degludec (TRESIBA FLEXTOUCH) 100 UNIT/ML FlexTouch Pen, Inject 40 Units into the skin at bedtime. (Patient taking differently: Inject 30 Units into the skin at bedtime.), Disp: 3 mL, Rfl: 1   metFORMIN (FORTAMET) 1000 MG (OSM) 24 hr tablet, Take 1 tablet (1,000 mg total) by mouth 2 (two) times daily with a meal., Disp: 60 tablet, Rfl: 2  naproxen (NAPROSYN) 500 MG tablet, Take 1 tab by mouth twice a day for 5 days then daily as needed., Disp: 60 tablet, Rfl: 2   NIFEdipine (ADALAT CC) 60 MG 24 hr tablet, Take 1 tablet (60 mg total) by mouth in the morning and at bedtime., Disp: 180 tablet, Rfl: 1   Allergies  Allergen Reactions   Pineapple     itching     Review of Systems  Constitutional: Negative.   Respiratory: Negative.    Cardiovascular: Negative.   Musculoskeletal:  Positive for back pain.  Neurological: Negative.   Psychiatric/Behavioral: Negative.       Today's Vitals   11/06/22 0914  BP: (!) 122/90  Pulse: 67  Temp: 98.1 F (36.7 C)  SpO2: 98%  Weight: 261 lb (118.4 kg)  Height: _0  (1.626 m)   Body mass index is 44.8 kg/m.  Wt Readings from Last 3 Encounters:  11/20/22 266 lb (120.7 kg)  11/06/22 261 lb (118.4 kg)   10/09/22 261 lb 14.4 oz (118.8 kg)    Objective:  Physical Exam Vitals reviewed.  Constitutional:      Appearance: Normal appearance.  Cardiovascular:     Rate and Rhythm: Normal rate and regular rhythm.     Pulses: Normal pulses.     Heart sounds: Normal heart sounds. No murmur heard. Pulmonary:     Effort: Pulmonary effort is normal. No respiratory distress.     Breath sounds: Normal breath sounds. No wheezing.  Musculoskeletal:        General: Tenderness (mid back pain and has difficulty with movement) present.  Skin:    General: Skin is warm and dry.     Capillary Refill: Capillary refill takes less than 2 seconds.  Neurological:     General: No focal deficit present.     Mental Status: She is alert and oriented to person, place, and time.     Cranial Nerves: No cranial nerve deficit.     Motor: No weakness.  Psychiatric:        Mood and Affect: Affect is tearful.        Behavior: Behavior normal.        Thought Content: Thought content normal.        Judgment: Judgment normal.         Assessment And Plan:     1. Mid back pain Comments: Tenderness to mid back will treat with flexeril and muscle relaxer. Will also obtain xray. She will be going to her GYN today as well and will inquire if this pain is related to her epidural - ketorolac (TORADOL) injection 60 mg - cyclobenzaprine (FLEXERIL) 10 MG tablet; Take 1 tablet (10 mg total) by mouth 3 (three) times daily as needed for muscle spasms.  Dispense: 30 tablet; Refill: 0  2. Diabetes mellitus type 2 in obese [facility]) Comments: Continue medications as directed. Will hold off on steriods since her blood sugars have been slightly elevated.  3. Essential hypertension, benign Comments: Blood pressure is slightly elevated however may be related to pain.  4. Class 3 severe obesity due to excess calories without serious comorbidity with body mass index (BMI) of 40.0 to 44.9 in adult [facility]) She is encouraged to strive for BMI  less than 30 to decrease cardiac risk. Advised to aim for at least 150 minutes of exercise per week when she is able.    Patient was given opportunity to ask questions. Patient verbalized understanding of the plan and was able to  repeat key elements of the plan. All questions were answered to their satisfaction.  [name], FNP   I, [name], FNP, have reviewed all documentation for this visit. The documentation on 11/06/22 for the exam, diagnosis, procedures, and orders are all accurate and complete.   IF YOU HAVE BEEN REFERRED TO A SPECIALIST, IT MAY TAKE 1-2 WEEKS TO SCHEDULE/PROCESS THE REFERRAL. IF YOU HAVE NOT HEARD FROM US/SPECIALIST IN TWO WEEKS, PLEASE GIVE [place] A CALL AT [phone].   THE PATIENT IS ENCOURAGED TO PRACTICE SOCIAL DISTANCING DUE TO THE COVID-19 PANDEMIC.

## 2022-11-07 MED ORDER — CYCLOBENZAPRINE HCL 10 MG PO TABS: 10.0000 mg | ORAL_TABLET | Freq: Three times a day (TID) | ORAL | 0 refills | Status: DC | PRN

## 2022-11-09 LAB — QUANTIFERON-TB GOLD PLUS
QuantiFERON Mitogen Value: >10 IU/mL
QuantiFERON Nil Value: 0 IU/mL
QuantiFERON TB1 Ag Value: 0 IU/mL
QuantiFERON TB2 Ag Value: 0.01 IU/mL
QuantiFERON-TB Gold Plus: NEGATIVE

## 2022-11-16 ENCOUNTER — Other Ambulatory Visit: Payer: Self-pay | Admitting: Obstetrics and Gynecology

## 2022-11-19 ENCOUNTER — Other Ambulatory Visit: Payer: Self-pay

## 2022-11-20 ENCOUNTER — Ambulatory Visit (INDEPENDENT_AMBULATORY_CARE_PROVIDER_SITE_OTHER): Payer: BC Managed Care – PPO | Admitting: Advanced Practice Midwife

## 2022-11-20 ENCOUNTER — Encounter: Payer: Self-pay | Admitting: Advanced Practice Midwife

## 2022-11-20 VITALS — BP 164/81 | HR 55 | Ht 64.0 in | Wt 266.0 lb

## 2022-11-20 DIAGNOSIS — I10 Essential (primary) hypertension: Secondary | ICD-10-CM | POA: Diagnosis not present

## 2022-11-20 DIAGNOSIS — Z30431 Encounter for routine checking of intrauterine contraceptive device: Secondary | ICD-10-CM | POA: Diagnosis not present

## 2022-11-20 MED ORDER — NIFEDIPINE ER 60 MG PO TB24: 60.0000 mg | ORAL_TABLET | Freq: Two times a day (BID) | ORAL | 1 refills | Status: DC

## 2022-11-20 NOTE — Progress Notes (Signed)
Pt presents for IUD string check. No concerns at this time.   Pt requesting refill of Nifedipine.

## 2022-11-20 NOTE — Progress Notes (Signed)
   GYNECOLOGY PROGRESS NOTE  History:  36 y.o. G1P1001 presents to [place] office today for IUD string check visit. She was on Procardia 60 mg XL BID postpartum and followed up with PCP who did not renew her Rx for medication and reported she should continue with OB/Gyn.  She is out of the medication and has not taken it for 4-5 days. She denies h/a, epigastric pain, visual changes, chest pain, or shortness of breath.      The following portions of the patient's history were reviewed and updated as appropriate: allergies, current medications, past family history, past medical history, past social history, past surgical history and problem list. Last pap smear on 10/09/22 was normal, negative HRHPV.  Health Maintenance Due  Topic Date Due   COVID-19 Vaccine (3 - Pfizer risk series) 05/04/2020   OPHTHALMOLOGY EXAM  07/27/2022   FOOT EXAM  09/26/2022     Review of Systems:  Pertinent items are noted in HPI.   Objective:  Physical Exam Blood pressure (!) 164/81, pulse (!) 55, height '5\' 4"'$  (1.626 m), weight 266 lb (120.7 kg), currently breastfeeding. VS reviewed, nursing note reviewed,  Constitutional: well developed, well nourished, no distress HEENT: normocephalic CV: normal rate Pulm/chest wall: normal effort Breast Exam: deferred Abdomen: soft Neuro: alert and oriented x 3 Skin: warm, dry Psych: affect normal Pelvic exam: Cervix pink, visually closed, without lesion, scant white creamy discharge, vaginal walls and external genitalia normal Bimanual exam: Cervix 0/long/high, firm, anterior, neg CMT, uterus nontender, nonenlarged, adnexa without tenderness, enlargement, or mass  Assessment & Plan:  1. IUD check up --No problems with IUD.   --Strings initially not visible, but with Pap brush, strings teased out and visible now, ~ 2.5 cm in length from cervical os.  --Pt aware that irregular menses may occur with IUD and possible amenorrhea.  IUD effective for 8 years, or pt may  have removed at any time per pt preference.   2. Essential hypertension --Pt to call PCP today and make appt for BP check and medication evaluation.  Pt should be managed for essential HTN by PCP.  --Renewed Rx for nifedipine for 90 day supply with 1 refill.   - NIFEdipine (ADALAT CC) 60 MG 24 hr tablet; Take 1 tablet (60 mg total) by mouth in the morning and at bedtime.  Dispense: 180 tablet; Refill: 1   Return for annual exam.   Fatima Blank, CNM 6:19 PM

## 2022-11-22 ENCOUNTER — Encounter: Payer: BC Managed Care – PPO | Admitting: Nurse Practitioner

## 2022-11-27 ENCOUNTER — Encounter: Payer: Self-pay | Admitting: Nurse Practitioner

## 2023-05-09 ENCOUNTER — Other Ambulatory Visit: Payer: Self-pay | Admitting: Obstetrics and Gynecology

## 2023-07-08 ENCOUNTER — Other Ambulatory Visit: Payer: Self-pay

## 2023-07-08 ENCOUNTER — Other Ambulatory Visit: Payer: Self-pay | Admitting: Nurse Practitioner

## 2023-07-08 DIAGNOSIS — I10 Essential (primary) hypertension: Secondary | ICD-10-CM

## 2023-07-24 ENCOUNTER — Encounter: Payer: Self-pay | Admitting: Nurse Practitioner

## 2023-07-24 ENCOUNTER — Ambulatory Visit: Payer: BC Managed Care – PPO | Admitting: Nurse Practitioner

## 2023-07-24 VITALS — BP 120/64 | HR 64 | Temp 97.8°F | Ht 64.0 in | Wt 249.6 lb

## 2023-07-24 DIAGNOSIS — G44209 Tension-type headache, unspecified, not intractable: Secondary | ICD-10-CM

## 2023-07-24 DIAGNOSIS — E1169 Type 2 diabetes mellitus with other specified complication: Secondary | ICD-10-CM | POA: Diagnosis not present

## 2023-07-24 DIAGNOSIS — Z6841 Body Mass Index (BMI) 40.0 and over, adult: Secondary | ICD-10-CM

## 2023-07-24 DIAGNOSIS — E669 Obesity, unspecified: Secondary | ICD-10-CM | POA: Diagnosis not present

## 2023-07-24 DIAGNOSIS — I10 Essential (primary) hypertension: Secondary | ICD-10-CM | POA: Diagnosis not present

## 2023-07-24 DIAGNOSIS — M549 Dorsalgia, unspecified: Secondary | ICD-10-CM

## 2023-07-24 DIAGNOSIS — K219 Gastro-esophageal reflux disease without esophagitis: Secondary | ICD-10-CM

## 2023-07-24 MED ORDER — FAMOTIDINE 20 MG PO TABS: 20.0000 mg | ORAL_TABLET | Freq: Two times a day (BID) | ORAL | 2 refills | Status: DC

## 2023-07-24 MED ORDER — UBRELVY 50 MG PO TABS: 1.0000 | ORAL_TABLET | Freq: Every day | ORAL | 1 refills | Status: AC

## 2023-07-24 MED ORDER — OMEPRAZOLE MAGNESIUM 20 MG PO TBEC: 20.0000 mg | DELAYED_RELEASE_TABLET | Freq: Every day | ORAL | 2 refills | Status: DC

## 2023-07-24 MED ORDER — CYCLOBENZAPRINE HCL 10 MG PO TABS
10.0000 mg | ORAL_TABLET | Freq: Three times a day (TID) | ORAL | 0 refills | Status: AC | PRN
Start: 2023-07-24 — End: ?

## 2023-07-24 MED ORDER — TRESIBA FLEXTOUCH 100 UNIT/ML ~~LOC~~ SOPN: 16.0000 [IU] | PEN_INJECTOR | Freq: Every day | SUBCUTANEOUS | Status: AC

## 2023-07-24 MED ORDER — KETOROLAC TROMETHAMINE 60 MG/2ML IM SOLN
60.0000 mg | Freq: Once | INTRAMUSCULAR | Status: AC
Start: 2023-07-24 — End: 2023-07-24
  Administered 2023-07-24: 60 mg via INTRAMUSCULAR

## 2023-07-24 MED ORDER — OLMESARTAN MEDOXOMIL 20 MG PO TABS: 20.0000 mg | ORAL_TABLET | Freq: Every day | ORAL | 1 refills | Status: DC

## 2023-07-24 NOTE — Assessment & Plan Note (Signed)
Continue f/u with Endocrinology, last HgbA1c was 6.7 in May, will recheck today and send copy to Dr. Talmage Nap.

## 2023-07-24 NOTE — Assessment & Plan Note (Signed)
Continued back pain will refer to PT for evaluation.

## 2023-07-24 NOTE — Progress Notes (Signed)
Madelaine Bhat, CMA,acting as a Neurosurgeon for Arnette Felts, FNP.,have documented all relevant documentation on the behalf of Arnette Felts, FNP,as directed by  Arnette Felts, FNP while in the presence of Arnette Felts, FNP.  Subjective:  Patient ID: Krista Adams , female    DOB: 1986-11-28 , 37 y.o.   MRN: [id number]  Chief Complaint  Patient presents with   Hypertension   Diabetes    HPI  Patient presents today for a DM and BP follow up, patient reports compliance with medications. Patient denies any chest pain, SOB, or headaches. Patient reports back pain that has been consistent for a while. Patient reports she has not been to the eye doctor.   BP Readings from Last 3 Encounters: 07/24/23 : 120/64 11/20/22 : (!) 164/81 11/06/22 : (!) 122/90    Diabetes She presents for her follow-up diabetic visit. She has type 2 diabetes mellitus. Her disease course has been improving. Hypoglycemia symptoms include headaches. Pertinent negatives for hypoglycemia include no dizziness or nervousness/anxiousness. Pertinent negatives for diabetes include no chest pain, no fatigue, no polydipsia, no polyphagia and no polyuria. There are no hypoglycemic complications. There are no diabetic complications. Risk factors for coronary artery disease include diabetes mellitus, obesity and sedentary lifestyle. Current diabetic treatment includes oral agent (dual therapy). She is compliant with treatment all of the time. She is following a generally unhealthy diet. When asked about meal planning, she reported none. She has not had a previous visit with a dietitian. She rarely participates in exercise. There is no change in her home blood glucose trend. (She has seen the Endocrinologist in May. ) An ACE inhibitor/angiotensin II receptor blocker is being taken. She does not see a podiatrist.Eye exam is not current.  Hypertension This is a chronic problem. The current episode started more than 1 year ago. The problem has  been gradually improving since onset. Associated symptoms include headaches. Pertinent negatives include no chest pain or palpitations. Risk factors for coronary artery disease include obesity and sedentary lifestyle. Compliance problems include exercise.  There is no history of chronic renal disease.  Headache  The pain is located in the Frontal region. The pain does not radiate. Pertinent negatives include no abdominal pain or dizziness. The symptoms are aggravated by bright light. She has tried acetaminophen for the symptoms. Her past medical history is significant for hypertension and obesity. There is no history of cancer or migraine headaches.  Gastroesophageal Reflux She complains of belching. She reports no abdominal pain or no chest pain. This is a recurrent problem. The current episode started more than 1 month ago. The symptoms are aggravated by certain foods. Pertinent negatives include no fatigue. Risk factors include obesity and lack of exercise. She has tried a histamine-2 antagonist for the symptoms. The treatment provided no relief.     Past Medical History:  Diagnosis Date   Anovulatory (dysfunctional uterine) bleeding 03/13/2012   Depression with anxiety 03/18/2012   DM (diabetes mellitus), type 2, uncontrolled 05/06/2008   Qualifier: Diagnosis of  By: Lanier Prude  MD, Taineisha     Essential hypertension, benign 05/06/2008   Qualifier: Diagnosis of  By: Darrol Angel MD, Nathan     Morbid obesity ([facility]) 05/06/2008   Qualifier: Diagnosis of  By: Darrol Angel MD, Harrold Donath       Family History  Problem Relation Age of Onset   Diabetes Mother    Hypertension Mother    Hypertension Father    Asthma Brother    Asthma Maternal Aunt  Diabetes Maternal Aunt    Cancer Paternal Aunt    Cancer Paternal Uncle    Diabetes Paternal Uncle    Stroke Maternal Grandmother    Diabetes Maternal Grandmother    Cancer Paternal Grandmother    Asthma Other    Birth defects Neg Hx    Heart disease Neg Hx       Current Outpatient Medications:    ACCU-CHEK AVIVA PLUS test strip, USE TO CHECK BLOOD SUGARS TWICE DAILY E11.69, Disp: 100 strip, Rfl: 2   blood glucose meter kit and supplies KIT, Dispense based on patient and insurance preference. Use up to four times daily as directed. (FOR ICD-9 250.00, 250.01)., Disp: 1 each, Rfl: 0   insulin aspart (NOVOLOG) 100 UNIT/ML injection, Inject into the skin 3 (three) times daily before meals., Disp: , Rfl:    metFORMIN (FORTAMET) 1000 MG (OSM) 24 hr tablet, Take 1 tablet (1,000 mg total) by mouth 2 (two) times daily with a meal., Disp: 60 tablet, Rfl: 2   naproxen (NAPROSYN) 500 MG tablet, Take 1 tab by mouth twice a day for 5 days then daily as needed., Disp: 60 tablet, Rfl: 2   NIFEdipine (ADALAT CC) 60 MG 24 hr tablet, Take 1 tablet (60 mg total) by mouth in the morning and at bedtime., Disp: 180 tablet, Rfl: 1   olmesartan (BENICAR) 20 MG tablet, Take 1 tablet (20 mg total) by mouth daily., Disp: 90 tablet, Rfl: 1   omeprazole (PRILOSEC OTC) 20 MG tablet, Take 1 tablet (20 mg total) by mouth daily., Disp: 30 tablet, Rfl: 2   Ubrogepant (UBRELVY) 50 MG TABS, Take 1 tablet (50 mg total) by mouth daily., Disp: 16 tablet, Rfl: 1   cyclobenzaprine (FLEXERIL) 10 MG tablet, Take 1 tablet (10 mg total) by mouth 3 (three) times daily as needed for muscle spasms., Disp: 30 tablet, Rfl: 0   insulin degludec (TRESIBA FLEXTOUCH) 100 UNIT/ML FlexTouch Pen, Inject 16 Units into the skin at bedtime., Disp: , Rfl:    Allergies  Allergen Reactions   Pineapple     itching     Review of Systems  Constitutional: Negative.  Negative for fatigue.  HENT: Negative.    Eyes: Negative.   Respiratory: Negative.    Cardiovascular: Negative.  Negative for chest pain and palpitations.  Gastrointestinal:  Negative for abdominal pain.  Endocrine: Negative for polydipsia, polyphagia and polyuria.  Neurological:  Positive for headaches. Negative for dizziness.   Psychiatric/Behavioral: Negative.  The patient is not nervous/anxious.      Today's Vitals   07/24/23 1209  BP: 120/64  Pulse: 64  Temp: 97.8 F (36.6 C)  TempSrc: Oral  Weight: 249 lb 9.6 oz (113.2 kg)  Height: 5\' 4"  (1.626 m)  PainSc: 7   PainLoc: Back   Body mass index is 42.84 kg/m.  Wt Readings from Last 3 Encounters:  07/24/23 249 lb 9.6 oz (113.2 kg)  11/20/22 266 lb (120.7 kg)  11/06/22 261 lb (118.4 kg)    Objective:  Physical Exam Vitals reviewed.  Constitutional:      Appearance: Normal appearance.  Cardiovascular:     Rate and Rhythm: Normal rate and regular rhythm.     Pulses: Normal pulses.     Heart sounds: Normal heart sounds. No murmur heard. Pulmonary:     Effort: Pulmonary effort is normal. No respiratory distress.     Breath sounds: Normal breath sounds. No wheezing.  Skin:    General: Skin is warm and dry.  Capillary Refill: Capillary refill takes less than 2 seconds.  Neurological:     General: No focal deficit present.     Mental Status: She is alert and oriented to person, place, and time.     Cranial Nerves: No cranial nerve deficit.     Motor: No weakness.  Psychiatric:        Mood and Affect: Mood normal.        Behavior: Behavior normal.        Thought Content: Thought content normal.        Judgment: Judgment normal.         Assessment And Plan:  Essential hypertension, benign Assessment & Plan: Blood pressure is well controlled, I will switch her back to Olmesartan since she is no longer pregnant or breast feeding. Blood pressure is controlled.   Orders: -     Olmesartan Medoxomil; Take 1 tablet (20 mg total) by mouth daily.  Dispense: 90 tablet; Refill: 1  Type 2 diabetes mellitus with obesity [facility]) Assessment & Plan: Continue f/u with Endocrinology, last HgbA1c was 6.7 in May, will recheck today and send copy to Dr. Talmage Nap.   Orders: -     Evaristo Bury FlexTouch; Inject 16 Units into the skin at bedtime. -     BASIC  METABOLIC PANEL WITH GFR -     Hemoglobin A1c -     Lipid panel  Acute non intractable tension-type headache Assessment & Plan: Occurring about 3 times a month, will treat with toradol injection today and given ubrelvy, these headaches are resembling migraines.   Orders: Bernita Raisin; Take 1 tablet (50 mg total) by mouth daily.  Dispense: 16 tablet; Refill: 1  Mid back pain Assessment & Plan: Continued back pain will refer to PT for evaluation.   Orders: -     Ambulatory referral to Physical Therapy -     Ketorolac Tromethamine -     Cyclobenzaprine HCl; Take 1 tablet (10 mg total) by mouth 3 (three) times daily as needed for muscle spasms.  Dispense: 30 tablet; Refill: 0  Gastroesophageal reflux disease without esophagitis Assessment & Plan: Rx for omeprazole sent to pharmacy, famotadine has been ineffective.  Orders: -     Omeprazole Magnesium; Take 1 tablet (20 mg total) by mouth daily.  Dispense: 30 tablet; Refill: 2  Mid back pain Assessment & Plan: Continued back pain will refer to PT for evaluation.   Orders: -     Ambulatory referral to Physical Therapy -     Ketorolac Tromethamine -     Cyclobenzaprine HCl; Take 1 tablet (10 mg total) by mouth 3 (three) times daily as needed for muscle spasms.  Dispense: 30 tablet; Refill: 0   Return for uncontrolled bp 3-80months check.   Patient was given opportunity to ask questions. Patient verbalized understanding of the plan and was able to repeat key elements of the plan. All questions were answered to their satisfaction.    Jeanell Sparrow, FNP, have reviewed all documentation for this visit. The documentation on 07/24/23 for the exam, diagnosis, procedures, and orders are all accurate and complete.   IF YOU HAVE BEEN REFERRED TO A SPECIALIST, IT MAY TAKE 1-2 WEEKS TO SCHEDULE/PROCESS THE REFERRAL. IF YOU HAVE NOT HEARD FROM US/SPECIALIST IN TWO WEEKS, PLEASE GIVE [place] A CALL AT [phone].

## 2023-07-24 NOTE — Assessment & Plan Note (Signed)
Rx for omeprazole sent to pharmacy, famotadine has been ineffective.

## 2023-07-24 NOTE — Assessment & Plan Note (Signed)
Occurring about 3 times a month, will treat with toradol injection today and given ubrelvy, these headaches are resembling migraines.

## 2023-07-24 NOTE — Assessment & Plan Note (Signed)
Blood pressure is well controlled, I will switch her back to Olmesartan since she is no longer pregnant or breast feeding. Blood pressure is controlled.

## 2023-07-25 ENCOUNTER — Ambulatory Visit: Payer: Self-pay | Admitting: Nurse Practitioner

## 2023-07-25 LAB — LIPID PANEL
Cholesterol: 159 mg/dL (ref <200)
HDL: 46 mg/dL — ABNORMAL LOW (ref >=50)
LDL Cholesterol (Calc): 93 mg/dL
Non-HDL Cholesterol (Calc): 113 mg/dL (ref <130)
Total CHOL/HDL Ratio: 3.5 (calc) (ref <5.0)
Triglycerides: 102 mg/dL (ref <150)

## 2023-07-25 LAB — BASIC METABOLIC PANEL WITH GFR
BUN: 9 mg/dL (ref 7–25)
CO2: 23 mmol/L (ref 20–32)
Calcium: 9.4 mg/dL (ref 8.6–10.2)
Chloride: 105 mmol/L (ref 98–110)
Creat: 0.76 mg/dL (ref 0.50–0.97)
Glucose, Bld: 165 mg/dL — ABNORMAL HIGH (ref 65–99)
Potassium: 4.1 mmol/L (ref 3.5–5.3)
Sodium: 138 mmol/L (ref 135–146)
eGFR: 102 mL/min/{1.73_m2} (ref >=60)

## 2023-07-25 LAB — HEMOGLOBIN A1C
Hgb A1c MFr Bld: 6.9 %{Hb} — ABNORMAL HIGH (ref <5.7)
Mean Plasma Glucose: 151 mg/dL
eAG (mmol/L): 8.4 mmol/L

## 2023-08-15 LAB — HM DIABETES EYE EXAM

## 2023-10-15 LAB — BASIC METABOLIC PANEL
BUN: 9 (ref 4–21)
CO2: 22 (ref 13–22)
Chloride: 106 (ref 99–108)
Creatinine: 0.7 (ref 0.5–1.1)
Glucose: 110
Potassium: 4.2 meq/L (ref 3.5–5.1)
Sodium: 139 (ref 137–147)

## 2023-10-15 LAB — HEMOGLOBIN A1C: Hemoglobin A1C: 6.5

## 2023-10-15 LAB — COMPREHENSIVE METABOLIC PANEL
Calcium: 8.9 (ref 8.7–10.7)
eGFR: 114

## 2023-10-15 LAB — TSH: TSH: 1.65 (ref 0.41–5.90)

## 2023-10-19 ENCOUNTER — Other Ambulatory Visit: Payer: Self-pay | Admitting: Nurse Practitioner

## 2023-10-19 DIAGNOSIS — K219 Gastro-esophageal reflux disease without esophagitis: Secondary | ICD-10-CM

## 2023-10-23 NOTE — Progress Notes (Signed)
Madelaine Bhat, CMA,acting as a Neurosurgeon for Arnette Felts, FNP.,have documented all relevant documentation on the behalf of Arnette Felts, FNP,as directed by  Arnette Felts, FNP while in the presence of Arnette Felts, FNP.  Subjective:  Patient ID: Krista Adams , female    DOB: Feb 23, 1986 , 37 y.o.   MRN: [id number]  Chief Complaint  Patient presents with   Hypertension    HPI  Patient presents today for a bp and dm follow up, Patient reports compliance with medication. Patient denies any chest pain, SOB, or headaches. Patient has no concerns today. She has a decreased appetite.     Past Medical History:  Diagnosis Date   Anovulatory (dysfunctional uterine) bleeding 03/13/2012   Depression with anxiety 03/18/2012   DM (diabetes mellitus), type 2, uncontrolled 05/06/2008   Qualifier: Diagnosis of  By: Lanier Prude  MD, Taineisha     Essential hypertension, benign 05/06/2008   Qualifier: Diagnosis of  By: Darrol Angel MD, Nathan     Morbid obesity (HCC) 05/06/2008   Qualifier: Diagnosis of  By: Darrol Angel MD, Harrold Donath       Family History  Problem Relation Age of Onset   Diabetes Mother    Hypertension Mother    Hypertension Father    Asthma Brother    Asthma Maternal Aunt    Diabetes Maternal Aunt    Cancer Paternal Aunt    Cancer Paternal Uncle    Diabetes Paternal Uncle    Stroke Maternal Grandmother    Diabetes Maternal Grandmother    Cancer Paternal Grandmother    Asthma Other    Birth defects Neg Hx    Heart disease Neg Hx      Current Outpatient Medications:    ACCU-CHEK AVIVA PLUS test strip, USE TO CHECK BLOOD SUGARS TWICE DAILY E11.69, Disp: 100 strip, Rfl: 2   blood glucose meter kit and supplies KIT, Dispense based on patient and insurance preference. Use up to four times daily as directed. (FOR ICD-9 250.00, 250.01)., Disp: 1 each, Rfl: 0   cyclobenzaprine (FLEXERIL) 10 MG tablet, Take 1 tablet (10 mg total) by mouth 3 (three) times daily as needed for muscle spasms., Disp:  30 tablet, Rfl: 0   insulin aspart (NOVOLOG) 100 UNIT/ML injection, Inject into the skin 3 (three) times daily before meals., Disp: , Rfl:    insulin degludec (TRESIBA FLEXTOUCH) 100 UNIT/ML FlexTouch Pen, Inject 16 Units into the skin at bedtime., Disp: , Rfl:    naproxen (NAPROSYN) 500 MG tablet, Take 1 tab by mouth twice a day for 5 days then daily as needed., Disp: 60 tablet, Rfl: 2   olmesartan (BENICAR) 20 MG tablet, Take 1 tablet (20 mg total) by mouth daily., Disp: 90 tablet, Rfl: 1   omeprazole (PRILOSEC) 20 MG capsule, TAKE 1 CAPSULE BY MOUTH EVERY DAY, Disp: 90 capsule, Rfl: 1   Ubrogepant (UBRELVY) 50 MG TABS, Take 1 tablet (50 mg total) by mouth daily., Disp: 16 tablet, Rfl: 1   Allergies  Allergen Reactions   Pineapple     itching     Review of Systems  Constitutional: Negative.  Negative for fatigue.  HENT: Negative.    Eyes: Negative.   Respiratory: Negative.    Cardiovascular: Negative.  Negative for chest pain, palpitations and leg swelling.  Gastrointestinal:  Negative for abdominal pain.  Endocrine: Negative for polydipsia, polyphagia and polyuria.  Neurological:  Negative for dizziness and headaches.  Psychiatric/Behavioral: Negative.  The patient is not nervous/anxious.  Increased stressors     Today's Vitals   10/24/23 1204  BP: (!) 140/80  Pulse: (!) 47  Temp: 98.2 F (36.8 C)  TempSrc: Oral  Weight: 247 lb 6.4 oz (112.2 kg)  Height: 5\' 4"  (1.626 m)  PainSc: 0-No pain   Body mass index is 42.47 kg/m.  Wt Readings from Last 3 Encounters:  10/24/23 247 lb 6.4 oz (112.2 kg)  07/24/23 249 lb 9.6 oz (113.2 kg)  11/20/22 266 lb (120.7 kg)     Objective:  Physical Exam Vitals reviewed.  Constitutional:      Appearance: Normal appearance.  Cardiovascular:     Rate and Rhythm: Normal rate and regular rhythm.     Pulses: Normal pulses.     Heart sounds: Normal heart sounds. No murmur heard. Pulmonary:     Effort: Pulmonary effort is normal.  No respiratory distress.     Breath sounds: Normal breath sounds. No wheezing.  Musculoskeletal:        General: No signs of injury.  Skin:    General: Skin is warm and dry.     Capillary Refill: Capillary refill takes less than 2 seconds.  Neurological:     General: No focal deficit present.     Mental Status: She is alert and oriented to person, place, and time.     Cranial Nerves: No cranial nerve deficit.     Motor: No weakness.  Psychiatric:        Mood and Affect: Mood normal.        Behavior: Behavior normal.        Thought Content: Thought content normal.        Judgment: Judgment normal.      Assessment And Plan:  Essential hypertension, benign Assessment & Plan: Blood pressure is fairly controlled.    Type 2 diabetes mellitus with obesity (HCC) Assessment & Plan: Continue f/u with Endocrinology, she has had her A1c done at Dr. Talmage Nap   Orders: -     Microalbumin / creatinine urine ratio  COVID-19 vaccination declined Assessment & Plan: Declines covid 19 vaccine. Discussed risk of covid 44 and if she changes her mind about the vaccine to call the office. Education has been provided regarding the importance of this vaccine but patient still declined. Advised may receive this vaccine at local pharmacy or Health Dept.or vaccine clinic. Aware to provide a copy of the vaccination record if obtained from local pharmacy or Health Dept.  Encouraged to take multivitamin, vitamin d, vitamin c and zinc to increase immune system. Aware can call office if would like to have vaccine here at office. Verbalized acceptance and understanding.     Hair loss Assessment & Plan: She has cut her hair off due to balding, will refer to Dermatology for further evaluation  Orders: -     Ambulatory referral to Dermatology  Bradycardia Assessment & Plan: HR 47, confirmed on EKG, this is new will refer to cardiology for further evaluation  Orders: -     EKG 12-Lead  Class 3 severe  obesity due to excess calories without serious comorbidity with body mass index (BMI) of 40.0 to 44.9 in adult [facility]) Assessment & Plan: She is encouraged to strive for BMI less than 30 to decrease cardiac risk. Advised to aim for at least 150 minutes of exercise per week.     Return for controlled DM check 4 months.  Patient was given opportunity to ask questions. Patient verbalized understanding of the plan and was  able to repeat key elements of the plan. All questions were answered to their satisfaction.    Jeanell Sparrow, FNP, have reviewed all documentation for this visit. The documentation on 10/24/23 for the exam, diagnosis, procedures, and orders are all accurate and complete.   IF YOU HAVE BEEN REFERRED TO A SPECIALIST, IT MAY TAKE 1-2 WEEKS TO SCHEDULE/PROCESS THE REFERRAL. IF YOU HAVE NOT HEARD FROM US/SPECIALIST IN TWO WEEKS, PLEASE GIVE [place] A CALL AT [phone] X [phone].

## 2023-10-24 ENCOUNTER — Ambulatory Visit: Payer: BC Managed Care – PPO | Admitting: Nurse Practitioner

## 2023-10-24 ENCOUNTER — Encounter: Payer: Self-pay | Admitting: Nurse Practitioner

## 2023-10-24 VITALS — BP 140/80 | HR 47 | Temp 98.2°F | Ht 64.0 in | Wt 247.4 lb

## 2023-10-24 DIAGNOSIS — Z2821 Immunization not carried out because of patient refusal: Secondary | ICD-10-CM

## 2023-10-24 DIAGNOSIS — I10 Essential (primary) hypertension: Secondary | ICD-10-CM

## 2023-10-24 DIAGNOSIS — L659 Nonscarring hair loss, unspecified: Secondary | ICD-10-CM

## 2023-10-24 DIAGNOSIS — E1169 Type 2 diabetes mellitus with other specified complication: Secondary | ICD-10-CM | POA: Diagnosis not present

## 2023-10-24 DIAGNOSIS — Z6841 Body Mass Index (BMI) 40.0 and over, adult: Secondary | ICD-10-CM

## 2023-10-24 DIAGNOSIS — E669 Obesity, unspecified: Secondary | ICD-10-CM

## 2023-10-24 DIAGNOSIS — E66813 Obesity, class 3: Secondary | ICD-10-CM

## 2023-10-24 DIAGNOSIS — R001 Bradycardia, unspecified: Secondary | ICD-10-CM | POA: Diagnosis not present

## 2023-10-25 LAB — MICROALBUMIN / CREATININE URINE RATIO
Creatinine, Urine: 108.7 mg/dL
Microalb/Creat Ratio: 15 mg/g{creat} (ref 0–29)
Microalbumin, Urine: 16.5 ug/mL

## 2023-10-30 ENCOUNTER — Encounter: Payer: Self-pay | Admitting: Nurse Practitioner

## 2023-10-30 DIAGNOSIS — R001 Bradycardia, unspecified: Secondary | ICD-10-CM | POA: Insufficient documentation

## 2023-10-30 DIAGNOSIS — Z2821 Immunization not carried out because of patient refusal: Secondary | ICD-10-CM | POA: Insufficient documentation

## 2023-10-30 DIAGNOSIS — L659 Nonscarring hair loss, unspecified: Secondary | ICD-10-CM | POA: Insufficient documentation

## 2023-10-30 NOTE — Assessment & Plan Note (Signed)

## 2023-10-30 NOTE — Assessment & Plan Note (Signed)
Blood pressure is fairly controlled.

## 2023-10-30 NOTE — Assessment & Plan Note (Signed)
She has cut her hair off due to balding, will refer to Dermatology for further evaluation

## 2023-10-30 NOTE — Assessment & Plan Note (Signed)
Continue f/u with Endocrinology, she has had her A1c done at Dr. Talmage Nap

## 2023-10-30 NOTE — Assessment & Plan Note (Signed)
HR 47, confirmed on EKG, this is new will refer to cardiology for further evaluation

## 2023-10-30 NOTE — Assessment & Plan Note (Signed)
She is encouraged to strive for BMI less than 30 to decrease cardiac risk. Advised to aim for at least 150 minutes of exercise per week.

## 2024-01-05 ENCOUNTER — Other Ambulatory Visit: Payer: Self-pay | Admitting: Nurse Practitioner

## 2024-01-05 DIAGNOSIS — I10 Essential (primary) hypertension: Secondary | ICD-10-CM

## 2024-01-21 ENCOUNTER — Ambulatory Visit: Payer: BC Managed Care – PPO | Admitting: Internal Medicine

## 2024-02-18 LAB — HM DIABETES EYE EXAM

## 2024-02-25 ENCOUNTER — Ambulatory Visit: Payer: BC Managed Care – PPO | Admitting: Nurse Practitioner

## 2024-02-25 ENCOUNTER — Encounter: Payer: Self-pay | Admitting: Nurse Practitioner

## 2024-03-11 ENCOUNTER — Ambulatory Visit: Attending: Cardiovascular Disease | Admitting: Cardiovascular Disease

## 2024-03-11 ENCOUNTER — Ambulatory Visit: Payer: BC Managed Care – PPO | Admitting: Internal Medicine

## 2024-03-11 ENCOUNTER — Encounter: Payer: Self-pay | Admitting: Cardiovascular Disease

## 2024-03-11 VITALS — BP 188/78 | HR 54 | Ht 64.0 in | Wt 236.4 lb

## 2024-03-11 DIAGNOSIS — R001 Bradycardia, unspecified: Secondary | ICD-10-CM

## 2024-03-11 NOTE — Patient Instructions (Signed)
 Medication Instructions:  No changes *If you need a refill on your cardiac medications before your next appointment, please call your pharmacy*  Lab Work: none  Testing/Procedures: Your physician has requested that you have an echocardiogram. Echocardiography is a painless test that uses sound waves to create images of your heart. It provides your doctor with information about the size and shape of your heart and how well your heart's chambers and valves are working. This procedure takes approximately one hour. There are no restrictions for this procedure. Please do NOT wear cologne, perfume, aftershave, or lotions (deodorant is allowed). Please arrive 15 minutes prior to your appointment time.  Please note: We ask at that you not bring children with you during ultrasound (echo/ vascular) testing. Due to room size and safety concerns, children are not allowed in the ultrasound rooms during exams. Our front office staff cannot provide observation of children in our lobby area while testing is being conducted. An adult accompanying a patient to their appointment will only be allowed in the ultrasound room at the discretion of the ultrasound technician under special circumstances. We apologize for any inconvenience.   Follow-Up: As needed     1st Floor: - Lobby - Registration  - Pharmacy  - Lab - Cafe  2nd Floor: - PV Lab - Diagnostic Testing (echo, CT, nuclear med)  3rd Floor: - Vacant  4th Floor: - TCTS (cardiothoracic surgery) - AFib Clinic - Structural Heart Clinic - Vascular Surgery  - Vascular Ultrasound  5th Floor: - [facility] (general and EP) - Clinical Pharmacy for coumadin, hypertension, lipid, weight-loss medications, and med management appointments    Valet parking services will be available as well.

## 2024-03-11 NOTE — Progress Notes (Signed)
 Chief Complaint  Patient presents with   New Patient (Initial Visit)    Sinus bradycardia   History of Present Illness: 38 yo female with history of anxiety, depression, DM, HTN and obesity who is here today as a new consult for the evaluation of bradycardia. EKG in primary care in November 2024 with sinus bradycardia with rate of 43 bpm, incomplete RBBB and LAFB. She tells me that she feels well. She has rare dizziness with standing. She denies chest pain, dyspnea, near syncope, syncope or LE edema.   Primary Care Physician: Arnette Felts, FNP   Past Medical History:  Diagnosis Date   Anovulatory (dysfunctional uterine) bleeding 03/13/2012   Depression with anxiety 03/18/2012   DM (diabetes mellitus), type 2, uncontrolled 05/06/2008   Qualifier: Diagnosis of  By: Lanier Prude  MD, Taineisha     Essential hypertension, benign 05/06/2008   Qualifier: Diagnosis of  By: Darrol Angel MD, Nathan     Morbid obesity (HCC) 05/06/2008   Qualifier: Diagnosis of  By: Darrol Angel MD, Harrold Donath      Past Surgical History:  Procedure Laterality Date   CESAREAN SECTION N/A 08/18/2022   Procedure: CESAREAN SECTION;  Surgeon: Ogden Bing, MD;  Location: [facility] LD ORS;  Service: Obstetrics;  Laterality: N/A;   CHOLECYSTECTOMY      Current Outpatient Medications  Medication Sig Dispense Refill   ACCU-CHEK AVIVA PLUS test strip USE TO CHECK BLOOD SUGARS TWICE DAILY E11.69 100 strip 2   blood glucose meter kit and supplies KIT Dispense based on patient and insurance preference. Use up to four times daily as directed. (FOR ICD-9 250.00, 250.01). 1 each 0   cyclobenzaprine (FLEXERIL) 10 MG tablet Take 1 tablet (10 mg total) by mouth 3 (three) times daily as needed for muscle spasms. 30 tablet 0   insulin aspart (NOVOLOG) 100 UNIT/ML injection Inject into the skin 3 (three) times daily before meals.     insulin degludec (TRESIBA FLEXTOUCH) 100 UNIT/ML FlexTouch Pen Inject 16 Units into the skin at bedtime.     MOUNJARO  2.5 MG/0.5ML Pen SMARTSIG:2.5 Milligram(s) SUB-Q Every 4 Weeks     naproxen (NAPROSYN) 500 MG tablet Take 1 tab by mouth twice a day for 5 days then daily as needed. 60 tablet 2   olmesartan (BENICAR) 20 MG tablet TAKE 1 TABLET BY MOUTH EVERY DAY 90 tablet 1   omeprazole (PRILOSEC) 20 MG capsule TAKE 1 CAPSULE BY MOUTH EVERY DAY 90 capsule 1   Ubrogepant (UBRELVY) 50 MG TABS Take 1 tablet (50 mg total) by mouth daily. 16 tablet 1   No current facility-administered medications for this visit.    Allergies  Allergen Reactions   Pineapple     itching    Social History   Socioeconomic History   Marital status: Married    Spouse name: Not on file   Number of children: 1   Years of education: Not on file   Highest education level: Not on file  Occupational History   Occupation: CNA  Tobacco Use   Smoking status: Former    Current packs/day: 1.00    Types: Cigars, Cigarettes   Smokeless tobacco: Never   Tobacco comments:    smokes black and mild ; reports not smoking as much  Vaping Use   Vaping status: Former   Quit date: 01/10/2022   Substances: Nicotine, Flavoring  Substance and Sexual Activity   Alcohol use: No   Drug use: Not Currently    Types: Marijuana  Comment: last use  feb 2023   Sexual activity: Yes    Partners: Male    Birth control/protection: None  Other Topics Concern   Not on file  Social History Narrative   Not on file   Social Drivers of Health   Financial Resource Strain: Not on file  Food Insecurity: Food Insecurity Present (08/20/2022)   Hunger Vital Sign    Worried About Running Out of Food in the Last Year: Sometimes true    Ran Out of Food in the Last Year: Sometimes true  Transportation Needs: No Transportation Needs (08/19/2022)   PRAPARE - Administrator, Civil Service (Medical): No    Lack of Transportation (Non-Medical): No  Physical Activity: Not on file  Stress: Not on file  Social Connections: Unknown (04/12/2022)    Received from [facility]   Social Network    Social Network: Not on file  Intimate Partner Violence: Unknown (03/07/2022)   Received from [facility], [facility]   HITS    Physically Hurt: Not on file    Insult or Talk Down To: Not on file    Threaten Physical Harm: Not on file    Scream or Curse: Not on file    Family History  Problem Relation Age of Onset   Diabetes Mother    Hypertension Mother    Hypertension Father    Intracerebral hemorrhage Father    Asthma Brother    Stroke Maternal Grandmother    Diabetes Maternal Grandmother    Cancer Paternal Grandmother    Asthma Maternal Aunt    Diabetes Maternal Aunt    Cancer Paternal Aunt    Cancer Paternal Uncle    Diabetes Paternal Uncle    Asthma Other    Birth defects Neg Hx    Heart disease Neg Hx     Review of Systems:  As stated in the HPI and otherwise negative.   BP (!) 188/78   Pulse (!) 54   Ht 5\' 4"  (1.626 m)   Wt 107.2 kg   SpO2 98%   BMI 40.58 kg/m   Physical Examination: General: Well developed, well nourished, NAD  HEENT: OP clear, mucus membranes moist  SKIN: warm, dry. No rashes. Neuro: No focal deficits  Musculoskeletal: Muscle strength 5/5 all ext  Psychiatric: Mood and affect normal  Neck: No JVD, no carotid bruits, no thyromegaly, no lymphadenopathy.  Lungs:Clear bilaterally, no wheezes, rhonci, crackles Cardiovascular: Regular, bradycardic. No murmurs, gallops or rubs. Abdomen:Soft. Bowel sounds present. Non-tender.  Extremities: No lower extremity edema. Pulses are 2 + in the bilateral DP/PT.  EKG:  EKG is not ordered today. The ekg ordered today demonstrates   Recent Labs: 10/15/2023: BUN 9; Creatinine 0.7; Potassium 4.2; Sodium 139; TSH 1.65   Lipid Panel    Component Value Date/Time   CHOL 159 07/24/2023 1424   TRIG 102 07/24/2023 1424   HDL 46 (L) 07/24/2023 1424   CHOLHDL 3.5 07/24/2023 1424   VLDL 25 09/21/2011 1024   LDLCALC 93 07/24/2023 1424    LDLDIRECT 113 (H) 03/19/2013 1610     Wt Readings from Last 3 Encounters:  03/11/24 107.2 kg  10/24/23 112.2 kg  07/24/23 113.2 kg    Assessment and Plan:   1. Sinus bradycardia with incomplete RBBB: She has no worrisome symptoms. Will plan an echo to assess LV function and exclude structural heart disease. TSH normal in November 2024. No other cardiac testing is indicated.   Labs/ tests  ordered today include:  No orders of the defined types were placed in this encounter.  Disposition:   F/U with me as needed.    Signed, Verne Carrow, MD, [facility] 03/11/2024 2:32 PM    [facility] [address] [place], [place], [place]  [address] Phone: [phone]; Fax: [phone]

## 2024-03-24 ENCOUNTER — Ambulatory Visit: Admitting: Nurse Practitioner

## 2024-03-24 ENCOUNTER — Encounter: Payer: Self-pay | Admitting: Nurse Practitioner

## 2024-03-24 VITALS — Temp 98.5°F | Ht 64.0 in | Wt 238.6 lb

## 2024-03-24 DIAGNOSIS — I10 Essential (primary) hypertension: Secondary | ICD-10-CM

## 2024-03-24 DIAGNOSIS — E669 Obesity, unspecified: Secondary | ICD-10-CM

## 2024-03-24 DIAGNOSIS — R42 Dizziness and giddiness: Secondary | ICD-10-CM | POA: Diagnosis not present

## 2024-03-24 DIAGNOSIS — E1169 Type 2 diabetes mellitus with other specified complication: Secondary | ICD-10-CM

## 2024-03-24 DIAGNOSIS — E66813 Obesity, class 3: Secondary | ICD-10-CM

## 2024-03-24 DIAGNOSIS — Z2821 Immunization not carried out because of patient refusal: Secondary | ICD-10-CM

## 2024-03-24 DIAGNOSIS — F419 Anxiety disorder, unspecified: Secondary | ICD-10-CM

## 2024-03-24 DIAGNOSIS — K219 Gastro-esophageal reflux disease without esophagitis: Secondary | ICD-10-CM

## 2024-03-24 DIAGNOSIS — Z6841 Body Mass Index (BMI) 40.0 and over, adult: Secondary | ICD-10-CM

## 2024-03-24 MED ORDER — BUSPIRONE HCL 5 MG PO TABS
5.0000 mg | ORAL_TABLET | Freq: Two times a day (BID) | ORAL | 2 refills | Status: DC
Start: 2024-03-24 — End: 2024-07-09

## 2024-03-24 MED ORDER — OLMESARTAN MEDOXOMIL 20 MG PO TABS: ORAL_TABLET | ORAL | 1 refills | Status: DC

## 2024-03-24 NOTE — Progress Notes (Signed)
 [place], CMA,acting as a Neurosurgeon for Krista Epley, FNP.,have documented all relevant documentation on the behalf of Krista Epley, FNP,as directed by  Krista Epley, FNP while in the presence of Krista Epley, FNP.  Subjective:  Patient ID: Krista Adams , female    DOB: [birth year] , 38 y.o.   MRN: [id number]  Chief Complaint  Patient presents with   Hypertension    HPI  Patient presents today for a bp and dm follow up, Patient reports compliance with medication. Patient denies any chest pain, SOB, or headaches. Patient reports she has been having dizziness when she lays down. Patient reports about 2 times a week she feels the dizziness. She is now on Mounjaro 2.5 mg - tolerating well given by Dr. Ronelle Coffee.  She is under increased stress with her toddler and does not have help at home.   Diabetes She presents for her follow-up diabetic visit. She has type 2 diabetes mellitus. Her disease course has been improving. Hypoglycemia symptoms include headaches. Pertinent negatives for hypoglycemia include no dizziness or nervousness/anxiousness. Pertinent negatives for diabetes include no chest pain, no fatigue, no polydipsia, no polyphagia and no polyuria. There are no hypoglycemic complications. There are no diabetic complications. Risk factors for coronary artery disease include diabetes mellitus, obesity and sedentary lifestyle. Current diabetic treatment includes oral agent (dual therapy). She is compliant with treatment all of the time. She is following a generally unhealthy diet. When asked about meal planning, she reported none. She has not had a previous visit with a dietitian. She rarely participates in exercise. There is no change in her home blood glucose trend. An ACE inhibitor/angiotensin II receptor blocker is being taken. She does not see a podiatrist.Eye exam is not current.  Hypertension This is a chronic problem. The current episode started more than 1 year ago. The problem has been  gradually improving since onset. Associated symptoms include headaches. Pertinent negatives include no chest pain or palpitations. Risk factors for coronary artery disease include obesity and sedentary lifestyle. Compliance problems include exercise.  There is no history of chronic renal disease.  Headache  The pain is located in the Frontal region. The pain does not radiate. Pertinent negatives include no abdominal pain or dizziness. The symptoms are aggravated by bright light. She has tried acetaminophen  for the symptoms. Her past medical history is significant for hypertension and obesity. There is no history of cancer or migraine headaches.  Gastroesophageal Reflux She complains of belching. She reports no abdominal pain or no chest pain. This is a recurrent problem. The current episode started more than 1 month ago. The symptoms are aggravated by certain foods. Pertinent negatives include no fatigue. Risk factors include obesity and lack of exercise. She has tried a histamine-2 antagonist for the symptoms. The treatment provided no relief.     Past Medical History:  Diagnosis Date   Anovulatory (dysfunctional uterine) bleeding 03/13/2012   Depression with anxiety 03/18/2012   DM (diabetes mellitus), type 2, uncontrolled 05/06/2008   Qualifier: Diagnosis of  By: Redia Candela  MD, Taineisha     Essential hypertension, benign 05/06/2008   Qualifier: Diagnosis of  By: Johnna Nakai MD, Nathan     Morbid obesity (HCC) 05/06/2008   Qualifier: Diagnosis of  By: Johnna Nakai MD, Elana Grayer       Family History  Problem Relation Age of Onset   Diabetes Mother    Hypertension Mother    Hypertension Father    Intracerebral hemorrhage Father    Asthma Brother  Stroke Maternal Grandmother    Diabetes Maternal Grandmother    Cancer Paternal Grandmother    Asthma Maternal Aunt    Diabetes Maternal Aunt    Cancer Paternal Aunt    Cancer Paternal Uncle    Diabetes Paternal Uncle    Asthma Other    Birth defects  Neg Hx    Heart disease Neg Hx      Current Outpatient Medications:    ACCU-CHEK AVIVA PLUS test strip, USE TO CHECK BLOOD SUGARS TWICE DAILY E11.69, Disp: 100 strip, Rfl: 2   blood glucose meter kit and supplies KIT, Dispense based on patient and insurance preference. Use up to four times daily as directed. (FOR ICD-9 250.00, 250.01)., Disp: 1 each, Rfl: 0   busPIRone  (BUSPAR ) 5 MG tablet, Take 1 tablet (5 mg total) by mouth 2 (two) times daily., Disp: 60 tablet, Rfl: 2   cyclobenzaprine  (FLEXERIL ) 10 MG tablet, Take 1 tablet (10 mg total) by mouth 3 (three) times daily as needed for muscle spasms., Disp: 30 tablet, Rfl: 0   insulin  aspart (NOVOLOG ) 100 UNIT/ML injection, Inject into the skin 3 (three) times daily before meals., Disp: , Rfl:    insulin  degludec (TRESIBA  FLEXTOUCH) 100 UNIT/ML FlexTouch Pen, Inject 16 Units into the skin at bedtime., Disp: , Rfl:    MOUNJARO 2.5 MG/0.5ML Pen, SMARTSIG:2.5 Milligram(s) SUB-Q Every 4 Weeks, Disp: , Rfl:    naproxen  (NAPROSYN ) 500 MG tablet, Take 1 tab by mouth twice a day for 5 days then daily as needed., Disp: 60 tablet, Rfl: 2   omeprazole  (PRILOSEC ) 20 MG capsule, TAKE 1 CAPSULE BY MOUTH EVERY DAY, Disp: 90 capsule, Rfl: 1   Ubrogepant  (UBRELVY ) 50 MG TABS, Take 1 tablet (50 mg total) by mouth daily., Disp: 16 tablet, Rfl: 1   olmesartan  (BENICAR ) 20 MG tablet, Take 1/2 tabs daily by mouth, Disp: 90 tablet, Rfl: 1   Allergies  Allergen Reactions   Pineapple     itching     Review of Systems  Constitutional:  Negative for fatigue.  Respiratory: Negative.    Cardiovascular:  Negative for chest pain, palpitations and leg swelling.  Gastrointestinal:  Negative for abdominal pain.  Endocrine: Negative for polydipsia, polyphagia and polyuria.  Neurological:  Positive for headaches. Negative for dizziness.  Psychiatric/Behavioral:  The patient is not nervous/anxious.      Today's Vitals   03/24/24 1129  Temp: 98.5 F (36.9 C)   TempSrc: Oral  Weight: 238 lb 9.6 oz (108.2 kg)  Height: 5\' 4"  (1.626 m)  PainSc: 0-No pain   Body mass index is 40.96 kg/m.  Wt Readings from Last 3 Encounters:  03/24/24 238 lb 9.6 oz (108.2 kg)  03/11/24 236 lb 6.4 oz (107.2 kg)  10/24/23 247 lb 6.4 oz (112.2 kg)    No data found.  Objective:  Physical Exam Vitals and nursing note reviewed.  Constitutional:      Appearance: Normal appearance.  Cardiovascular:     Rate and Rhythm: Normal rate and regular rhythm.     Pulses: Normal pulses.     Heart sounds: Normal heart sounds. No murmur heard. Pulmonary:     Effort: Pulmonary effort is normal. No respiratory distress.     Breath sounds: Normal breath sounds. No wheezing.  Musculoskeletal:        General: No signs of injury.  Skin:    General: Skin is warm and dry.     Capillary Refill: Capillary refill takes less than 2 seconds.  Neurological:     General: No focal deficit present.     Mental Status: She is alert and oriented to person, place, and time.     Cranial Nerves: No cranial nerve deficit.     Motor: No weakness.  Psychiatric:        Mood and Affect: Mood normal.        Behavior: Behavior normal.        Thought Content: Thought content normal.        Judgment: Judgment normal.         Assessment And Plan:  Essential hypertension, benign Assessment & Plan: Blood pressure is fairly controlled. Continue current medications  Orders: -     Olmesartan  Medoxomil; Take 1/2 tabs daily by mouth  Dispense: 90 tablet; Refill: 1 -     Basic metabolic panel with GFR  Type 2 diabetes mellitus with obesity (HCC) Assessment & Plan: Continue f/u with Endocrinology, she has not had her A1c checked recently, will check today and send to Dr. Ronelle Coffee   Orders: -     Hemoglobin A1c -     Basic metabolic panel with GFR  COVID-19 vaccination declined Assessment & Plan: Declines covid 19 vaccine. Discussed risk of covid 72 and if she changes her mind about the vaccine  to call the office. Education has been provided regarding the importance of this vaccine but patient still declined. Advised may receive this vaccine at local pharmacy or Health Dept.or vaccine clinic. Aware to provide a copy of the vaccination record if obtained from local pharmacy or Health Dept.  Encouraged to take multivitamin, vitamin d , vitamin c and zinc to increase immune system. Aware can call office if would like to have vaccine here at office. Verbalized acceptance and understanding.    Dizziness Assessment & Plan: Gennaro Khat are normal. Stay well hydrated with water .    Class 3 severe obesity due to excess calories without serious comorbidity with body mass index (BMI) of 40.0 to 44.9 in adult Assessment & Plan: She is encouraged to strive for BMI less than 30 to decrease cardiac risk. Advised to aim for at least 150 minutes of exercise per week.    Gastroesophageal reflux disease without esophagitis Assessment & Plan: Continue current medications.   Anxiety Assessment & Plan: She is ready to start a medication that will help. RTO in 6 weeks for f/u  Orders: -     busPIRone  HCl; Take 1 tablet (5 mg total) by mouth 2 (two) times daily.  Dispense: 60 tablet; Refill: 2    Return for controlled DM check 4 months; 2 week NV BP check; 6-8 week buspirone  f/u.  Patient was given opportunity to ask questions. Patient verbalized understanding of the plan and was able to repeat key elements of the plan. All questions were answered to their satisfaction.     Inge Mangle, FNP, have reviewed all documentation for this visit. The documentation on 03/24/24 for the exam, diagnosis, procedures, and orders are all accurate and complete.  IF YOU HAVE BEEN REFERRED TO A SPECIALIST, IT MAY TAKE 1-2 WEEKS TO SCHEDULE/PROCESS THE REFERRAL. IF YOU HAVE NOT HEARD FROM US /SPECIALIST IN TWO WEEKS, PLEASE GIVE US  A CALL AT [phone].

## 2024-03-25 ENCOUNTER — Encounter: Payer: Self-pay | Admitting: Nurse Practitioner

## 2024-03-25 LAB — BASIC METABOLIC PANEL WITH GFR
BUN: 12 mg/dL (ref 7–25)
CO2: 27 mmol/L (ref 20–32)
Calcium: 8.9 mg/dL (ref 8.6–10.2)
Chloride: 109 mmol/L (ref 98–110)
Creat: 0.68 mg/dL (ref 0.50–0.97)
Glucose, Bld: 98 mg/dL (ref 65–99)
Potassium: 4 mmol/L (ref 3.5–5.3)
Sodium: 141 mmol/L (ref 135–146)
eGFR: 114 mL/min/{1.73_m2} (ref >=60)

## 2024-03-25 LAB — HEMOGLOBIN A1C
Hgb A1c MFr Bld: 6.2 % — ABNORMAL HIGH (ref <5.7)
Mean Plasma Glucose: 131 mg/dL
eAG (mmol/L): 7.3 mmol/L

## 2024-04-06 DIAGNOSIS — F419 Anxiety disorder, unspecified: Secondary | ICD-10-CM | POA: Insufficient documentation

## 2024-04-06 DIAGNOSIS — R42 Dizziness and giddiness: Secondary | ICD-10-CM | POA: Insufficient documentation

## 2024-04-06 NOTE — Assessment & Plan Note (Signed)
Blood pressure is fairly controlled. Continue current medications

## 2024-04-06 NOTE — Assessment & Plan Note (Signed)
 Continue f/u with Endocrinology, she has not had her A1c checked recently, will check today and send to Dr. Balan

## 2024-04-06 NOTE — Assessment & Plan Note (Signed)

## 2024-04-06 NOTE — Assessment & Plan Note (Signed)
 She is ready to start a medication that will help. RTO in 6 weeks for f/u

## 2024-04-06 NOTE — Assessment & Plan Note (Signed)
 Orthos are normal. Stay well hydrated with water .

## 2024-04-06 NOTE — Assessment & Plan Note (Signed)
 Continue current medications.

## 2024-04-06 NOTE — Assessment & Plan Note (Signed)
 She is encouraged to strive for BMI less than 30 to decrease cardiac risk. Advised to aim for at least 150 minutes of exercise per week.

## 2024-04-07 ENCOUNTER — Ambulatory Visit (INDEPENDENT_AMBULATORY_CARE_PROVIDER_SITE_OTHER): Payer: Self-pay | Admitting: Internal Medicine

## 2024-04-07 ENCOUNTER — Encounter: Payer: Self-pay | Admitting: Internal Medicine

## 2024-04-07 VITALS — BP 140/80 | HR 54 | Temp 98.0°F | Ht 64.0 in | Wt 243.0 lb

## 2024-04-07 DIAGNOSIS — E1169 Type 2 diabetes mellitus with other specified complication: Secondary | ICD-10-CM | POA: Diagnosis not present

## 2024-04-07 DIAGNOSIS — E669 Obesity, unspecified: Secondary | ICD-10-CM

## 2024-04-07 DIAGNOSIS — R300 Dysuria: Secondary | ICD-10-CM | POA: Diagnosis not present

## 2024-04-07 DIAGNOSIS — I10 Essential (primary) hypertension: Secondary | ICD-10-CM

## 2024-04-07 LAB — POCT URINALYSIS DIPSTICK
Bilirubin, UA: NEGATIVE
Glucose, UA: NEGATIVE
Ketones, UA: NEGATIVE
Nitrite, UA: NEGATIVE
Protein, UA: NEGATIVE
Spec Grav, UA: 1.015 (ref 1.010–1.025)
Urobilinogen, UA: 0.2 U/dL
pH, UA: 7 (ref 5.0–8.0)

## 2024-04-07 MED ORDER — OLMESARTAN MEDOXOMIL 40 MG PO TABS: 40.0000 mg | ORAL_TABLET | Freq: Every day | ORAL | 11 refills | Status: AC

## 2024-04-07 MED ORDER — MOUNJARO 5 MG/0.5ML ~~LOC~~ SOAJ: 5.0000 mg | SUBCUTANEOUS | 0 refills | Status: DC

## 2024-04-07 MED ORDER — NITROFURANTOIN MONOHYD MACRO 100 MG PO CAPS: 100.0000 mg | ORAL_CAPSULE | Freq: Two times a day (BID) | ORAL | 0 refills | Status: AC

## 2024-04-07 NOTE — Progress Notes (Unsigned)
 I,Krista Adams, CMA,acting as a Neurosurgeon for Krista Dung, MD.,have documented all relevant documentation on the behalf of Krista Dung, MD,as directed by  Krista Dung, MD while in the presence of Krista Dung, MD.  Subjective:  Patient ID: Krista Adams , female    DOB: [birth year] , 38 y.o.   MRN: [id number]  Chief Complaint  Patient presents with   Urinary Frequency    Patient presents today for possible UTI. She experiences pressure after urination, burning, bloated.     HPI Discussed the use of AI scribe software for clinical note transcription with the patient, who gave verbal consent to proceed.  History of Present Illness BRENEE Adams is a 38 year old female with hypertension who presents with urinary symptoms.   She experiences a burning sensation and pressure after urination, along with increased frequency of urination. A recent urinalysis showed trace leukocytes and blood, and she is not currently menstruating. She has tried Azo for relief, but it did not alleviate her symptoms.  She has a history of hypertension and is currently taking olmesartan , one and a half tablets as previously instructed. Despite regular medication adherence, her blood pressure has been elevated recently. She is also on Mounjaro 2.5 mg, which was supposed to be increased to 5 mg, but there was a pharmacy issue with the prescription update.  She is allergic to pineapples and has no known medication allergies. Her weight has increased by seven pounds over the past month. She exercises regularly and has good insurance coverage through her husband.   Hypertension This is a chronic problem. The current episode started more than 1 year ago. Pertinent negatives include no blurred vision or chest pain. Past treatments include angiotensin blockers. The current treatment provides moderate improvement.     Past Medical History:  Diagnosis Date   Anovulatory (dysfunctional uterine) bleeding  03/13/2012   Depression with anxiety 03/18/2012   DM (diabetes mellitus), type 2, uncontrolled 05/06/2008   Qualifier: Diagnosis of  By: Redia Candela  MD, Taineisha     Essential hypertension, benign 05/06/2008   Qualifier: Diagnosis of  By: Johnna Nakai MD, Nathan     Morbid obesity (HCC) 05/06/2008   Qualifier: Diagnosis of  By: Johnna Nakai MD, Elana Grayer       Family History  Problem Relation Age of Onset   Diabetes Mother    Hypertension Mother    Hypertension Father    Intracerebral hemorrhage Father    Asthma Brother    Stroke Maternal Grandmother    Diabetes Maternal Grandmother    Cancer Paternal Grandmother    Asthma Maternal Aunt    Diabetes Maternal Aunt    Cancer Paternal Aunt    Cancer Paternal Uncle    Diabetes Paternal Uncle    Asthma Other    Birth defects Neg Hx    Heart disease Neg Hx      Current Outpatient Medications:    ACCU-CHEK AVIVA PLUS test strip, USE TO CHECK BLOOD SUGARS TWICE DAILY E11.69, Disp: 100 strip, Rfl: 2   blood glucose meter kit and supplies KIT, Dispense based on patient and insurance preference. Use up to four times daily as directed. (FOR ICD-9 250.00, 250.01)., Disp: 1 each, Rfl: 0   busPIRone  (BUSPAR ) 5 MG tablet, Take 1 tablet (5 mg total) by mouth 2 (two) times daily., Disp: 60 tablet, Rfl: 2   cyclobenzaprine  (FLEXERIL ) 10 MG tablet, Take 1 tablet (10 mg total) by mouth 3 (three) times daily as needed  for muscle spasms., Disp: 30 tablet, Rfl: 0   insulin  aspart (NOVOLOG ) 100 UNIT/ML injection, Inject into the skin 3 (three) times daily before meals., Disp: , Rfl:    insulin  degludec (TRESIBA  FLEXTOUCH) 100 UNIT/ML FlexTouch Pen, Inject 16 Units into the skin at bedtime., Disp: , Rfl:    naproxen  (NAPROSYN ) 500 MG tablet, Take 1 tab by mouth twice a day for 5 days then daily as needed., Disp: 60 tablet, Rfl: 2   nitrofurantoin , macrocrystal-monohydrate, (MACROBID ) 100 MG capsule, Take 1 capsule (100 mg total) by mouth 2 (two) times daily for 5 days.,  Disp: 10 capsule, Rfl: 0   olmesartan  (BENICAR ) 40 MG tablet, Take 1 tablet (40 mg total) by mouth daily., Disp: 30 tablet, Rfl: 11   omeprazole  (PRILOSEC ) 20 MG capsule, TAKE 1 CAPSULE BY MOUTH EVERY DAY, Disp: 90 capsule, Rfl: 1   tirzepatide (MOUNJARO) 5 MG/0.5ML Pen, Inject 5 mg into the skin once a week., Disp: 2 mL, Rfl: 0   Ubrogepant  (UBRELVY ) 50 MG TABS, Take 1 tablet (50 mg total) by mouth daily., Disp: 16 tablet, Rfl: 1   Allergies  Allergen Reactions   Pineapple     itching     Review of Systems  Constitutional: Negative.   Eyes:  Negative for blurred vision.  Respiratory: Negative.    Cardiovascular: Negative.  Negative for chest pain.  Gastrointestinal: Negative.   Neurological: Negative.   Psychiatric/Behavioral: Negative.       Today's Vitals   04/07/24 1140 04/07/24 1207  BP: (!) 160/80 (!) 140/80  Pulse: (!) 54   Temp: 98 F (36.7 C)   SpO2: 98%   Weight: 243 lb (110.2 kg)   Height: 5\' 4"  (1.626 m)    Body mass index is 41.71 kg/m.  Wt Readings from Last 3 Encounters:  04/07/24 243 lb (110.2 kg)  03/24/24 238 lb 9.6 oz (108.2 kg)  03/11/24 236 lb 6.4 oz (107.2 kg)    BP Readings from Last 3 Encounters:  04/07/24 (!) 140/80  03/11/24 (!) 188/78  10/24/23 (!) 140/80    Objective:  Physical Exam Vitals and nursing note reviewed.  Constitutional:      Appearance: Normal appearance. She is obese.  HENT:     Head: Normocephalic and atraumatic.  Eyes:     Extraocular Movements: Extraocular movements intact.  Cardiovascular:     Rate and Rhythm: Normal rate and regular rhythm.     Heart sounds: Normal heart sounds.  Pulmonary:     Effort: Pulmonary effort is normal.     Breath sounds: Normal breath sounds.  Musculoskeletal:     Cervical back: Normal range of motion.  Skin:    General: Skin is warm.  Neurological:     General: No focal deficit present.     Mental Status: She is alert.  Psychiatric:        Mood and Affect: Mood normal.         Behavior: Behavior normal.       Assessment And Plan:  Dysuria Assessment & Plan: Symptoms and urinalysis confirm UTI. No menstruation to explain hematuria. - Prescribe antibiotic twice daily for five days. - Recommend Azo for urinary discomfort.  Orders: -     POCT urinalysis dipstick  Essential hypertension Assessment & Plan: Hypertension persists despite olmesartan . Increased dosage planned. Discussed medication adherence and potential need for additional antihypertensive. Olmesartan  contraindicated in pregnancy. - Increase olmesartan  to 40 mg daily. - Schedule nurse visit in two weeks for blood pressure  and kidney function check. - Send new prescription for olmesartan  40 mg to pharmacy. - Consider adding nighttime antihypertensive if blood pressure remains high.  Orders: -     BMP8+EGFR; Future  Type 2 diabetes mellitus with obesity (HCC) Assessment & Plan: Chronic, currently on Mounjaro 2.5mg  weekly. Obesity with recent weight gain. Mounjaro approved by insurance. Plan to increase dosage for enhanced weight loss. A1c well-controlled. - Increase Mounjaro to 5 mg. - Send prescription for Mounjaro 5 mg to pharmacy. - F/u as scheduled with Abelino Able, NP.    Other orders -     Olmesartan  Medoxomil; Take 1 tablet (40 mg total) by mouth daily.  Dispense: 30 tablet; Refill: 11 -     Mounjaro; Inject 5 mg into the skin once a week.  Dispense: 2 mL; Refill: 0 -     Nitrofurantoin  Monohyd Macro; Take 1 capsule (100 mg total) by mouth 2 (two) times daily for 5 days.  Dispense: 10 capsule; Refill: 0  Return in 2 weeks (on 04/21/2024), or bp check, NV and LAB.  Patient was given opportunity to ask questions. Patient verbalized understanding of the plan and was able to repeat key elements of the plan. All questions were answered to their satisfaction.    I, Krista Dung, MD, have reviewed all documentation for this visit. The documentation on 04/07/24 for the exam, diagnosis,  procedures, and orders are all accurate and complete.   IF YOU HAVE BEEN REFERRED TO A SPECIALIST, IT MAY TAKE 1-2 WEEKS TO SCHEDULE/PROCESS THE REFERRAL. IF YOU HAVE NOT HEARD FROM US /SPECIALIST IN TWO WEEKS, PLEASE GIVE US  A CALL AT [phone].   THE PATIENT IS ENCOURAGED TO PRACTICE SOCIAL DISTANCING DUE TO THE COVID-19 PANDEMIC.

## 2024-04-07 NOTE — Patient Instructions (Signed)

## 2024-04-12 NOTE — Assessment & Plan Note (Signed)
 Symptoms and urinalysis confirm UTI. No menstruation to explain hematuria. - Prescribe antibiotic twice daily for five days. - Recommend Azo for urinary discomfort.

## 2024-04-12 NOTE — Assessment & Plan Note (Signed)
 Chronic, currently on Mounjaro 2.5mg  weekly. Obesity with recent weight gain. Mounjaro approved by insurance. Plan to increase dosage for enhanced weight loss. A1c well-controlled. - Increase Mounjaro to 5 mg. - Send prescription for Mounjaro 5 mg to pharmacy. - F/u as scheduled with Abelino Able, NP.

## 2024-04-12 NOTE — Assessment & Plan Note (Signed)
 Hypertension persists despite olmesartan . Increased dosage planned. Discussed medication adherence and potential need for additional antihypertensive. Olmesartan  contraindicated in pregnancy. - Increase olmesartan  to 40 mg daily. - Schedule nurse visit in two weeks for blood pressure and kidney function check. - Send new prescription for olmesartan  40 mg to pharmacy. - Consider adding nighttime antihypertensive if blood pressure remains high.

## 2024-04-21 ENCOUNTER — Ambulatory Visit (HOSPITAL_COMMUNITY)
Admission: RE | Admit: 2024-04-21 | Discharge: 2024-04-21 | Disposition: A | Discharge status: Home / Self Care | Source: Ambulatory Visit | Attending: Cardiology | Admitting: Cardiology

## 2024-04-21 DIAGNOSIS — I371 Nonrheumatic pulmonary valve insufficiency: Secondary | ICD-10-CM

## 2024-04-21 DIAGNOSIS — R001 Bradycardia, unspecified: Secondary | ICD-10-CM | POA: Insufficient documentation

## 2024-04-21 LAB — ECHOCARDIOGRAM COMPLETE
Area-P 1/2: 3.88 cm2
S' Lateral: 2.7 cm

## 2024-04-22 ENCOUNTER — Ambulatory Visit: Payer: Self-pay | Admitting: Cardiovascular Disease

## 2024-05-01 ENCOUNTER — Ambulatory Visit

## 2024-05-01 VITALS — BP 138/70 | HR 54 | Temp 98.4°F | Ht 64.0 in | Wt 243.0 lb

## 2024-05-01 DIAGNOSIS — I10 Essential (primary) hypertension: Secondary | ICD-10-CM

## 2024-05-01 NOTE — Patient Instructions (Signed)
 Hypertension, Adult Hypertension is another name for high blood pressure. High blood pressure forces your heart to work harder to pump blood. This can cause problems over time. There are two numbers in a blood pressure reading. There is a top number (systolic) over a bottom number (diastolic). It is best to have a blood pressure that is below 120/80. What are the causes? The cause of this condition is not known. Some other conditions can lead to high blood pressure. What increases the risk? Some lifestyle factors can make you more likely to develop high blood pressure: Smoking. Not getting enough exercise or physical activity. Being overweight. Having too much fat, sugar, calories, or salt (sodium) in your diet. Drinking too much alcohol. Other risk factors include: Having any of these conditions: Heart disease. Diabetes. High cholesterol. Kidney disease. Obstructive sleep apnea. Having a family history of high blood pressure and high cholesterol. Age. The risk increases with age. Stress. What are the signs or symptoms? High blood pressure may not cause symptoms. Very high blood pressure (hypertensive crisis) may cause: Headache. Fast or uneven heartbeats (palpitations). Shortness of breath. Nosebleed. Vomiting or feeling like you may vomit (nauseous). Changes in how you see. Very bad chest pain. Feeling dizzy. Seizures. How is this treated? This condition is treated by making healthy lifestyle changes, such as: Eating healthy foods. Exercising more. Drinking less alcohol. Your doctor may prescribe medicine if lifestyle changes do not help enough and if: Your top number is above 130. Your bottom number is above 80. Your personal target blood pressure may vary. Follow these instructions at home: Eating and drinking  If told, follow the DASH eating plan. To follow this plan: Fill one half of your plate at each meal with fruits and vegetables. Fill one fourth of your plate  at each meal with whole grains. Whole grains include whole-wheat pasta, brown rice, and whole-grain bread. Eat or drink low-fat dairy products, such as skim milk or low-fat yogurt. Fill one fourth of your plate at each meal with low-fat (lean) proteins. Low-fat proteins include fish, chicken without skin, eggs, beans, and tofu. Avoid fatty meat, cured and processed meat, or chicken with skin. Avoid pre-made or processed food. Limit the amount of salt in your diet to less than 1,500 mg each day. Do not drink alcohol if: Your doctor tells you not to drink. You are pregnant, may be pregnant, or are planning to become pregnant. If you drink alcohol: Limit how much you have to: 0-1 drink a day for women. 0-2 drinks a day for men. Know how much alcohol is in your drink. In the [place] one drink equals one 12 oz bottle of beer (355 mL), one 5 oz glass of wine (148 mL), or one 1 oz glass of hard liquor (44 mL). Lifestyle  Work with your doctor to stay at a healthy weight or to lose weight. Ask your doctor what the best weight is for you. Get at least 30 minutes of exercise that causes your heart to beat faster (aerobic exercise) most days of the week. This may include walking, swimming, or biking. Get at least 30 minutes of exercise that strengthens your muscles (resistance exercise) at least 3 days a week. This may include lifting weights or doing Pilates. Do not smoke or use any products that contain nicotine or tobacco. If you need help quitting, ask your doctor. Check your blood pressure at home as told by your doctor. Keep all follow-up visits. Medicines Take over-the-counter and prescription medicines  only as told by your doctor. Follow directions carefully. Do not skip doses of blood pressure medicine. The medicine does not work as well if you skip doses. Skipping doses also puts you at risk for problems. Ask your doctor about side effects or reactions to medicines that you should watch  for. Contact a doctor if: You think you are having a reaction to the medicine you are taking. You have headaches that keep coming back. You feel dizzy. You have swelling in your ankles. You have trouble with your vision. Get help right away if: You get a very bad headache. You start to feel mixed up (confused). You feel weak or numb. You feel faint. You have very bad pain in your: Chest. Belly (abdomen). You vomit more than once. You have trouble breathing. These symptoms may be an emergency. Get help right away. Call 911. Do not wait to see if the symptoms will go away. Do not drive yourself to the hospital. Summary Hypertension is another name for high blood pressure. High blood pressure forces your heart to work harder to pump blood. For most people, a normal blood pressure is less than 120/80. Making healthy choices can help lower blood pressure. If your blood pressure does not get lower with healthy choices, you may need to take medicine. This information is not intended to replace advice given to you by your health care provider. Make sure you discuss any questions you have with your health care provider. Document Revised: 09/07/2021 Document Reviewed: 09/07/2021 [employer]vier Patient Education  2024 [employer].

## 2024-05-01 NOTE — Progress Notes (Signed)
 Patient presents today for a bpc. Patient reports compliance with her meds. She is taking olmesartan  40mg  in the mornings. I checked patient's blood pressure and it is 154/72 P54 I had patient wait 10 minutes and rechecked and it was 138/70 P54. Patient advised to continue with her current medications and keep her next appointment as scheduled. YL,RMA     BP Readings from Last 3 Encounters:  04/07/24 (!) 140/80  03/11/24 (!) 188/78  10/24/23 (!) 140/80

## 2024-05-05 ENCOUNTER — Other Ambulatory Visit

## 2024-05-05 DIAGNOSIS — I10 Essential (primary) hypertension: Secondary | ICD-10-CM

## 2024-05-05 DIAGNOSIS — Z111 Encounter for screening for respiratory tuberculosis: Secondary | ICD-10-CM

## 2024-05-06 ENCOUNTER — Ambulatory Visit: Payer: Self-pay | Admitting: Nurse Practitioner

## 2024-05-06 LAB — QUANTIFERON-TB GOLD PLUS,4 TUBES,DRAW SITE INCUBATED
Mitogen-NIL: 0.01 [IU]/mL
NIL: 0.01 [IU]/mL
QuantiFERON-TB Gold Plus,4 T,Incubated: UNDETERMINED — AB
TB1-NIL: 0 [IU]/mL
TB2-NIL: 0 [IU]/mL

## 2024-05-06 LAB — BASIC METABOLIC PANEL WITH GFR
BUN: 14 mg/dL (ref 7–25)
CO2: 23 mmol/L (ref 20–32)
Calcium: 9.1 mg/dL (ref 8.6–10.2)
Chloride: 107 mmol/L (ref 98–110)
Creat: 0.76 mg/dL (ref 0.50–0.99)
Glucose, Bld: 115 mg/dL — ABNORMAL HIGH (ref 65–99)
Potassium: 4.2 mmol/L (ref 3.5–5.3)
Sodium: 138 mmol/L (ref 135–146)
eGFR: 102 mL/min/{1.73_m2} (ref >=60)

## 2024-06-07 ENCOUNTER — Other Ambulatory Visit: Payer: Self-pay | Admitting: Nurse Practitioner

## 2024-06-07 DIAGNOSIS — K219 Gastro-esophageal reflux disease without esophagitis: Secondary | ICD-10-CM

## 2024-06-08 ENCOUNTER — Other Ambulatory Visit: Payer: Self-pay | Admitting: Internal Medicine

## 2024-07-09 ENCOUNTER — Other Ambulatory Visit: Payer: Self-pay | Admitting: Nurse Practitioner

## 2024-07-09 DIAGNOSIS — F419 Anxiety disorder, unspecified: Secondary | ICD-10-CM

## 2024-08-04 ENCOUNTER — Ambulatory Visit: Admitting: Nurse Practitioner

## 2024-09-29 ENCOUNTER — Ambulatory Visit: Admitting: Dermatology
